# Patient Record
Sex: Male | Born: 1950
Health system: Southern US, Community
[De-identification: ages and names within clinical notes are randomized; demographics above are authoritative.]

## PROBLEM LIST (undated history)

## (undated) DIAGNOSIS — I82409 Acute embolism and thrombosis of unspecified deep veins of unspecified lower extremity: Secondary | ICD-10-CM

## (undated) DIAGNOSIS — I10 Essential (primary) hypertension: Secondary | ICD-10-CM

## (undated) DIAGNOSIS — M1990 Unspecified osteoarthritis, unspecified site: Secondary | ICD-10-CM

## (undated) DIAGNOSIS — I639 Cerebral infarction, unspecified: Secondary | ICD-10-CM

## (undated) DIAGNOSIS — E785 Hyperlipidemia, unspecified: Secondary | ICD-10-CM

## (undated) DIAGNOSIS — I739 Peripheral vascular disease, unspecified: Secondary | ICD-10-CM

## (undated) DIAGNOSIS — I6529 Occlusion and stenosis of unspecified carotid artery: Secondary | ICD-10-CM

## (undated) HISTORY — DX: Occlusion and stenosis of unspecified carotid artery: I65.29

## (undated) HISTORY — PX: HERNIA REPAIR: SHX51

## (undated) HISTORY — DX: Essential (primary) hypertension: I10

## (undated) HISTORY — DX: Cerebral infarction, unspecified: I63.9

---

## 2002-05-01 ENCOUNTER — Ambulatory Visit (HOSPITAL_COMMUNITY): Admission: RE | Admit: 2002-05-01 | Discharge: 2002-05-01 | Payer: Self-pay | Admitting: Gastroenterology

## 2002-07-10 ENCOUNTER — Ambulatory Visit (HOSPITAL_COMMUNITY): Admission: RE | Admit: 2002-07-10 | Discharge: 2002-07-10 | Payer: Self-pay | Admitting: Gastroenterology

## 2002-12-10 ENCOUNTER — Ambulatory Visit (HOSPITAL_COMMUNITY): Admission: RE | Admit: 2002-12-10 | Discharge: 2002-12-10 | Payer: Self-pay | Admitting: Family Medicine

## 2002-12-10 ENCOUNTER — Encounter: Payer: Self-pay | Admitting: Family Medicine

## 2008-07-10 ENCOUNTER — Emergency Department (HOSPITAL_COMMUNITY): Admission: EM | Admit: 2008-07-10 | Discharge: 2008-07-10 | Payer: Self-pay | Admitting: Emergency Medicine

## 2010-08-27 NOTE — Op Note (Signed)
NAME:  Ryan Franklin, Ryan Franklin                        ACCOUNT NO.:  [id number]   MEDICAL RECORD NO.:  [id number]                   PATIENT TYPE:  AMB   LOCATION:  ENDO                                 FACILITY:  [facility]   PHYSICIAN:  Bernette Redbird, M.D.                DATE OF BIRTH:  01/13/1951   DATE OF PROCEDURE:  05/01/2002  DATE OF DISCHARGE:                                 OPERATIVE REPORT   PROCEDURE:  Upper endoscopy with biopsies.   INDICATIONS:  This is a 60 year old gentleman with a several-day history of  melanic stools in association with daily use of small to moderate amounts of  Excedrin.   FINDINGS:  Tiny amount of fresh blood in the stomach with apparent localized  hemorrhagic gastritis.  Small antral ulcer.   DESCRIPTION OF PROCEDURE:  The nature, purpose, and risks of the procedure  had been discussed with the patient, who had also watched our endoscopy  movie at our office before coming over to the endoscopy unit, where I  explained the risks.  Sedation was Demerol 75 mg, Versed 7.5 mg IV, without  arrhythmias or desaturation.  The Olympus small-caliber adult video  endoscope was passed under direct vision, entering the esophagus easily.  The larynx and vocal cords looked normal.   The esophageal mucosa was normal.  There was no evidence of any reflux  esophagitis, Barrett's esophagus, varices, infection, neoplasia, or any  ring, stricture, or Mallory-Weiss tear.  No hiatal hernia was appreciated.  The stomach contained no significant residual blood or coffee-grounds  material, but there was just a little patch of fresh blood measuring about a  centimeter across, which rinsed off the posterior antral wall.  The  underlying mucosa appeared slightly edematous and/or inflamed, possibly  consistent with some localized hemorrhagic gastritis.  However, there was no  reaccumulation of blood following rinsing, so there was no evidence of  active bleeding.   Also in the  antrum of the stomach was a linear slit-like ulcer  longitudinally-oriented in the antrum, measuring about 1 x 10 mm in size.  The base appeared entirely clean without any stigma of hemorrhage.  There  was no diffuse gastritis.  No other ulcers or erosions were observed.  Retroflex viewing of the proximal stomach was unremarkable.   The pylorus, duodenal bulb, and second duodenum looked normal.  Careful  reinspection of the duodenal bulb did not disclose any evidence of ulcers.   Antral biopsies were obtained for CLOtesting prior to removal of the scope.  The patient tolerated the procedure well, and There were no intraoperative  complications.   IMPRESSION:  1. No significant active bleeding at the time of this exam.  2. Possible hemorrhagic gastritis in the antrum of the stomach as described     above.  3. Small linear ulcer in the antrum of the stomach without stigma of     hemorrhage.  PLAN:  Intensive antipeptic therapy for the next five days or so, then  standard PPI therapy.  Follow-up endoscopy in a couple of months could be  considered.                                               Bernette Redbird, M.D.    RB/MEDQ  D:  05/01/2002  T:  05/01/2002  Job:  [id number]   cc:   Holley Bouche, M.D.  [address]  [place], [place] [address]  Fax: [phone]

## 2010-08-27 NOTE — Op Note (Signed)
   NAME:  Ryan Franklin, Ryan Franklin                        ACCOUNT NO.:  [id number]   MEDICAL RECORD NO.:  [id number]                   PATIENT TYPE:  AMB   LOCATION:  ENDO                                 FACILITY:  [facility]   PHYSICIAN:  Bernette Redbird, M.D.                DATE OF BIRTH:  08-20-1950   DATE OF PROCEDURE:  07/10/2002  DATE OF DISCHARGE:                                 OPERATIVE REPORT   PROCEDURE:  Colonoscopy.   INDICATIONS:  Screening for colon cancer in a 60 year old male with no  symptoms.   FINDINGS:  Normal exam to the cecum.   DESCRIPTION OF PROCEDURE:  The nature, purpose, and risks of the procedure  had been discussed with the patient, who provided written consent.  Sedation  for this procedure and the upper endoscopy which preceded it totalled  fentanyl 100 mcg and Versed 9 mg IV without arrhythmias or desaturation.  Digital exam of the prostate was normal.  The Olympus adult video  colonoscope was readily advanced to the cecum as identified by clear  visualization of the appendiceal orifice, and pullback was then performed.  The quality of the prep was excellent with just a little bit of stool film  in the proximal colon, which was largely irrigated away, so it is felt that  all areas were adequately seen.  This was a normal examination.  No polyps,  cancer, colitis, vascular malformations, or diverticulosis were noted, and  retroflexion in the rectum as well as reinspection of the rectosigmoid was  unremarkable.  No biopsies were obtained.  The patient tolerated the  procedure well, and there were no apparent complications.   IMPRESSION:  Normal screening colonoscopy.   PLAN:  Consider repeat exam, either flexible sigmoidoscopy or full  colonoscopy, in about five years.                                               Bernette Redbird, M.D.    RB/MEDQ  D:  07/10/2002  T:  07/10/2002  Job:  [id number]   cc:   Holley Bouche, M.D.  [address]  [place], [place] [address]  Fax: [phone]

## 2010-08-27 NOTE — Consult Note (Signed)
NAME:  Ryan Franklin, Ryan Franklin                        ACCOUNT NO.:  [id number]   MEDICAL RECORD NO.:  [id number]                   PATIENT TYPE:  AMB   LOCATION:  ENDO                                 FACILITY:  [facility]   PHYSICIAN:  Bernette Redbird, M.D.                DATE OF BIRTH:  [birth year]   DATE OF CONSULTATION:  05/01/2002  DATE OF DISCHARGE:                                   CONSULTATION   REASON FOR CONSULTATION:  Dr. Holley Bouche asked [place] to see this 60-year-  old gentleman because of low-grade GI bleeding.   HISTORY:  The patient has no prior history of GI bleeding or peptic ulcer  disease.  He does take Excedrin, typically two to four tablets a day for  headaches.  With that background, he began to notice dark stools several  days ago, perhaps became a little bit lightheaded but no frank syncope or  presyncope, no real nausea or significant abdominal pain.  He presented to  his primary physician's office where melenic stool was apparently noted on  rectal exam, and his hemoglobin was 10.5.  Accordingly, GI consultation was  felt to be clinically appropriate.   ALLERGIES:  No known allergies.   OUTPATIENT MEDICATIONS:  None chronically.  He is finishing out a course of  antibiotics for sinusitis.  He does take Excedrin daily as noted.   OPERATIONS:  Remote appendectomy.   PAST MEDICAL HISTORY:  Chronic medical illnesses:  None.   HABITS:  Nonsmoker.  Occasional ethanol.   FAMILY HISTORY:  Family history negative for gallstones, ulcers, colon  cancer, liver disease.   SOCIAL HISTORY:  Single.  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   REVIEW OF SYSTEMS:  Review of systems not obtained except per HPI.   PHYSICAL EXAMINATION:  GENERAL:  The patient is a healthy-appearing  gentleman in no evident distress, appearing neither anxious nor depressed.  VITAL SIGNS:  Blood pressure 151/101, pulse 95.  SKIN:  He is anicteric.  There is some mild pallor of the skin,  nothing  impressive.  CHEST:  The chest is clear.  HEART:  The heart is normal.  ABDOMEN:  The abdomen is without mass or tenderness but is somewhat adipose.  RECTAL:  Exam was not repeated.  The patient is in no evident distress.   LABORATORY DATA:  Hemoglobin 10.5 at his primary physician's office, as  noted above.    IMPRESSION:  Probable hemorrhagic gastritis or peptic ulcer disease  secondary to aspirin exposure, without evidence of significant active  bleeding clinically at the moment.   PLAN:  Proceed to endoscopic evaluation, with further management to depend  on the findings of that test.  Bernette Redbird, M.D.    RB/MEDQ  D:  05/01/2002  T:  05/01/2002  Job:  [id number]

## 2010-08-27 NOTE — Op Note (Signed)
   NAME:  Ryan Franklin, Ryan Franklin                        ACCOUNT NO.:  [id number]   MEDICAL RECORD NO.:  [id number]                   PATIENT TYPE:  AMB   LOCATION:  ENDO                                 FACILITY:  [facility]   PHYSICIAN:  Bernette Redbird, M.D.                DATE OF BIRTH:  Aug 21, 1950   DATE OF PROCEDURE:  07/10/2002  DATE OF DISCHARGE:                                 OPERATIVE REPORT   PROCEDURE:  Upper endoscopy.   INDICATIONS FOR PROCEDURE:  Followup of gastric ulcer noted when the patient  presented with a melenic GI bleed in January of this year. CLOtest was  negative at that time.   FINDINGS:  Complete resolution of previous gastric ulcer.   DESCRIPTION OF PROCEDURE:  The nature, purpose and risk of the procedure  were familiar to the patient from prior examination who provided written  consent. Sedation for this procedure was fentanyl 100 mcg and Versed 9 mg IV  without arrhythmias or desaturation. The Olympus video endoscope was passed  under direct vision. The vocal chords were not well seen. The esophagus was  readily entered and was normal in its entirety, specifically without  evidence of reflux esophagitis, Barrett's esophagus, free reflux, varices,  infection or neoplasia. No rings, stricture or hiatal hernia was  appreciated.   The stomach contained no significant residual and had normal mucosa,  specifically with complete healing of the previous antral ulcer, with no  scarring or other abnormalities appreciated. No gastritis, erosions, ulcers,  polyps or masses were noted anywhere in the stomach and a retroflexed view  of the proximal stomach was unremarkable. The pylorus, duodenal bulb and  second duodenum also looked normal.   The scope was removed from to who tolerated the procedure well and without  any apparent complications. No biopsies were obtained.   IMPRESSION:  Normal upper endoscopy. Resolution of previous gastric ulcer.   PLAN:  No indication  for resumption of PPI therapy unless the patient needs  to go on aspirin or NSAIDs in the future for any sustained period of time.                                               Bernette Redbird, M.D.    RB/MEDQ  D:  07/10/2002  T:  07/10/2002  Job:  [id number]   cc:   Holley Bouche, M.D.  [address]  [place], [place] [address]  Fax: [phone]

## 2018-06-08 DIAGNOSIS — R030 Elevated blood-pressure reading, without diagnosis of hypertension: Secondary | ICD-10-CM | POA: Diagnosis not present

## 2018-06-08 DIAGNOSIS — I499 Cardiac arrhythmia, unspecified: Secondary | ICD-10-CM | POA: Diagnosis not present

## 2018-06-08 DIAGNOSIS — R9431 Abnormal electrocardiogram [ECG] [EKG]: Secondary | ICD-10-CM | POA: Diagnosis not present

## 2018-06-08 DIAGNOSIS — R05 Cough: Secondary | ICD-10-CM | POA: Diagnosis not present

## 2018-06-08 DIAGNOSIS — H00011 Hordeolum externum right upper eyelid: Secondary | ICD-10-CM | POA: Diagnosis not present

## 2018-06-08 DIAGNOSIS — H579 Unspecified disorder of eye and adnexa: Secondary | ICD-10-CM | POA: Diagnosis not present

## 2018-08-15 ENCOUNTER — Other Ambulatory Visit: Payer: Self-pay

## 2018-08-15 ENCOUNTER — Emergency Department (HOSPITAL_COMMUNITY)
Admission: EM | Admit: 2018-08-15 | Discharge: 2018-08-15 | Disposition: A | Discharge status: Home / Self Care | Payer: Medicare HMO | Attending: Emergency Medicine | Admitting: Emergency Medicine

## 2018-08-15 ENCOUNTER — Encounter (HOSPITAL_COMMUNITY): Payer: Self-pay | Admitting: Emergency Medicine

## 2018-08-15 ENCOUNTER — Emergency Department (HOSPITAL_COMMUNITY): Payer: Medicare HMO

## 2018-08-15 DIAGNOSIS — R519 Headache, unspecified: Secondary | ICD-10-CM

## 2018-08-15 DIAGNOSIS — R51 Headache: Secondary | ICD-10-CM | POA: Diagnosis not present

## 2018-08-15 DIAGNOSIS — R030 Elevated blood-pressure reading, without diagnosis of hypertension: Secondary | ICD-10-CM

## 2018-08-15 LAB — CBC WITH DIFFERENTIAL/PLATELET
Abs Immature Granulocytes: 0.01 10*3/uL (ref 0.00–0.07)
Basophils Absolute: 0 10*3/uL (ref 0.0–0.1)
Basophils Relative: 1 %
Eosinophils Absolute: 0.2 10*3/uL (ref 0.0–0.5)
Eosinophils Relative: 4 %
HCT: 51.6 % (ref 39.0–52.0)
Hemoglobin: 17 g/dL (ref 13.0–17.0)
Immature Granulocytes: 0 %
Lymphocytes Relative: 39 %
Lymphs Abs: 2.2 10*3/uL (ref 0.7–4.0)
MCH: 28.5 pg (ref 26.0–34.0)
MCHC: 32.9 g/dL (ref 30.0–36.0)
MCV: 86.6 fL (ref 80.0–100.0)
Monocytes Absolute: 0.8 10*3/uL (ref 0.1–1.0)
Monocytes Relative: 15 %
Neutro Abs: 2.4 10*3/uL (ref 1.7–7.7)
Neutrophils Relative %: 41 %
Platelets: 250 10*3/uL (ref 150–400)
RBC: 5.96 MIL/uL — ABNORMAL HIGH (ref 4.22–5.81)
RDW: 15.1 % (ref 11.5–15.5)
WBC: 5.6 10*3/uL (ref 4.0–10.5)
nRBC: 0 % (ref 0.0–0.2)

## 2018-08-15 LAB — BASIC METABOLIC PANEL
Anion gap: 12 (ref 5–15)
BUN: 15 mg/dL (ref 8–23)
CO2: 22 mmol/L (ref 22–32)
Calcium: 9.8 mg/dL (ref 8.9–10.3)
Chloride: 104 mmol/L (ref 98–111)
Creatinine, Ser: 0.93 mg/dL (ref 0.61–1.24)
GFR calc Af Amer: >60 mL/min (ref >60)
GFR calc non Af Amer: >60 mL/min (ref >60)
Glucose, Bld: 88 mg/dL (ref 70–99)
Potassium: 3.9 mmol/L (ref 3.5–5.1)
Sodium: 138 mmol/L (ref 135–145)

## 2018-08-15 MED ORDER — AMLODIPINE BESYLATE 5 MG PO TABS
10.0000 mg | ORAL_TABLET | Freq: Once | ORAL | Status: AC
  Administered 2018-08-15: 20:00:00 10 mg via ORAL
  Filled 2018-08-15: qty 2

## 2018-08-15 MED ORDER — METOCLOPRAMIDE HCL 5 MG/ML IJ SOLN
10.0000 mg | Freq: Once | INTRAMUSCULAR | Status: AC
  Administered 2018-08-15: 20:00:00 10 mg via INTRAVENOUS
  Filled 2018-08-15: qty 2

## 2018-08-15 MED ORDER — LABETALOL HCL 5 MG/ML IV SOLN
10.0000 mg | Freq: Once | INTRAVENOUS | Status: AC
  Administered 2018-08-15: 18:00:00 10 mg via INTRAVENOUS
  Filled 2018-08-15: qty 4

## 2018-08-15 MED ORDER — KETOROLAC TROMETHAMINE 30 MG/ML IJ SOLN
15.0000 mg | Freq: Once | INTRAMUSCULAR | Status: AC
  Administered 2018-08-15: 20:00:00 15 mg via INTRAVENOUS
  Filled 2018-08-15: qty 1

## 2018-08-15 MED ORDER — DIPHENHYDRAMINE HCL 50 MG/ML IJ SOLN
50.0000 mg | Freq: Once | INTRAMUSCULAR | Status: AC
  Administered 2018-08-15: 50 mg via INTRAVENOUS
  Filled 2018-08-15: qty 1

## 2018-08-15 MED ORDER — AMLODIPINE BESYLATE 5 MG PO TABS: 5.0000 mg | ORAL_TABLET | Freq: Every day | ORAL | 0 refills | Status: AC

## 2018-08-15 MED ORDER — METOCLOPRAMIDE HCL 5 MG/ML IJ SOLN
10.0000 mg | Freq: Once | INTRAMUSCULAR | Status: AC
  Administered 2018-08-15: 18:00:00 10 mg via INTRAVENOUS
  Filled 2018-08-15: qty 2

## 2018-08-15 NOTE — Discharge Instructions (Signed)
You were seen in the ED for elevated blood pressure readings and headache.  Lab work is normal.  Head CT is normal.  It is unclear still why your blood pressure was elevated today compared to previous several weeks.  Headache/pain may have contributed.  We will start you on a low-dose blood pressure medicine, 5 mg of amlodipine daily.  Continue checking your blood pressure daily and keep a log.  Follow-up with your primary care doctor next month for recheck of your blood pressure and possible med changes.  Return to the ED for severe, sudden headache, vision changes or loss, chest pain, shortness of breath, lower extremity swelling, difficulty breathing when laying flat, one-sided weakness or numbness, difficulty with speech gait or balance

## 2018-08-15 NOTE — ED Notes (Signed)
Patient verbalizes understanding of discharge instructions. Opportunity for questioning and answers were provided. Armband removed by staff, pt discharged from ED ambulatory.   

## 2018-08-15 NOTE — ED Provider Notes (Signed)
[facility] EMERGENCY DEPARTMENT Provider Note   CSN: [id number] Arrival date & time: 08/15/18  1610    History   Chief Complaint Chief Complaint  Patient presents with  . Hypertension  . Headache    HPI Ryan Franklin is a 69 y.o. male is here for evaluation of elevated blood pressure reading checked this morning SBP in the 160s.  For the last 2 months he has been checking his BP every morning and ranges are SBP 116-140 and DBP 82-96.  Patient has a log of his BPs for the last 2 months.  States he woke up this morning with a "mild" headache located behind bilateral eyes like a pressure.  He then checked his blood pressure and it was higher than it has been in the past.  He took Bayer aspirin without significant improvement in his headache.  Is BP 203/117 on arrival.  States several weeks ago he had some difficulty swallowing so he [facility] where they got an EKG and told him it was "abnormal" but he does not remember the details.  He denies any previous diagnoses or medications for hypertension.  He denies illicit drug use specifically cocaine.  He drinks 3 cups of coffee daily.  He denies vision changes, vomiting, chest pain, shortness of breath, orthopnea, lower extremity swelling, unilateral weakness.     HPI  History reviewed. No pertinent past medical history.  There are no active problems to display for this patient.   History reviewed. No pertinent surgical history.      Home Medications    Prior to Admission medications   Medication Sig Start Date End Date Taking? Authorizing Provider  amLODipine (NORVASC) 5 MG tablet Take 1 tablet (5 mg total) by mouth daily for 30 days. 08/15/18 09/14/18  Liberty Handy, PA-C    Family History No family history on file.  Social History Social History   Tobacco Use  . Smoking status: Never Smoker  . Smokeless tobacco: Never Used  Substance Use Topics  . Alcohol use: Never    Frequency:  Never  . Drug use: Never     Allergies   Patient has no allergy information on record.   Review of Systems Review of Systems  Neurological: Positive for headaches.  All other systems reviewed and are negative.    Physical Exam Updated Vital Signs BP (!) 175/108   Pulse 72   Temp 98.7 F (37.1 C) (Oral)   Resp 10   SpO2 96%   Physical Exam Vitals signs and nursing note reviewed.  Constitutional:      General: He is not in acute distress.    Appearance: He is well-developed.     Comments: NAD.  HENT:     Head: Normocephalic and atraumatic.     Right Ear: External ear normal.     Left Ear: External ear normal.     Nose: Nose normal.  Eyes:     General: No scleral icterus.    Conjunctiva/sclera: Conjunctivae normal.  Neck:     Musculoskeletal: Normal range of motion and neck supple.  Cardiovascular:     Rate and Rhythm: Normal rate and regular rhythm.     Heart sounds: Normal heart sounds. No murmur.     Comments: 1+ radial and DP pulses bilaterally.  No lower extremity edema.  No calf tenderness. Pulmonary:     Effort: Pulmonary effort is normal.     Breath sounds: Normal breath sounds. No wheezing.  Musculoskeletal: Normal range of motion.        General: No deformity.  Skin:    General: Skin is warm and dry.     Capillary Refill: Capillary refill takes less than 2 seconds.  Neurological:     Mental Status: He is alert and oriented to person, place, and time.     Comments: Alert and oriented to self, place, time and event.  Speech is fluent without dysarthria or dysphasia. Strength 5/5 with hand grip and ankle F/E.   Sensation to light touch intact in face, hands and feet. Normal gait No pronator drift. No leg drop. Normal finger-to-nose and feet tapping.  CN I not tested CN II grossly intact visual fields bilaterally. Unable to visualize posterior eye. CN III, IV, VI PEERL and EOMs intact bilaterally CN V light touch intact in all 3 divisions of  trigeminal nerve CN VII facial movements symmetric CN VIII not tested CN IX, X no uvula deviation, symmetric rise of soft palate  CN XI 5/5 SCM and trapezius strength bilaterally  CN XII Midline tongue protrusion, symmetric L/R movements  Psychiatric:        Behavior: Behavior normal.        Thought Content: Thought content normal.        Judgment: Judgment normal.      ED Treatments / Results  Labs (all labs ordered are listed, but only abnormal results are displayed) Labs Reviewed  CBC WITH DIFFERENTIAL/PLATELET - Abnormal; Notable for the following components:      Result Value   RBC 5.96 (*)    All other components within normal limits  BASIC METABOLIC PANEL    EKG EKG Interpretation  Date/Time:  Wednesday Aug 15 2018 17:50:07 EDT Ventricular Rate:  84 PR Interval:    QRS Duration: 94 QT Interval:  371 QTC Calculation: 439 R Axis:   -41 Text Interpretation:  Sinus rhythm Left axis deviation Anterior infarct, old Confirmed by Tilden Fossaees, Elizabeth [id number]) on 08/15/2018 7:18:34 PM   Radiology Ct Head Wo Contrast  Result Date: 08/15/2018 CLINICAL DATA:  Elevated BP, headaches EXAM: CT HEAD WITHOUT CONTRAST TECHNIQUE: Contiguous axial images were obtained from the base of the skull through the vertex without intravenous contrast. COMPARISON:  None. FINDINGS: Brain: Calcification in the right basal ganglia. Mild cerebral atrophy. No acute intracranial abnormality. Specifically, no hemorrhage, hydrocephalus, mass lesion, acute infarction, or significant intracranial injury. Vascular: No hyperdense vessel or unexpected calcification. Skull: No acute calvarial abnormality. Sinuses/Orbits: Visualized paranasal sinuses and mastoids clear. Orbital soft tissues unremarkable. Other: None IMPRESSION: No acute intracranial abnormality. Electronically Signed   By: Charlett NoseKevin  Dover M.D.   On: 08/15/2018 19:26    Procedures Procedures (including critical care time)  Medications Ordered in ED  Medications  metoCLOPramide (REGLAN) injection 10 mg (10 mg Intravenous Given 08/15/18 1822)  labetalol (NORMODYNE) injection 10 mg (10 mg Intravenous Given 08/15/18 1825)  amLODipine (NORVASC) tablet 10 mg (10 mg Oral Given 08/15/18 1939)  diphenhydrAMINE (BENADRYL) injection 50 mg (50 mg Intravenous Given 08/15/18 2021)  metoCLOPramide (REGLAN) injection 10 mg (10 mg Intravenous Given 08/15/18 2021)  ketorolac (TORADOL) 30 MG/ML injection 15 mg (15 mg Intravenous Given 08/15/18 2021)     Initial Impression / Assessment and Plan / ED Course  I have reviewed the triage vital signs and the nursing notes.  Pertinent labs & imaging results that were available during my care of the patient were reviewed by me and considered in my medical decision making (see chart for  details).  Clinical Course as of Aug 15 2122  Wed Aug 15, 2018  2000 Re-evaluated patient. No improvement in HA still behind eyes "mild" 3/10.  Will redose migraine cocktail. Some improvement in BP   [CG]    Clinical Course User Index [CG] Liberty Handy, PA-C      Hypertension with mild headache. Neuro intact. Denies sympathomimetic drug use. No previous dx of HTN.  No other clinical features to suggest end organ dysfunction such as: stroke symptoms, vision changes, CP, SOB, cough, orthopnea, LE edema, back/abdominal pain, hematuria. Given marked elevated BP and headache will obtain labs, EKG, CT head.  Treat headache. Reassess.    1847: Creatinine WNL. Labetalol and reglan ordered. Pending CT head.   2122: HA improved with migraine cocktail. BP improved.  No further indication for aggressive BP control or further emergent work up.  Will dc with f/u with low dose amlodipine, BP log and f/u with PCP for prn med adjustment. Return precautions given. Discussed with EDMD.   Final Clinical Impressions(s) / ED Diagnoses   Final diagnoses:  Elevated blood pressure reading  Headache around the eyes    ED Discharge Orders          Ordered    amLODipine (NORVASC) 5 MG tablet  Daily     08/15/18 2120           Jerrell Mylar 08/15/18 2124    Tilden Fossa, MD 08/17/18 [phone]

## 2018-08-15 NOTE — ED Triage Notes (Signed)
C/o blood pressure elevated at home today. (160/-)  Reports "slight" headache since waking up this morning.  No weakness/numbness.  No arm drift.  Not currently taking BP medications.

## 2018-08-15 NOTE — ED Notes (Signed)
ED Provider, Claudia at bedside. 

## 2018-09-14 DIAGNOSIS — E7800 Pure hypercholesterolemia, unspecified: Secondary | ICD-10-CM | POA: Diagnosis not present

## 2018-09-14 DIAGNOSIS — I10 Essential (primary) hypertension: Secondary | ICD-10-CM | POA: Diagnosis not present

## 2018-09-14 DIAGNOSIS — Z1211 Encounter for screening for malignant neoplasm of colon: Secondary | ICD-10-CM | POA: Diagnosis not present

## 2018-11-27 DIAGNOSIS — K5730 Diverticulosis of large intestine without perforation or abscess without bleeding: Secondary | ICD-10-CM | POA: Diagnosis not present

## 2018-11-27 DIAGNOSIS — Z1211 Encounter for screening for malignant neoplasm of colon: Secondary | ICD-10-CM | POA: Diagnosis not present

## 2018-12-10 DIAGNOSIS — Z1212 Encounter for screening for malignant neoplasm of rectum: Secondary | ICD-10-CM | POA: Diagnosis not present

## 2018-12-10 DIAGNOSIS — Z1211 Encounter for screening for malignant neoplasm of colon: Secondary | ICD-10-CM | POA: Diagnosis not present

## 2019-01-14 DIAGNOSIS — Z23 Encounter for immunization: Secondary | ICD-10-CM | POA: Diagnosis not present

## 2019-01-14 DIAGNOSIS — K219 Gastro-esophageal reflux disease without esophagitis: Secondary | ICD-10-CM | POA: Diagnosis not present

## 2019-01-14 DIAGNOSIS — Z0000 Encounter for general adult medical examination without abnormal findings: Secondary | ICD-10-CM | POA: Diagnosis not present

## 2019-01-14 DIAGNOSIS — I10 Essential (primary) hypertension: Secondary | ICD-10-CM | POA: Diagnosis not present

## 2019-01-14 DIAGNOSIS — Z1159 Encounter for screening for other viral diseases: Secondary | ICD-10-CM | POA: Diagnosis not present

## 2019-01-14 DIAGNOSIS — Z125 Encounter for screening for malignant neoplasm of prostate: Secondary | ICD-10-CM | POA: Diagnosis not present

## 2019-01-14 DIAGNOSIS — E7800 Pure hypercholesterolemia, unspecified: Secondary | ICD-10-CM | POA: Diagnosis not present

## 2019-04-17 DIAGNOSIS — K219 Gastro-esophageal reflux disease without esophagitis: Secondary | ICD-10-CM | POA: Diagnosis not present

## 2019-04-17 DIAGNOSIS — H6123 Impacted cerumen, bilateral: Secondary | ICD-10-CM | POA: Diagnosis not present

## 2019-04-17 DIAGNOSIS — R490 Dysphonia: Secondary | ICD-10-CM | POA: Diagnosis not present

## 2019-04-19 DIAGNOSIS — K4090 Unilateral inguinal hernia, without obstruction or gangrene, not specified as recurrent: Secondary | ICD-10-CM | POA: Diagnosis not present

## 2019-05-16 DIAGNOSIS — H524 Presbyopia: Secondary | ICD-10-CM | POA: Diagnosis not present

## 2019-05-17 DIAGNOSIS — Z01818 Encounter for other preprocedural examination: Secondary | ICD-10-CM | POA: Diagnosis not present

## 2019-05-21 DIAGNOSIS — K4090 Unilateral inguinal hernia, without obstruction or gangrene, not specified as recurrent: Secondary | ICD-10-CM | POA: Diagnosis not present

## 2019-06-04 DIAGNOSIS — R490 Dysphonia: Secondary | ICD-10-CM | POA: Diagnosis not present

## 2019-06-04 DIAGNOSIS — K219 Gastro-esophageal reflux disease without esophagitis: Secondary | ICD-10-CM | POA: Diagnosis not present

## 2019-06-13 ENCOUNTER — Ambulatory Visit: Payer: Medicare HMO | Attending: Internal Medicine

## 2019-06-13 DIAGNOSIS — Z23 Encounter for immunization: Secondary | ICD-10-CM | POA: Insufficient documentation

## 2019-06-13 NOTE — Progress Notes (Signed)
   Covid-19 Vaccination Clinic  Name:  Ryan Franklin    MRN: [id number] DOB: April 30, 1950  06/13/2019  Ryan Franklin was observed post Covid-19 immunization for 15 minutes without incident. He was provided with Vaccine Information Sheet and instruction to access the V-Safe system.   Ryan Franklin was instructed to call 911 with any severe reactions post vaccine: Marland Kitchen Difficulty breathing  . Swelling of face and throat  . A fast heartbeat  . A bad rash all over body  . Dizziness and weakness

## 2019-07-09 ENCOUNTER — Ambulatory Visit: Payer: Medicare HMO | Attending: Internal Medicine

## 2019-07-09 DIAGNOSIS — Z23 Encounter for immunization: Secondary | ICD-10-CM

## 2019-07-09 NOTE — Progress Notes (Signed)
   Covid-19 Vaccination Clinic  Name:  GRAYLAND DAISEY    MRN: [id number] DOB: 1950-08-24  07/09/2019  Mr. Pinn was observed post Covid-19 immunization for 15 minutes without incident. He was provided with Vaccine Information Sheet and instruction to access the V-Safe system.   Mr. Edgerly was instructed to call 911 with any severe reactions post vaccine: Marland Kitchen Difficulty breathing  . Swelling of face and throat  . A fast heartbeat  . A bad rash all over body  . Dizziness and weakness   Immunizations Administered    Name Date Dose VIS Date Route   Pfizer COVID-19 Vaccine 07/09/2019  3:46 PM 0.3 mL 03/22/2019 Intramuscular   Manufacturer: [employer], [employer]   Lot: [id number]   NDC: [address]-4

## 2019-10-07 DIAGNOSIS — I10 Essential (primary) hypertension: Secondary | ICD-10-CM | POA: Diagnosis not present

## 2019-10-07 DIAGNOSIS — K219 Gastro-esophageal reflux disease without esophagitis: Secondary | ICD-10-CM | POA: Diagnosis not present

## 2019-10-07 DIAGNOSIS — G4489 Other headache syndrome: Secondary | ICD-10-CM | POA: Diagnosis not present

## 2019-10-07 DIAGNOSIS — S93492A Sprain of other ligament of left ankle, initial encounter: Secondary | ICD-10-CM | POA: Diagnosis not present

## 2019-10-07 DIAGNOSIS — Z125 Encounter for screening for malignant neoplasm of prostate: Secondary | ICD-10-CM | POA: Diagnosis not present

## 2019-10-07 DIAGNOSIS — E7800 Pure hypercholesterolemia, unspecified: Secondary | ICD-10-CM | POA: Diagnosis not present

## 2019-10-18 ENCOUNTER — Other Ambulatory Visit: Payer: Self-pay | Admitting: Family Medicine

## 2019-10-18 DIAGNOSIS — G4489 Other headache syndrome: Secondary | ICD-10-CM

## 2019-10-24 ENCOUNTER — Ambulatory Visit
Admission: RE | Admit: 2019-10-24 | Discharge: 2019-10-24 | Disposition: A | Discharge status: Home / Self Care | Payer: Medicare HMO | Source: Ambulatory Visit | Attending: Family Medicine | Admitting: Family Medicine

## 2019-10-24 DIAGNOSIS — R519 Headache, unspecified: Secondary | ICD-10-CM | POA: Diagnosis not present

## 2019-10-24 DIAGNOSIS — G4489 Other headache syndrome: Secondary | ICD-10-CM

## 2019-10-29 ENCOUNTER — Encounter: Payer: Self-pay | Admitting: Neurology

## 2019-11-01 ENCOUNTER — Other Ambulatory Visit: Payer: Medicare HMO

## 2019-11-07 NOTE — Progress Notes (Signed)
NEUROLOGY CONSULTATION NOTE  Ryan Franklin MRN: [id number] DOB: Sep 26, 1950  Referring provider: [name], MD Primary care provider: [facility]  Reason for consult:  headache  HISTORY OF PRESENT ILLNESS: Ryan Franklin. Helsley is a 69 year old right-handed male with HTN and PUD with history of GI bleed who presents for headaches.  History supplemented by referring provider's note.  CT head personally reviewed.  He began having new daily near-persistent headaches several months ago.  They are usually a severe right-sided non-throbbing pain.  They sometimes wake him up in the middle of the night.  Sometimes he is bed-bound.  On rare occasions, he has noted brief flashes in his left eye.  There is no associated nausea, vomiting, photophobia, phonophobia, osmophobia, speech disturbance, dizziness, numbness or weakness.  He has some neck tightness.  He was initially taking ibuprofen and ASA daily, but later was changed to tramadol, which he rarely takes (only if needed) but does seem to dull the pain.  No specific triggers.  He does have a history of different dull frontal headaches and ocular migraines (kaleidoscopic vision, no headache).  10/07/2019 LABS:  Sed rate 13, TSH 4.40.  CBC and CMP unremarkable. 10/25/2019 CT HEAD WO:  Cortical atrophy but no acute intracranial abnormality.   Current NSAIDS:  none Current analgesics:  tramadol Current triptans:  none Current ergotamine:  none Current anti-emetic:  none Current muscle relaxants:  none Current anti-anxiolytic:  none Current sleep aide:  none Current Antihypertensive medications:  amlodipine Current Antidepressant medications:  none Current Anticonvulsant medications:  none Current anti-CGRP:  none Current Vitamins/Herbal/Supplements:  MVI, magesium 500mg , C, D Current Antihistamines/Decongestants:  none Other therapy:  none Hormone/birth control:  none  Past NSAIDS/steroid:  ASA, ibuprofen,  prednisone Past analgesics:  none Past abortive triptans:  none Past abortive ergotamine:  none Past muscle relaxants:  none Past anti-emetic:  none Past antihypertensive medications:  none Past antidepressant medications:  none Past anticonvulsant medications:  none Past anti-CGRP:  none Past vitamins/Herbal/Supplements:  none Past antihistamines/decongestants:  none Other past therapies:  none    PAST MEDICAL HISTORY: Past Medical History:  Diagnosis Date  . Hypertension     PAST SURGICAL HISTORY: History reviewed. No pertinent surgical history.  MEDICATIONS: Current Outpatient Medications on File Prior to Visit  Medication Sig Dispense Refill  . amLODipine (NORVASC) 5 MG tablet Take 1 tablet (5 mg total) by mouth daily for 30 days. 30 tablet 0   No current facility-administered medications on file prior to visit.    ALLERGIES: Not on File   FAMILY HISTORY: History reviewed. No pertinent family history.  SOCIAL HISTORY: Social History   Socioeconomic History  . Marital status: Single    Spouse name: Not on file  . Number of children: Not on file  . Years of education: Not on file  . Highest education level: Not on file  Occupational History  . Not on file  Tobacco Use  . Smoking status: Never Smoker  . Smokeless tobacco: Never Used  Substance and Sexual Activity  . Alcohol use: Never  . Drug use: Never  . Sexual activity: Not on file  Other Topics Concern  . Not on file  Social History Narrative  . Not on file   Social Determinants of Health   Financial Resource Strain:   . Difficulty of Paying Living Expenses:   Food Insecurity:   . Worried About in the Last Year:   . Ran  Out of Food in the Last Year:   Transportation Needs:   . Lack of Transportation (Medical):   Marland Kitchen Lack of Transportation (Non-Medical):   Physical Activity:   . Days of Exercise per Week:   . Minutes of Exercise per Session:   Stress:   . Feeling of  Stress :   Social Connections:   . Frequency of Communication with Friends and Family:   . Frequency of Social Gatherings with Friends and Family:   . Attends Religious Services:   . Active Member of Clubs or Organizations:   . Attends Banker Meetings:   Marland Kitchen Marital Status:   Intimate Partner Violence:   . Fear of Current or Ex-Partner:   . Emotionally Abused:   Marland Kitchen Physically Abused:   . Sexually Abused:     PHYSICAL EXAM: Blood pressure (!) 146/98, pulse 79, height 5\' 10"  (1.778 m), weight (!) 224 lb (101.6 kg), SpO2 93 %. General: No acute distress.  Patient appears well-groomed.  Head:  Normocephalic/atraumatic Eyes:  fundi examined but not visualized Neck: supple, no paraspinal tenderness, full range of motion Back: No paraspinal tenderness Heart: regular rate and rhythm Lungs: Clear to auscultation bilaterally. Vascular: No carotid bruits. Neurological Exam: Mental status: alert and oriented to person, place, and time, recent and remote memory intact, fund of knowledge intact, attention and concentration intact, speech fluent and not dysarthric, language intact. Cranial nerves: CN I: not tested CN II: pupils equal, round and reactive to light, visual fields intact CN III, IV, VI:  full range of motion, no nystagmus, no ptosis CN V: facial sensation intact CN VII: upper and lower face symmetric CN VIII: hearing intact CN IX, X: gag intact, uvula midline CN XI: sternocleidomastoid and trapezius muscles intact CN XII: tongue midline Bulk & Tone: normal, no fasciculations. Motor:  5/5 throughout  Sensation:  Pinprick and vibration sensation intact. Deep Tendon Reflexes:  2+ throughout, toes downgoing.  Finger to nose testing:  Without dysmetria.  Heel to shin:  Without dysmetria.  Gait:  Normal station and stride.  Romberg negative.  IMPRESSION: New daily persistent headache, likely migraine  PLAN: 1.  MRI and MRA of head.  I would like to rule out a  secondary intracranial etiology that may be missed on CT. 2.  Start nortriptyline 10mg  at bedtime for one week, then 20mg  at bedtime.  We can increase dose in 5 weeks if needed. 3.  I will have him try Ubrelvy 100mg  if needed. Samples provided and if effective, will send prescription.  I would rather have him avoid tramadol if taking nortriptyline and due to his age, I would prefer to avoid triptans. 4.  Limit use of pain relievers to no more than 2 days out of week to prevent risk of rebound or medication-overuse headache. 5.  Keep headache diary 6.  Follow up in 4 to 6 months.  Thank you for allowing me to take part in the care of this patient.  , DO  CC: , MD

## 2019-11-08 ENCOUNTER — Other Ambulatory Visit: Payer: Self-pay

## 2019-11-08 ENCOUNTER — Ambulatory Visit: Payer: Medicare HMO | Admitting: Neurology

## 2019-11-08 ENCOUNTER — Encounter: Payer: Self-pay | Admitting: Neurology

## 2019-11-08 VITALS — BP 146/98 | HR 79 | Ht 70.0 in | Wt 224.0 lb

## 2019-11-08 DIAGNOSIS — G4452 New daily persistent headache (NDPH): Secondary | ICD-10-CM

## 2019-11-08 MED ORDER — NORTRIPTYLINE HCL 10 MG PO CAPS: ORAL_CAPSULE | ORAL | 0 refills | Status: DC

## 2019-11-08 NOTE — Patient Instructions (Signed)
1.  Will check MRI and MRA of head 2.  Start nortriptyline 10mg  at bedtime for one week, then increase to 20mg  at bedtime.  Contact me for refill and update and we can increase dose if needed. 3.  Stop tramadol.  Instead, try Ubrelvy 100mg .  May repeat in 2 hours if needed.  If effective, will try to get it approved 4.  Keep headache diary 5.  Follow up in 6 months.

## 2019-11-15 ENCOUNTER — Other Ambulatory Visit: Payer: Self-pay

## 2019-11-15 ENCOUNTER — Telehealth: Payer: Self-pay | Admitting: Neurology

## 2019-11-15 DIAGNOSIS — G4452 New daily persistent headache (NDPH): Secondary | ICD-10-CM

## 2019-11-15 NOTE — Telephone Encounter (Signed)
Telephone call to pt, LMOVM given [facility] phone number.

## 2019-11-15 NOTE — Telephone Encounter (Signed)
Patient called in stating Dr. Everlena Cooper mentioned in his appointment about having an MRI. He was calling to see if it was scheduled. I didn't see anything in the system since his appointment on 11/08/19.

## 2019-12-09 ENCOUNTER — Other Ambulatory Visit: Payer: Self-pay | Admitting: Neurology

## 2019-12-10 ENCOUNTER — Other Ambulatory Visit: Payer: Self-pay

## 2019-12-10 ENCOUNTER — Ambulatory Visit
Admission: RE | Admit: 2019-12-10 | Discharge: 2019-12-10 | Disposition: A | Discharge status: Home / Self Care | Payer: Medicare HMO | Source: Ambulatory Visit | Attending: Neurology | Admitting: Neurology

## 2019-12-10 DIAGNOSIS — G4452 New daily persistent headache (NDPH): Secondary | ICD-10-CM

## 2019-12-10 DIAGNOSIS — R519 Headache, unspecified: Secondary | ICD-10-CM | POA: Diagnosis not present

## 2019-12-10 MED ORDER — GADOBENATE DIMEGLUMINE 529 MG/ML IV SOLN
20.0000 mL | Freq: Once | INTRAVENOUS | Status: AC | PRN
  Administered 2019-12-10: 20 mL via INTRAVENOUS

## 2019-12-11 ENCOUNTER — Telehealth: Payer: Self-pay

## 2019-12-11 ENCOUNTER — Other Ambulatory Visit: Payer: Self-pay

## 2019-12-11 DIAGNOSIS — R9089 Other abnormal findings on diagnostic imaging of central nervous system: Secondary | ICD-10-CM

## 2019-12-11 NOTE — Progress Notes (Signed)
Per DR. Jaffe: I would also like to repeat MRI of brain with and without contrast in 6 months to follow up on MRI findings. He should follow up with me afterwards.  I would like to check a vitamin B1 level.

## 2019-12-13 NOTE — Telephone Encounter (Signed)
Patient called and said, "I thought Dr. Everlena Cooper was going to increase my nortriptyline but the pharmacy doesn't have a new prescription for me. I currently take 2-10 MG capsules at bedtime."  [facility] in [place]

## 2019-12-24 ENCOUNTER — Other Ambulatory Visit (INDEPENDENT_AMBULATORY_CARE_PROVIDER_SITE_OTHER): Payer: Medicare HMO

## 2019-12-24 ENCOUNTER — Other Ambulatory Visit: Payer: Self-pay

## 2019-12-24 DIAGNOSIS — R9089 Other abnormal findings on diagnostic imaging of central nervous system: Secondary | ICD-10-CM

## 2019-12-29 LAB — VITAMIN B1: Vitamin B1 (Thiamine): 7 nmol/L — ABNORMAL LOW (ref 8–30)

## 2019-12-31 ENCOUNTER — Telehealth: Payer: Self-pay

## 2019-12-31 NOTE — Telephone Encounter (Signed)
Telephone call to pt, advised of his B1 results. And to start a OTC b1

## 2019-12-31 NOTE — Telephone Encounter (Signed)
-----   Message from Drema Dallas, DO sent at 12/30/2019 10:47 AM EDT ----- B1 level is borderline low.  Recommend starting OTC thiamine 100mg  daily.

## 2020-01-06 ENCOUNTER — Other Ambulatory Visit: Payer: Self-pay | Admitting: Neurology

## 2020-01-16 DIAGNOSIS — G4489 Other headache syndrome: Secondary | ICD-10-CM | POA: Diagnosis not present

## 2020-01-16 DIAGNOSIS — Z23 Encounter for immunization: Secondary | ICD-10-CM | POA: Diagnosis not present

## 2020-01-16 DIAGNOSIS — I10 Essential (primary) hypertension: Secondary | ICD-10-CM | POA: Diagnosis not present

## 2020-01-16 DIAGNOSIS — E7800 Pure hypercholesterolemia, unspecified: Secondary | ICD-10-CM | POA: Diagnosis not present

## 2020-01-16 DIAGNOSIS — Z0000 Encounter for general adult medical examination without abnormal findings: Secondary | ICD-10-CM | POA: Diagnosis not present

## 2020-01-25 ENCOUNTER — Ambulatory Visit: Payer: Medicare HMO | Attending: Internal Medicine

## 2020-01-25 ENCOUNTER — Ambulatory Visit: Payer: Medicare HMO

## 2020-01-25 DIAGNOSIS — Z23 Encounter for immunization: Secondary | ICD-10-CM

## 2020-01-25 NOTE — Progress Notes (Signed)
   Covid-19 Vaccination Clinic  Name:  SEQUOIA MINCEY    MRN: [id number] DOB: 1951-02-25  01/25/2020  Mr. Longshore was observed post Covid-19 immunization for 15 minutes without incident. He was provided with Vaccine Information Sheet and instruction to access the V-Safe system.   Mr. Hoar was instructed to call 911 with any severe reactions post vaccine: Marland Kitchen Difficulty breathing  . Swelling of face and throat  . A fast heartbeat  . A bad rash all over body  . Dizziness and weakness

## 2020-02-05 ENCOUNTER — Other Ambulatory Visit: Payer: Self-pay | Admitting: Neurology

## 2020-02-08 ENCOUNTER — Ambulatory Visit: Payer: Medicare HMO

## 2020-02-10 DIAGNOSIS — Z03818 Encounter for observation for suspected exposure to other biological agents ruled out: Secondary | ICD-10-CM | POA: Diagnosis not present

## 2020-02-10 DIAGNOSIS — Z20822 Contact with and (suspected) exposure to covid-19: Secondary | ICD-10-CM | POA: Diagnosis not present

## 2020-04-15 DIAGNOSIS — U071 COVID-19: Secondary | ICD-10-CM | POA: Diagnosis not present

## 2020-04-28 ENCOUNTER — Other Ambulatory Visit: Payer: Self-pay

## 2020-04-28 ENCOUNTER — Encounter (HOSPITAL_BASED_OUTPATIENT_CLINIC_OR_DEPARTMENT_OTHER): Payer: Self-pay

## 2020-04-28 ENCOUNTER — Emergency Department (HOSPITAL_BASED_OUTPATIENT_CLINIC_OR_DEPARTMENT_OTHER): Payer: Medicare HMO

## 2020-04-28 ENCOUNTER — Other Ambulatory Visit (HOSPITAL_BASED_OUTPATIENT_CLINIC_OR_DEPARTMENT_OTHER): Payer: Self-pay | Admitting: Emergency Medicine

## 2020-04-28 ENCOUNTER — Emergency Department (HOSPITAL_BASED_OUTPATIENT_CLINIC_OR_DEPARTMENT_OTHER)
Admission: EM | Admit: 2020-04-28 | Discharge: 2020-04-28 | Disposition: A | Discharge status: Home / Self Care | Payer: Medicare HMO | Attending: Emergency Medicine | Admitting: Emergency Medicine

## 2020-04-28 DIAGNOSIS — I82432 Acute embolism and thrombosis of left popliteal vein: Secondary | ICD-10-CM | POA: Diagnosis not present

## 2020-04-28 DIAGNOSIS — Z79899 Other long term (current) drug therapy: Secondary | ICD-10-CM | POA: Diagnosis not present

## 2020-04-28 DIAGNOSIS — M79662 Pain in left lower leg: Secondary | ICD-10-CM | POA: Diagnosis present

## 2020-04-28 DIAGNOSIS — I82442 Acute embolism and thrombosis of left tibial vein: Secondary | ICD-10-CM | POA: Diagnosis not present

## 2020-04-28 DIAGNOSIS — I82412 Acute embolism and thrombosis of left femoral vein: Secondary | ICD-10-CM | POA: Diagnosis not present

## 2020-04-28 DIAGNOSIS — I82890 Acute embolism and thrombosis of other specified veins: Secondary | ICD-10-CM | POA: Diagnosis not present

## 2020-04-28 DIAGNOSIS — I10 Essential (primary) hypertension: Secondary | ICD-10-CM | POA: Diagnosis not present

## 2020-04-28 DIAGNOSIS — Z7901 Long term (current) use of anticoagulants: Secondary | ICD-10-CM | POA: Insufficient documentation

## 2020-04-28 LAB — COMPREHENSIVE METABOLIC PANEL
ALT: 23 U/L (ref 0–44)
AST: 47 U/L — ABNORMAL HIGH (ref 15–41)
Albumin: 3.8 g/dL (ref 3.5–5.0)
Alkaline Phosphatase: 75 U/L (ref 38–126)
Anion gap: 11 (ref 5–15)
BUN: 16 mg/dL (ref 8–23)
CO2: 26 mmol/L (ref 22–32)
Calcium: 9.1 mg/dL (ref 8.9–10.3)
Chloride: 101 mmol/L (ref 98–111)
Creatinine, Ser: 1.06 mg/dL (ref 0.61–1.24)
GFR, Estimated: >60 mL/min (ref >60)
Glucose, Bld: 85 mg/dL (ref 70–99)
Potassium: 3.8 mmol/L (ref 3.5–5.1)
Sodium: 138 mmol/L (ref 135–145)
Total Bilirubin: 0.5 mg/dL (ref 0.3–1.2)
Total Protein: 7.4 g/dL (ref 6.5–8.1)

## 2020-04-28 LAB — CBC WITH DIFFERENTIAL/PLATELET
Abs Immature Granulocytes: 0.03 10*3/uL (ref 0.00–0.07)
Basophils Absolute: 0 10*3/uL (ref 0.0–0.1)
Basophils Relative: 0 %
Eosinophils Absolute: 0.2 10*3/uL (ref 0.0–0.5)
Eosinophils Relative: 3 %
HCT: 50.4 % (ref 39.0–52.0)
Hemoglobin: 16.4 g/dL (ref 13.0–17.0)
Immature Granulocytes: 0 %
Lymphocytes Relative: 23 %
Lymphs Abs: 1.8 10*3/uL (ref 0.7–4.0)
MCH: 28.7 pg (ref 26.0–34.0)
MCHC: 32.5 g/dL (ref 30.0–36.0)
MCV: 88.3 fL (ref 80.0–100.0)
Monocytes Absolute: 0.8 10*3/uL (ref 0.1–1.0)
Monocytes Relative: 10 %
Neutro Abs: 5.2 10*3/uL (ref 1.7–7.7)
Neutrophils Relative %: 64 %
Platelets: 302 10*3/uL (ref 150–400)
RBC: 5.71 MIL/uL (ref 4.22–5.81)
RDW: 14.6 % (ref 11.5–15.5)
WBC: 8 10*3/uL (ref 4.0–10.5)
nRBC: 0 % (ref 0.0–0.2)

## 2020-04-28 MED ORDER — APIXABAN (ELIQUIS) VTE STARTER PACK (10MG AND 5MG): ORAL_TABLET | ORAL | 0 refills | Status: DC

## 2020-04-28 MED ORDER — APIXABAN 2.5 MG PO TABS
10.0000 mg | ORAL_TABLET | Freq: Two times a day (BID) | ORAL | Status: DC
  Administered 2020-04-28: 10 mg via ORAL
  Filled 2020-04-28: qty 4

## 2020-04-28 MED ORDER — APIXABAN 2.5 MG PO TABS: 10.0000 mg | ORAL_TABLET | Freq: Two times a day (BID) | ORAL | Status: DC

## 2020-04-28 MED FILL — ELIQUIS STARTER PACK 5 MG T: 5 | 30 days supply | Qty: 74 | Fill #0

## 2020-04-28 NOTE — ED Notes (Signed)
ED Provider at bedside. 

## 2020-04-28 NOTE — ED Provider Notes (Signed)
[facility] EMERGENCY DEPARTMENT Provider Note   CSN: [id number] Arrival date & time: 04/28/20  1237     History No chief complaint on file.   Ryan Franklin is a 70 y.o. male.  HPI Patient is 70 year old male presenting today with left calf pain for approximately 5 days seems a migrated up into his thigh over the past 2 days.  He states his symptoms been constant achy worse with movement with no significant improving factors.  He states he has no significant past medical history apart from hypertension which for which he takes blood pressure medicine.  He does take aspirin daily.  This is for prevention he states he has no stents and no history of strokes.  No other significant symptoms.  Denies any chest pain or shortness of breath.  No recent long travel apart from the end of December he states that he had a 4-hour flight to [place].  He did have COVID at the beginning of this month.  No recent surgeries, hospitalization, hemoptysis, estrogen containing OCP, cancer history.  No history of PE or VTE.   Past Medical History:  Diagnosis Date  . Hypertension     There are no problems to display for this patient.   Past Surgical History:  Procedure Laterality Date  . HERNIA REPAIR         History reviewed. No pertinent family history.  Social History   Tobacco Use  . Smoking status: Never Smoker  . Smokeless tobacco: Never Used  Substance Use Topics  . Alcohol use: Never  . Drug use: Never    Home Medications Prior to Admission medications   Medication Sig Start Date End Date Taking? Authorizing Provider  amLODipine (NORVASC) 5 MG tablet Take 1 tablet (5 mg total) by mouth daily for 30 days. Patient taking differently: Take 2.5 mg by mouth daily.  08/15/18 11/08/19  Liberty Handy, PA-C  APIXABAN Everlene Balls) VTE STARTER PACK (10MG  AND 5MG ) Take as directed on package: start with two-5mg  tablets twice daily for 7 days. On day 8, switch to one-5mg  tablet  twice daily. 04/28/20   , PA  nortriptyline (PAMELOR) 10 MG capsule TAKE 2 CAPSULES BY MOUTH AT BEDTIME 02/06/20   Gailen Shelter, DO    Allergies    Patient has no allergy information on record.  Review of Systems   Review of Systems  Constitutional: Negative for chills and fever.  HENT: Negative for congestion.   Respiratory: Negative for shortness of breath.   Cardiovascular: Negative for chest pain.  Gastrointestinal: Negative for abdominal pain.  Musculoskeletal: Negative for neck pain.       Left leg swelling/pain    Physical Exam Updated Vital Signs BP (!) 129/34 (BP Location: Left Arm)   Pulse 91   Temp 97.7 F (36.5 C) (Oral)   Resp 19   Ht 5\' 10"  (1.778 m)   Wt 93 kg   SpO2 100%   BMI 29.41 kg/m   Physical Exam Vitals and nursing note reviewed.  Constitutional:      General: He is not in acute distress.    Appearance: Normal appearance. He is not ill-appearing.  HENT:     Head: Normocephalic and atraumatic.  Eyes:     General: No scleral icterus.       Right eye: No discharge.        Left eye: No discharge.     Conjunctiva/sclera: Conjunctivae normal.  Pulmonary:     Effort: Pulmonary  effort is normal.     Breath sounds: No stridor.  Musculoskeletal:     Comments: Left calf and left thigh very mildly swollen no significant tenderness to palpation.  Superficial varicosities present in the left anterior aspect of heel  Bilateral DP/PT pulses 3+ No lower extremity edema.  Lower extremity strength 5/5 bilateral knee flexion extension ankle flexion-extension ambulatory with no gait abnormality.  Skin:    General: Skin is warm and dry.  Neurological:     Mental Status: He is alert and oriented to person, place, and time. Mental status is at baseline.  Psychiatric:        Mood and Affect: Mood normal.        Behavior: Behavior normal.     ED Results / Procedures / Treatments   Labs (all labs ordered are listed, but only abnormal  results are displayed) Labs Reviewed  COMPREHENSIVE METABOLIC PANEL - Abnormal; Notable for the following components:      Result Value   AST 47 (*)    All other components within normal limits  CBC WITH DIFFERENTIAL/PLATELET    EKG None  Radiology US Venous Img Lower Unilateral Left  Result Date: 04/28/2020 CLINICAL DATA:  Recent COVID infection left pain for 2 days. EXAM: Left LOWER EXTREMITY VENOUS DOPPLER ULTRASOUND TECHNIQUE: Gray-scale sonography with graded compression, as well as color Doppler and duplex ultrasound were performed to evaluate the lower extremity deep venous systems from the level of the common femoral vein and including the common femoral, femoral, profunda femoral, popliteal and calf veins including the posterior tibial, peroneal and gastrocnemius veins when visible. The superficial great saphenous vein was also interrogated. Spectral Doppler was utilized to evaluate flow at rest and with distal augmentation maneuvers in the common femoral, femoral and popliteal veins. COMPARISON:  None. FINDINGS: Contralateral Common Femoral Vein: Respiratory phasicity is normal and symmetric with the symptomatic side. No evidence of thrombus. Normal compressibility. Common Femoral Vein: No evidence of thrombus. Normal compressibility, respiratory phasicity and response to augmentation. Saphenofemoral Junction: No evidence of thrombus. Normal compressibility and flow on color Doppler imaging. Profunda Femoral Vein: No evidence of thrombus. Normal compressibility and flow on color Doppler imaging. Femoral Vein: Occlusive thrombus. Popliteal Vein: Occlusive thrombus. Calf Veins: Occlusive thrombus noted in the posterior tibial vein, peroneal vein and gastrocnemius vein. Superficial Great Saphenous Vein: No evidence of thrombus. Normal compressibility. Venous Reflux:  None. Other Findings:  None. IMPRESSION: Occlusive deep venous thrombosis noted in the femoral vein, popliteal vein and calf  veins. Electronically Signed   By: Rudie Meyer M.D.   On: 04/28/2020 13:47    Procedures Procedures (including critical care time)  Medications Ordered in ED Medications  apixaban (ELIQUIS) tablet 10 mg (has no administration in time range)    ED Course  I have reviewed the triage vital signs and the nursing notes.  Pertinent labs & imaging results that were available during my care of the patient were reviewed by me and considered in my medical decision making (see chart for details).    MDM Rules/Calculators/A&P                           Patient is 70 year old male with left DVT. Vital signs are unremarkable. Physical exam noted below  I discussed this case with my attending physician who cosigned this note including patient's presenting symptoms, physical exam, and planned diagnostics and interventions. Attending physician stated agreement with plan or made changes to  plan which were implemented.   Clinical pharmacologist reviewed medication with patient prior to discharge.  He does have occlusive DVT of the left popliteal calf and femoral vein.  Physical exam he does have some mild swelling of the left calf and thigh with no edema good sensation good pulses good movement and strength.  His symptoms seem relatively minor he has no evidence of phlegmasia cerulea dolens.  He was given return precautions for this as well as chest pain shortness of breath precautions  Discharged with close follow-up with PCP and foot strict return precautions.  I also provided him with vascular specialist follow-up.  Final Clinical Impression(s) / ED Diagnoses Final diagnoses:  Acute deep vein thrombosis (DVT) of femoral vein of left lower extremity (HCC)  Acute deep vein thrombosis (DVT) of popliteal vein of left lower extremity (HCC)    Rx / DC Orders ED Discharge Orders         Ordered    APIXABAN (ELIQUIS) VTE STARTER PACK (10MG  AND 5MG )  Status:  Discontinued        04/28/20 1613     APIXABAN (ELIQUIS) VTE STARTER PACK (10MG  AND 5MG )        04/28/20 1613           04/30/20, PA 04/28/20 1623    , MD 04/30/20 1859

## 2020-04-28 NOTE — Progress Notes (Signed)
ANTICOAGULATION NOTE   Patient Measurements: Height: 5\' 10"  (177.8 cm) Weight: 93 kg (205 lb) IBW/kg (Calculated) : 73  Vital Signs: Temp: 97.7 F (36.5 C) (01/18 1254) Temp Source: Oral (01/18 1254) BP: 129/34 (01/18 1604) Pulse Rate: 91 (01/18 1604)  Labs: Recent Labs    04/28/20 1455  HGB 16.4  HCT 50.4  PLT 302  CREATININE 1.06    Estimated Creatinine Clearance: 77.5 mL/min (by C-G formula based on SCr of 1.06 mg/dL).  Medical History: Past Medical History:  Diagnosis Date  . Hypertension    Assessment: 20 yom with acute DVT started on apixaban. Pt has good renal function and CBC is WNL. He is not on anticoagulation PTA.    Plan:  Apixaban as prescribed by PA-C  Provided education and discount coupon to the patient All questions were answered and patient expressed understanding  Abie Cheek, 81 04/28/2020,4:28 PM

## 2020-04-28 NOTE — ED Triage Notes (Signed)
Pt reports having Left upper thigh pain started Sunday. Pt called pcp Monday and pcp wasn't in the office but other provider wanted him to come in and r/o blood clot. Pt wasn't able to get out of his driveway yesterday and came today. Pt reports having Left calf pain a week ago.

## 2020-04-28 NOTE — Discharge Instructions (Addendum)
You have a DVT of the left leg --please follow-up with your primary care doctor.  I have also given you the information for a vascular surgeon he may follow-up with.  Please take medication as prescribed.  One of our clinical pharmacist will discuss with you some details about this medication.  You may always return to the ER for any new or concerning symptoms such as those that we discussed.  Information on my medicine - ELIQUIS (apixaban)  This medication education was reviewed with me or my healthcare representative as part of my discharge preparation.    Why was Eliquis prescribed for you? Eliquis was prescribed to treat blood clots that may have been found in the veins of your legs (deep vein thrombosis) or in your lungs (pulmonary embolism) and to reduce the risk of them occurring again.  What do You need to know about Eliquis ? The starting dose is 10 mg (two 5 mg tablets) taken TWICE daily for the FIRST SEVEN (7) DAYS, then  the dose is reduced to ONE 5 mg tablet taken TWICE daily.  Eliquis may be taken with or without food.   Try to take the dose about the same time in the morning and in the evening. If you have difficulty swallowing the tablet whole please discuss with your pharmacist how to take the medication safely.  Take Eliquis exactly as prescribed and DO NOT stop taking Eliquis without talking to the doctor who prescribed the medication.  Stopping may increase your risk of developing a new blood clot.  Refill your prescription before you run out.  After discharge, you should have regular check-up appointments with your healthcare provider that is prescribing your Eliquis.    What do you do if you miss a dose? If a dose of ELIQUIS is not taken at the scheduled time, take it as soon as possible on the same day and twice-daily administration should be resumed. The dose should not be doubled to make up for a missed dose.  Important Safety Information A possible side  effect of Eliquis is bleeding. You should call your healthcare provider right away if you experience any of the following: ? Bleeding from an injury or your nose that does not stop. ? Unusual colored urine (red or dark brown) or unusual colored stools (red or black). ? Unusual bruising for unknown reasons. ? A serious fall or if you hit your head (even if there is no bleeding).  Some medicines may interact with Eliquis and might increase your risk of bleeding or clotting while on Eliquis. To help avoid this, consult your healthcare provider or pharmacist prior to using any new prescription or non-prescription medications, including herbals, vitamins, non-steroidal anti-inflammatory drugs (NSAIDs) and supplements.  This website has more information on Eliquis (apixaban): http://www.eliquis.com/eliquis/home

## 2020-05-08 ENCOUNTER — Ambulatory Visit
Admission: RE | Admit: 2020-05-08 | Discharge: 2020-05-08 | Disposition: A | Discharge status: Home / Self Care | Payer: Medicare HMO | Source: Ambulatory Visit | Attending: Neurology | Admitting: Neurology

## 2020-05-08 DIAGNOSIS — J320 Chronic maxillary sinusitis: Secondary | ICD-10-CM | POA: Diagnosis not present

## 2020-05-08 DIAGNOSIS — J0100 Acute maxillary sinusitis, unspecified: Secondary | ICD-10-CM | POA: Diagnosis not present

## 2020-05-08 DIAGNOSIS — I82432 Acute embolism and thrombosis of left popliteal vein: Secondary | ICD-10-CM | POA: Diagnosis not present

## 2020-05-08 DIAGNOSIS — G441 Vascular headache, not elsewhere classified: Secondary | ICD-10-CM | POA: Diagnosis not present

## 2020-05-08 DIAGNOSIS — I10 Essential (primary) hypertension: Secondary | ICD-10-CM | POA: Diagnosis not present

## 2020-05-08 DIAGNOSIS — I6381 Other cerebral infarction due to occlusion or stenosis of small artery: Secondary | ICD-10-CM | POA: Diagnosis not present

## 2020-05-08 DIAGNOSIS — G319 Degenerative disease of nervous system, unspecified: Secondary | ICD-10-CM | POA: Diagnosis not present

## 2020-05-08 DIAGNOSIS — R9089 Other abnormal findings on diagnostic imaging of central nervous system: Secondary | ICD-10-CM

## 2020-05-08 MED ORDER — GADOBENATE DIMEGLUMINE 529 MG/ML IV SOLN
20.0000 mL | Freq: Once | INTRAVENOUS | Status: AC | PRN
  Administered 2020-05-08: 20 mL via INTRAVENOUS

## 2020-06-08 NOTE — Progress Notes (Signed)
NEUROLOGY FOLLOW UP OFFICE NOTE  QUINTAVIUS NIEBUHR [id number]  Assessment/Plan:   1.  Headache, nonspecific, non-intractable 2.  Thiamine deficiency  1.  Continue nortriptyline 20mg  at bedtime.  We can increase to 50mg  at bedtime if needed 2.  Limit use of pain relievers to no more than 2 days out of week to prevent risk of rebound or medication-overuse headache. 3.  Check B1 level.  If normal, will have him discontinue supplement. 4.  Keep headache diary 5.  Follow up in 6 months.  Subjective:  Amir Fick. Kosmicki is a 70 year old right-handed male with HTN and PUD with history of GI bleed who follows up for headaches.  MRIs of brain and MRA of head personally reviewed.  UPDATE: MRI of brain with and without contrast on 12/10/2019 showed abnormal signal within the dorsal midbrain and pons and cerebellum, suspicious for toxic/metabolic disorders such as Wernicke encephalopathy.  MRA of head was normal.  Denied heavy alcohol use.  Vitamin B1 level was 7 and he was advised to start thiamine 100mg  daily.  Follow up MRI of brain with and without contrast on 05/08/2020 showed no evidence of signal abnormality suggesting that the previous findings were likely artifactual.   Started on nortriptyline for headache. Headaches improved. Mild headaches occur 3-4 times a week, lasting within an hour with treatment.  Severe headaches occurred 5 times since last visit.   Current NSAIDS:  none Current analgesics:  Tylenol (takes 3-4 times a week) Current triptans:  none Current ergotamine:  none Current anti-emetic:  none Current muscle relaxants:  none Current anti-anxiolytic:  none Current sleep aide:  none Current Antihypertensive medications:  amlodipine Current Antidepressant medications:  nortriptyline 20mg  Current Anticonvulsant medications:  none Current anti-CGRP:  Ubrelvy 100mg  Current Vitamins/Herbal/Supplements:  MVI, magesium 500mg , C, D Current Antihistamines/Decongestants:   none Other therapy:  none Hormone/birth control:  none  Currently on Eliquis for DVT.  HISTORY: He began having new daily near-persistent headaches in early 2021.  They are usually a severe right-sided non-throbbing pain.  They sometimes wake him up in the middle of the night.  Sometimes he is bed-bound.  On rare occasions, he has noted brief flashes in his left eye.  There is no associated nausea, vomiting, photophobia, phonophobia, osmophobia, speech disturbance, dizziness, numbness or weakness.  He has some neck tightness.  He was initially taking ibuprofen and ASA daily, but later was changed to tramadol, which he rarely takes (only if needed) but does seem to dull the pain.  No specific triggers.  He does have a history of different dull frontal headaches and ocular migraines (kaleidoscopic vision, no headache).  10/07/2019 LABS:  Sed rate 13 10/25/2019 CT HEAD WO:  Cortical atrophy but no acute intracranial abnormality.   Past NSAIDS/steroid:  ASA, ibuprofen, prednisone (can't take nsaids now due to Eliquis) Past analgesics:  tramadol Past abortive triptans:  none Past abortive ergotamine:  none Past muscle relaxants:  none Past anti-emetic:  none Past antihypertensive medications:  none Past antidepressant medications:  none Past anticonvulsant medications:  none Past anti-CGRP:  Ubrelvy (ineffective) Past vitamins/Herbal/Supplements:  none Past antihistamines/decongestants:  none Other past therapies:  none  PAST MEDICAL HISTORY: Past Medical History:  Diagnosis Date  . Hypertension     MEDICATIONS: Current Outpatient Medications on File Prior to Visit  Medication Sig Dispense Refill  . amLODipine (NORVASC) 5 MG tablet Take 1 tablet (5 mg total) by mouth daily for 30 days. (Patient taking differently: Take 2.5 mg  by mouth daily. ) 30 tablet 0  . APIXABAN (ELIQUIS) VTE STARTER PACK (10MG  AND 5MG ) Take as directed on package: start with two-5mg  tablets twice daily for  7 days. On day 8, switch to one-5mg  tablet twice daily. 1 each 0  . nortriptyline (PAMELOR) 10 MG capsule TAKE 2 CAPSULES BY MOUTH AT BEDTIME 60 capsule 5   No current facility-administered medications on file prior to visit.    ALLERGIES: Not on File  FAMILY HISTORY: No family history on file.   Objective:  Blood pressure (!) 162/93, pulse 75, resp. rate 18, height 5\' 10"  (1.778 m), weight 215 lb (97.5 kg), SpO2 98 %. General: No acute distress.  Patient appears well-groomed.       , DO  CC: , MD

## 2020-06-09 ENCOUNTER — Ambulatory Visit: Payer: Medicare HMO | Admitting: Neurology

## 2020-06-09 ENCOUNTER — Other Ambulatory Visit (INDEPENDENT_AMBULATORY_CARE_PROVIDER_SITE_OTHER): Payer: Medicare HMO

## 2020-06-09 ENCOUNTER — Other Ambulatory Visit: Payer: Self-pay

## 2020-06-09 ENCOUNTER — Encounter: Payer: Self-pay | Admitting: Neurology

## 2020-06-09 VITALS — BP 162/93 | HR 75 | Resp 18 | Ht 70.0 in | Wt 215.0 lb

## 2020-06-09 DIAGNOSIS — R519 Headache, unspecified: Secondary | ICD-10-CM

## 2020-06-09 DIAGNOSIS — E519 Thiamine deficiency, unspecified: Secondary | ICD-10-CM | POA: Diagnosis not present

## 2020-06-09 NOTE — Patient Instructions (Signed)
1.  Continue nortriptyline 20mg  at bedtime 2.  Limit use of pain relievers to no more than 2 days out of week to prevent risk of rebound or medication-overuse headache. 3.  Will check a repeat thiamine (B1) level 4.  Follow up 6 months.

## 2020-06-11 DIAGNOSIS — H52222 Regular astigmatism, left eye: Secondary | ICD-10-CM | POA: Diagnosis not present

## 2020-06-13 LAB — VITAMIN B1: Vitamin B1 (Thiamine): 21 nmol/L (ref 8–30)

## 2020-07-16 ENCOUNTER — Other Ambulatory Visit: Payer: Self-pay | Admitting: Family Medicine

## 2020-07-16 ENCOUNTER — Other Ambulatory Visit: Payer: Self-pay

## 2020-07-16 ENCOUNTER — Ambulatory Visit
Admission: RE | Admit: 2020-07-16 | Discharge: 2020-07-16 | Disposition: A | Discharge status: Home / Self Care | Payer: Medicare HMO | Source: Ambulatory Visit | Attending: Family Medicine | Admitting: Family Medicine

## 2020-07-16 DIAGNOSIS — I82412 Acute embolism and thrombosis of left femoral vein: Secondary | ICD-10-CM | POA: Diagnosis not present

## 2020-07-16 DIAGNOSIS — Z125 Encounter for screening for malignant neoplasm of prostate: Secondary | ICD-10-CM | POA: Diagnosis not present

## 2020-07-16 DIAGNOSIS — G4489 Other headache syndrome: Secondary | ICD-10-CM | POA: Diagnosis not present

## 2020-07-16 DIAGNOSIS — I82402 Acute embolism and thrombosis of unspecified deep veins of left lower extremity: Secondary | ICD-10-CM | POA: Diagnosis not present

## 2020-07-16 DIAGNOSIS — I82890 Acute embolism and thrombosis of other specified veins: Secondary | ICD-10-CM | POA: Diagnosis not present

## 2020-07-16 DIAGNOSIS — I82432 Acute embolism and thrombosis of left popliteal vein: Secondary | ICD-10-CM | POA: Diagnosis not present

## 2020-07-16 DIAGNOSIS — I10 Essential (primary) hypertension: Secondary | ICD-10-CM | POA: Diagnosis not present

## 2020-07-16 DIAGNOSIS — E7800 Pure hypercholesterolemia, unspecified: Secondary | ICD-10-CM | POA: Diagnosis not present

## 2020-07-16 DIAGNOSIS — I824Y9 Acute embolism and thrombosis of unspecified deep veins of unspecified proximal lower extremity: Secondary | ICD-10-CM

## 2020-08-07 ENCOUNTER — Other Ambulatory Visit: Payer: Self-pay | Admitting: Neurology

## 2020-08-10 ENCOUNTER — Other Ambulatory Visit: Payer: Self-pay

## 2020-08-10 MED ORDER — NORTRIPTYLINE HCL 10 MG PO CAPS: 20.0000 mg | ORAL_CAPSULE | Freq: Every day | ORAL | 5 refills | Status: DC

## 2020-09-29 DIAGNOSIS — Z0100 Encounter for examination of eyes and vision without abnormal findings: Secondary | ICD-10-CM | POA: Diagnosis not present

## 2020-09-29 DIAGNOSIS — H34211 Partial retinal artery occlusion, right eye: Secondary | ICD-10-CM | POA: Diagnosis not present

## 2020-10-01 ENCOUNTER — Other Ambulatory Visit: Payer: Self-pay | Admitting: Family Medicine

## 2020-10-01 DIAGNOSIS — H349 Unspecified retinal vascular occlusion: Secondary | ICD-10-CM

## 2020-10-07 DIAGNOSIS — R002 Palpitations: Secondary | ICD-10-CM | POA: Diagnosis not present

## 2020-10-07 DIAGNOSIS — H34211 Partial retinal artery occlusion, right eye: Secondary | ICD-10-CM | POA: Diagnosis not present

## 2020-10-09 ENCOUNTER — Ambulatory Visit
Admission: RE | Admit: 2020-10-09 | Discharge: 2020-10-09 | Disposition: A | Discharge status: Home / Self Care | Payer: Medicare HMO | Source: Ambulatory Visit | Attending: Family Medicine | Admitting: Family Medicine

## 2020-10-09 ENCOUNTER — Other Ambulatory Visit: Payer: Self-pay

## 2020-10-09 DIAGNOSIS — H349 Unspecified retinal vascular occlusion: Secondary | ICD-10-CM

## 2020-10-09 DIAGNOSIS — I6523 Occlusion and stenosis of bilateral carotid arteries: Secondary | ICD-10-CM | POA: Diagnosis not present

## 2020-10-30 ENCOUNTER — Other Ambulatory Visit: Payer: Self-pay

## 2020-10-30 DIAGNOSIS — I6529 Occlusion and stenosis of unspecified carotid artery: Secondary | ICD-10-CM

## 2020-11-05 ENCOUNTER — Encounter: Payer: Self-pay | Admitting: Vascular Surgery

## 2020-11-05 ENCOUNTER — Other Ambulatory Visit: Payer: Self-pay

## 2020-11-05 ENCOUNTER — Ambulatory Visit (HOSPITAL_COMMUNITY): Admission: RE | Admit: 2020-11-05 | Payer: Medicare HMO | Source: Ambulatory Visit

## 2020-11-05 ENCOUNTER — Ambulatory Visit: Payer: Medicare HMO | Admitting: Vascular Surgery

## 2020-11-05 VITALS — BP 116/82 | HR 65 | Temp 98.2°F | Resp 20 | Ht 70.0 in | Wt 201.0 lb

## 2020-11-05 DIAGNOSIS — I6521 Occlusion and stenosis of right carotid artery: Secondary | ICD-10-CM | POA: Diagnosis not present

## 2020-11-05 NOTE — Progress Notes (Signed)
Referring Physician: Dr. [name]  Patient name: Ryan Franklin MRN: [id number] DOB: 1950-12-09 Sex: male  REASON FOR CONSULT: Right retinal embolus  HPI: Ryan Franklin is a 70 y.o. male, who on routine screening eye exam was noted to have embolic particle in the right retinal artery.  Carotid duplex subsequently showed 50 to 70% right internal carotid artery stenosis less than 50% left internal carotid artery stenosis.  Patient is a former smoker and quit many years ago.  He does have a history of hypertension.  He does not have a history of elevated cholesterol.  He has no family history of early strokes.  He has no symptoms of TIA amaurosis or stroke and this was purely an incidental finding.  He is on Eliquis for a recent DVT after having COVID in January 2022.  States that the current plan by his primary care physician is to continue this for a total of 6 months.  He still has trace swelling in the left leg.  He has no symptoms of shortness of breath.  Additionally complains of a reddish type rash that appeared on both lower extremities recently and resolved over a few days.  He had been standing on his feet all day when the rash appeared.  It currently has resolved.  There was no itching associated with it.  He showed me images of this on his phone.  Past Medical History:  Diagnosis Date   Hypertension    Past Surgical History:  Procedure Laterality Date   HERNIA REPAIR      History reviewed. No pertinent family history.  SOCIAL HISTORY: Social History   Socioeconomic History   Marital status: Single    Spouse name: Not on file   Number of children: Not on file   Years of education: Not on file   Highest education level: Not on file  Occupational History   Occupation: works for himself  Tobacco Use   Smoking status: Never   Smokeless tobacco: Never  Vaping Use   Vaping Use: Never used  Substance and Sexual Activity   Alcohol use: Yes   Drug use: Never    Sexual activity: Not on file  Other Topics Concern   Not on file  Social History Narrative   Right handed   Lives alone two story home   Drinks caffeine   Social Determinants of Health   Financial Resource Strain: Not on file  Food Insecurity: Not on file  Transportation Needs: Not on file  Physical Activity: Not on file  Stress: Not on file  Social Connections: Not on file  Intimate Partner Violence: Not on file    No Known Allergies  Current Outpatient Medications  Medication Sig Dispense Refill   APIXABAN (ELIQUIS) VTE STARTER PACK (10MG  AND 5MG ) TAKE TWO 5MG  TABLETS BY MOUTH 2 TIMES DAILY FOR 7 DAYS. ON DAY 8 SWITCH TO ONE 5MG  TABLET 2 TIMES DAILY 74 tablet 0   cholecalciferol (VITAMIN D3) 25 MCG (1000 UNIT) tablet 1 tablet     famotidine-calcium carbonate-magnesium hydroxide (PEPCID COMPLETE) 10-800-165 MG chewable tablet 1 tablet as needed     Magnesium 500 MG TABS 1 tablet with a meal     Multiple Vitamin (MULTI VITAMIN) TABS 1 tablet     nortriptyline (PAMELOR) 10 MG capsule Take 2 capsules (20 mg total) by mouth at bedtime. 60 capsule 5   omeprazole (PRILOSEC) 20 MG capsule Take by mouth.     thiamine (VITAMIN B-1) 100 MG  tablet 1 tablet     traMADol (ULTRAM) 50 MG tablet      amLODipine (NORVASC) 5 MG tablet Take 1 tablet (5 mg total) by mouth daily for 30 days. (Patient taking differently: Take 2.5 mg by mouth daily.) 30 tablet 0   No current facility-administered medications for this visit.    ROS:   General:  No weight loss, Fever, chills  HEENT: No recent headaches, no nasal bleeding, no visual changes, no sore throat  Neurologic: No dizziness, blackouts, seizures. No recent symptoms of stroke or mini- stroke. No recent episodes of slurred speech, or temporary blindness.  Cardiac: No recent episodes of chest pain/pressure, no shortness of breath at rest.  No shortness of breath with exertion.  Denies history of atrial fibrillation or irregular  heartbeat  Vascular: No history of rest pain in feet.  No history of claudication.  No history of non-healing ulcer, No history of DVT   Pulmonary: No home oxygen, no productive cough, no hemoptysis,  No asthma or wheezing  Musculoskeletal:  [ ]  Arthritis, [ ]  Low back pain,  [ ]  Joint pain  Hematologic:No history of hypercoagulable state.  No history of easy bleeding.  No history of anemia  Gastrointestinal: No hematochezia or melena,  No gastroesophageal reflux, no trouble swallowing  Urinary: [ ]  chronic Kidney disease, [ ]  on HD - [ ]  MWF or [ ]  TTHS, [ ]  Burning with urination, [ ]  Frequent urination, [ ]  Difficulty urinating;   Skin: No rashes  Psychological: No history of anxiety,  No history of depression   Physical Examination  Vitals:   11/05/20 1005 11/05/20 1007  BP: 134/86 116/82  Pulse: 65   Resp: 20   Temp: 98.2 F (36.8 C)   SpO2: 97%   Weight: 201 lb (91.2 kg)   Height: 5\' 10"  (1.778 m)     Body mass index is 28.84 kg/m.  General:  Alert and oriented, no acute distress HEENT: Normal, no visual field cut Neck: No JVD Cardiac: Regular Rate and Rhythm  Skin: No rash Extremity Pulses:  2+ radial, brachial, femoral, absent dorsalis pedis, plus posterior tibial pulses bilaterally Musculoskeletal: No deformity trace left leg edema  Neurologic: Upper and lower extremity motor 5/5 and symmetric  DATA:  I reviewed the patient's recent carotid duplex exam from [facility] imaging dated October 09, 2020.  This shows 50 to 70% right internal carotid artery stenosis less than 50% left internal carotid artery stenosis.  ASSESSMENT: 1.  Right carotid stenosis possibly atheroembolic event currently asymptomatic incidental finding  2.  Most likely hemosiderin staining rash both legs associated with an element of venous insufficiency   PLAN: 1.  We will obtain CT angio of the head and neck to further define the stenosis on the right side.  If this is definitely more than  50% and has a jagged appearance he may benefit from carotid endarterectomy or carotid stenting for stroke prophylaxis.  I will see him back in follow-up after his CT scan.  I added aspirin 81 mg once daily to his medication regimen today.  We did discuss slightly increased risk of bleeding combined with Eliquis.  2.  Patient was given a prescription today for lower extremity compression stockings 20 to 30 mmHg to decrease leg symptoms and told that if his symptoms continue to persist or worsen over time we could consider a venous reflux exam at some point in the future after his DVT has resolved.   , MD  [facility] [place] Office: [phone]

## 2020-11-06 ENCOUNTER — Other Ambulatory Visit: Payer: Self-pay

## 2020-11-06 DIAGNOSIS — I6521 Occlusion and stenosis of right carotid artery: Secondary | ICD-10-CM

## 2020-11-13 ENCOUNTER — Ambulatory Visit (HOSPITAL_COMMUNITY)
Admission: RE | Admit: 2020-11-13 | Discharge: 2020-11-13 | Disposition: A | Discharge status: Home / Self Care | Payer: Medicare HMO | Source: Ambulatory Visit | Attending: Vascular Surgery | Admitting: Vascular Surgery

## 2020-11-13 ENCOUNTER — Other Ambulatory Visit: Payer: Self-pay

## 2020-11-13 DIAGNOSIS — G319 Degenerative disease of nervous system, unspecified: Secondary | ICD-10-CM | POA: Diagnosis not present

## 2020-11-13 DIAGNOSIS — I6381 Other cerebral infarction due to occlusion or stenosis of small artery: Secondary | ICD-10-CM | POA: Diagnosis not present

## 2020-11-13 DIAGNOSIS — I6521 Occlusion and stenosis of right carotid artery: Secondary | ICD-10-CM | POA: Diagnosis not present

## 2020-11-13 DIAGNOSIS — I6523 Occlusion and stenosis of bilateral carotid arteries: Secondary | ICD-10-CM | POA: Diagnosis not present

## 2020-11-13 DIAGNOSIS — M5030 Other cervical disc degeneration, unspecified cervical region: Secondary | ICD-10-CM | POA: Diagnosis not present

## 2020-11-13 LAB — POCT I-STAT CREATININE: Creatinine, Ser: 1 mg/dL (ref 0.61–1.24)

## 2020-11-13 MED ORDER — IOHEXOL 350 MG/ML SOLN
75.0000 mL | Freq: Once | INTRAVENOUS | Status: AC | PRN
  Administered 2020-11-13: 75 mL via INTRAVENOUS

## 2020-11-19 ENCOUNTER — Ambulatory Visit (INDEPENDENT_AMBULATORY_CARE_PROVIDER_SITE_OTHER): Payer: Medicare HMO | Admitting: Vascular Surgery

## 2020-11-19 ENCOUNTER — Other Ambulatory Visit: Payer: Self-pay

## 2020-11-19 ENCOUNTER — Encounter: Payer: Self-pay | Admitting: Vascular Surgery

## 2020-11-19 VITALS — BP 120/81 | HR 67 | Temp 98.4°F | Resp 20 | Ht 70.0 in | Wt 195.0 lb

## 2020-11-19 DIAGNOSIS — I6521 Occlusion and stenosis of right carotid artery: Secondary | ICD-10-CM | POA: Diagnosis not present

## 2020-11-19 MED ORDER — ROSUVASTATIN CALCIUM 10 MG PO TABS: 10.0000 mg | ORAL_TABLET | Freq: Every day | ORAL | 12 refills | Status: DC

## 2020-11-19 NOTE — Progress Notes (Signed)
Patient is a 70 year old male who returns for follow-up today.  He was recently noted to have an area of embolic material on retina exam.  He did not have any symptoms from this.  He continues to have no symptoms of TIA amaurosis or stroke.  He currently is on Eliquis for a left leg DVT.  He is also on aspirin.  He has been debating whether or not to start a statin.  Past Medical History:  Diagnosis Date   Hypertension     Past Surgical History:  Procedure Laterality Date   HERNIA REPAIR       Current Outpatient Medications on File Prior to Visit  Medication Sig Dispense Refill   APIXABAN (ELIQUIS) VTE STARTER PACK (10MG  AND 5MG ) TAKE TWO 5MG  TABLETS BY MOUTH 2 TIMES DAILY FOR 7 DAYS. ON DAY 8 SWITCH TO ONE 5MG  TABLET 2 TIMES DAILY 74 tablet 0   cholecalciferol (VITAMIN D3) 25 MCG (1000 UNIT) tablet 1 tablet     famotidine-calcium carbonate-magnesium hydroxide (PEPCID COMPLETE) 10-800-165 MG chewable tablet 1 tablet as needed     Magnesium 500 MG TABS 1 tablet with a meal     Multiple Vitamin (MULTI VITAMIN) TABS 1 tablet     nortriptyline (PAMELOR) 10 MG capsule Take 2 capsules (20 mg total) by mouth at bedtime. 60 capsule 5   thiamine (VITAMIN B-1) 100 MG tablet 1 tablet     traMADol (ULTRAM) 50 MG tablet      amLODipine (NORVASC) 5 MG tablet Take 1 tablet (5 mg total) by mouth daily for 30 days. (Patient taking differently: Take 2.5 mg by mouth daily.) 30 tablet 0   No current facility-administered medications on file prior to visit.   Physical exam:  Vitals:   11/19/20 1116 11/19/20 1118  BP: 131/83 120/81  Pulse: 67   Resp: 20   Temp: 98.4 F (36.9 C)   SpO2: 97%   Weight: 195 lb (88.5 kg)   Height: 5\' 10"  (1.778 m)     Neuro: No facial asymmetry, upper extremity lower extremity 5/5 motor no gait abnormality  Data: I reviewed the patient's CT angiogram of the head and neck.  This shows an old lacunar infarct.  There is about a 50% stenosis of the proximal right  internal carotid artery with calcific plaque.  No significant vertebral or carotid stenosis otherwise.  I reviewed and interpreted these images.  I also discussed all these images and reviewed these with the patient.  Assessment: 50% right internal carotid artery stenosis.  Clinically silent embolic event to retina.  Old basal ganglia stroke most likely secondary to hypertension.  Plan: Would not consider carotid intervention currently as level of stenosis is right at 50% near the carotid bulb.  Would consider intervention if he develops recurrent symptoms or progression of disease over time.  Patient is amenable to starting a statin at this point and he was started on Crestor 10 mg once a day today.  He was given 30 tablets and 12 refills.  He will continue his aspirin.  He will continue full treatment for his DVT and Eliquis.  He will follow-up in our APP clinic in 3 months time for repeat carotid duplex scan.  If he is having continued progression of stenosis would consider carotid intervention or if he develops any new symptoms.  , MD [facility] [place] Office: [phone]

## 2020-11-20 ENCOUNTER — Other Ambulatory Visit: Payer: Self-pay

## 2020-11-20 DIAGNOSIS — I6521 Occlusion and stenosis of right carotid artery: Secondary | ICD-10-CM

## 2021-01-10 DIAGNOSIS — J029 Acute pharyngitis, unspecified: Secondary | ICD-10-CM | POA: Diagnosis not present

## 2021-01-10 DIAGNOSIS — Z20828 Contact with and (suspected) exposure to other viral communicable diseases: Secondary | ICD-10-CM | POA: Diagnosis not present

## 2021-01-28 DIAGNOSIS — G4489 Other headache syndrome: Secondary | ICD-10-CM | POA: Diagnosis not present

## 2021-01-28 DIAGNOSIS — H349 Unspecified retinal vascular occlusion: Secondary | ICD-10-CM | POA: Diagnosis not present

## 2021-01-28 DIAGNOSIS — Z23 Encounter for immunization: Secondary | ICD-10-CM | POA: Diagnosis not present

## 2021-01-28 DIAGNOSIS — I10 Essential (primary) hypertension: Secondary | ICD-10-CM | POA: Diagnosis not present

## 2021-01-28 DIAGNOSIS — Z86718 Personal history of other venous thrombosis and embolism: Secondary | ICD-10-CM | POA: Diagnosis not present

## 2021-01-28 DIAGNOSIS — K219 Gastro-esophageal reflux disease without esophagitis: Secondary | ICD-10-CM | POA: Diagnosis not present

## 2021-01-28 DIAGNOSIS — E7800 Pure hypercholesterolemia, unspecified: Secondary | ICD-10-CM | POA: Diagnosis not present

## 2021-01-28 DIAGNOSIS — I6521 Occlusion and stenosis of right carotid artery: Secondary | ICD-10-CM | POA: Diagnosis not present

## 2021-01-28 DIAGNOSIS — Z0000 Encounter for general adult medical examination without abnormal findings: Secondary | ICD-10-CM | POA: Diagnosis not present

## 2021-02-03 ENCOUNTER — Other Ambulatory Visit: Payer: Self-pay | Admitting: Neurology

## 2021-02-05 ENCOUNTER — Telehealth: Payer: Self-pay | Admitting: Neurology

## 2021-02-05 MED ORDER — NORTRIPTYLINE HCL 10 MG PO CAPS: 20.0000 mg | ORAL_CAPSULE | Freq: Every day | ORAL | 5 refills | Status: DC

## 2021-02-05 NOTE — Telephone Encounter (Signed)
Pt scheduled follow up. Needs refill of Nortriptyline to [facility].  Only has 2 left.

## 2021-02-05 NOTE — Telephone Encounter (Signed)
Refill sent in for pt. 

## 2021-02-08 ENCOUNTER — Ambulatory Visit: Payer: Medicare HMO

## 2021-02-08 DIAGNOSIS — H3581 Retinal edema: Secondary | ICD-10-CM | POA: Diagnosis not present

## 2021-02-15 ENCOUNTER — Ambulatory Visit (HOSPITAL_COMMUNITY)
Admission: RE | Admit: 2021-02-15 | Discharge: 2021-02-15 | Disposition: A | Discharge status: Home / Self Care | Payer: Medicare HMO | Source: Ambulatory Visit | Attending: Surgery | Admitting: Surgery

## 2021-02-15 ENCOUNTER — Ambulatory Visit: Payer: Medicare HMO | Admitting: Physician Assistant

## 2021-02-15 ENCOUNTER — Encounter: Payer: Self-pay | Admitting: Physician Assistant

## 2021-02-15 ENCOUNTER — Other Ambulatory Visit: Payer: Self-pay

## 2021-02-15 VITALS — BP 120/80 | HR 73 | Temp 98.5°F | Resp 20 | Ht 70.0 in | Wt 201.9 lb

## 2021-02-15 DIAGNOSIS — Z8249 Family history of ischemic heart disease and other diseases of the circulatory system: Secondary | ICD-10-CM | POA: Diagnosis not present

## 2021-02-15 DIAGNOSIS — I6521 Occlusion and stenosis of right carotid artery: Secondary | ICD-10-CM

## 2021-02-15 NOTE — Progress Notes (Signed)
HISTORY AND PHYSICAL     CC:  follow up. Requesting Provider:  [name], MD  HPI: This is a 70 y.o. male here for follow up for carotid artery stenosis.   Pt was last seen 11/19/2020 by Dr. Darrick Penna. He had been noted to have an area of embolic material on his retina exam and was asymptomatic from this.  He did not have any TIA or stroke sx.  He had been placed on Eliquis for DVT after having covid in January 2022.  He was started on a statin at that visit.   Pt returns today for follow up.    Pt denies any amaurosis fugax, speech difficulties, weakness, numbness, paralysis or clumsiness or facial droop.  He states that his recent eye exam revealed that the embolic material around the retina is now gone.  He states that his PCP had asked him to ask Dr. Darrick Penna about having to remain on Eliquis for the DVT.   The pt is on a statin for cholesterol management.  The pt is on a daily aspirin.   Other AC:  none The pt is on CCB for hypertension.   The pt is not diabetic.   Tobacco hx:  many years ago  Pt does  have family hx of AAA with a first cousin (his father's brother's son) died from AAA.  Past Medical History:  Diagnosis Date   Hypertension     Past Surgical History:  Procedure Laterality Date   HERNIA REPAIR      No Known Allergies  Current Outpatient Medications  Medication Sig Dispense Refill   APIXABAN (ELIQUIS) VTE STARTER PACK (10MG  AND 5MG ) TAKE TWO 5MG  TABLETS BY MOUTH 2 TIMES DAILY FOR 7 DAYS. ON DAY 8 SWITCH TO ONE 5MG  TABLET 2 TIMES DAILY 74 tablet 0   aspirin EC 81 MG tablet Take 81 mg by mouth daily. Swallow whole.     cholecalciferol (VITAMIN D3) 25 MCG (1000 UNIT) tablet 1 tablet     famotidine-calcium carbonate-magnesium hydroxide (PEPCID COMPLETE) 10-800-165 MG chewable tablet 1 tablet as needed     Magnesium 500 MG TABS 1 tablet with a meal     Multiple Vitamin (MULTI VITAMIN) TABS 1 tablet     nortriptyline (PAMELOR) 10 MG capsule Take 2 capsules (20  mg total) by mouth at bedtime. 60 capsule 5   rosuvastatin (CRESTOR) 10 MG tablet Take 1 tablet (10 mg total) by mouth daily. 30 tablet 12   thiamine (VITAMIN B-1) 100 MG tablet 1 tablet     traMADol (ULTRAM) 50 MG tablet      amLODipine (NORVASC) 5 MG tablet Take 1 tablet (5 mg total) by mouth daily for 30 days. (Patient taking differently: Take 2.5 mg by mouth daily.) 30 tablet 0   No current facility-administered medications for this visit.    No family history on file.  Social History   Socioeconomic History   Marital status: Single    Spouse name: Not on file   Number of children: Not on file   Years of education: Not on file   Highest education level: Not on file  Occupational History   Occupation: works for himself  Tobacco Use   Smoking status: Never   Smokeless tobacco: Never  Vaping Use   Vaping Use: Never used  Substance and Sexual Activity   Alcohol use: Yes   Drug use: Never   Sexual activity: Not on file  Other Topics Concern   Not on file  Social  History Narrative   Right handed   Lives alone two story home   Drinks caffeine   Social Determinants of Health   Financial Resource Strain: Not on file  Food Insecurity: Not on file  Transportation Needs: Not on file  Physical Activity: Not on file  Stress: Not on file  Social Connections: Not on file  Intimate Partner Violence: Not on file     REVIEW OF SYSTEMS:   [X]  denotes positive finding, [ ]  denotes negative finding Cardiac  Comments:  Chest pain or chest pressure:    Shortness of breath upon exertion:    Short of breath when lying flat:    Irregular heart rhythm:        Vascular    Pain in calf, thigh, or hip brought on by ambulation:    Pain in feet at night that wakes you up from your sleep:     Blood clot in your veins:    Leg swelling:         Pulmonary    Oxygen at home:    Productive cough:     Wheezing:         Neurologic    Sudden weakness in arms or legs:     Sudden  numbness in arms or legs:     Sudden onset of difficulty speaking or slurred speech:    Temporary loss of vision in one eye:     Problems with dizziness:         Gastrointestinal    Blood in stool:     Vomited blood:         Genitourinary    Burning when urinating:     Blood in urine:        Psychiatric    Major depression:         Hematologic    Bleeding problems:    Problems with blood clotting too easily:        Skin    Rashes or ulcers:        Constitutional    Fever or chills:      PHYSICAL EXAMINATION:  Today's Vitals   02/15/21 1027 02/15/21 1029  BP: 123/84 120/80  Pulse: 73   Resp: 20   Temp: 98.5 F (36.9 C)   TempSrc: Temporal   SpO2: 98%   Weight: 201 lb 14.4 oz (91.6 kg)   Height: 5\' 10"  (1.778 m)    Body mass index is 28.97 kg/m.   General:  WDWN in NAD; vital signs documented above Gait: Not observed HENT: WNL, normocephalic Pulmonary: normal non-labored breathing Cardiac: regular HR, without carotid bruits Abdomen: soft, NT; aortic pulse is  palpable Skin: without rashes Vascular Exam/Pulses:  Right Left  Radial 2+ (normal) 2+ (normal)  Popliteal 2+ (normal) Unable to palpate  PT 2+ (normal) 2+ (normal)   Extremities: without ischemic changes, without Gangrene , without cellulitis; without open wounds Musculoskeletal: no muscle wasting or atrophy  Neurologic: A&O X 3; moving all extremities equally; speech is fluent/normal Psychiatric:  The pt has Normal affect.   Non-Invasive Vascular Imaging:   Carotid Duplex on 02/15/2021: Right:  40-59% ICA stenosis Left:  1-39% ICA stenosis Vertebrals:  Bilateral vertebral arteries demonstrate antegrade flow.  Subclavians: Normal flow hemodynamics were seen in bilateral subclavian arteries.    ASSESSMENT/PLAN:: 70 y.o. male here for follow up carotid artery stenosis    Carotid stenosis -duplex today reveals his duplex is essentially unchanged and remains in the 40-59% category for right  ICA stenosis.  He will f/u for this in one year.  Discussed with him that if this progresses to greater than 80%, then we would discuss intervention.  -discussed s/s of stroke with pt and he understands should he develop any of these sx, he will go to the nearest ER or call 911.  Family history of AAA -pt does have family hx of AAA.  His aortic pulse is palpable as well as his right popliteal pulse.  He will return in the next 2-4 weeks with AAA medicare screen.  Discussed that if he develops sudden onset of sharp severe abdominal or back pain, to call 911. -if his AAA duplex comes back with his aorta aneurysmal, he will need a popliteal duplex to check for popliteal aneurysms.   DVT -discussed with Dr. Karin Lieu and given the pt had a provoked DVT from Covid in January 2022, he would only need [facility] for 3- 6 months.  Discussed with pt that if he developed new DVT, he would need AC for life time.    -pt will call sooner should they have any issues. -continue statin/asa   Doreatha Massed, [facility] [phone]  Clinic MD:  Karin Lieu

## 2021-02-16 ENCOUNTER — Other Ambulatory Visit: Payer: Self-pay

## 2021-02-16 DIAGNOSIS — Z8249 Family history of ischemic heart disease and other diseases of the circulatory system: Secondary | ICD-10-CM

## 2021-03-01 ENCOUNTER — Encounter: Payer: Self-pay | Admitting: Physician Assistant

## 2021-03-01 ENCOUNTER — Ambulatory Visit: Payer: Medicare HMO | Admitting: Physician Assistant

## 2021-03-01 ENCOUNTER — Other Ambulatory Visit: Payer: Self-pay

## 2021-03-01 ENCOUNTER — Ambulatory Visit (HOSPITAL_COMMUNITY)
Admission: RE | Admit: 2021-03-01 | Discharge: 2021-03-01 | Disposition: A | Discharge status: Home / Self Care | Payer: Medicare HMO | Source: Ambulatory Visit | Attending: Vascular Surgery | Admitting: Vascular Surgery

## 2021-03-01 VITALS — BP 132/88 | HR 71 | Temp 98.4°F | Resp 20 | Ht 70.0 in | Wt 201.2 lb

## 2021-03-01 DIAGNOSIS — R519 Headache, unspecified: Secondary | ICD-10-CM | POA: Insufficient documentation

## 2021-03-01 DIAGNOSIS — H34211 Partial retinal artery occlusion, right eye: Secondary | ICD-10-CM | POA: Insufficient documentation

## 2021-03-01 DIAGNOSIS — K219 Gastro-esophageal reflux disease without esophagitis: Secondary | ICD-10-CM | POA: Insufficient documentation

## 2021-03-01 DIAGNOSIS — E7800 Pure hypercholesterolemia, unspecified: Secondary | ICD-10-CM | POA: Insufficient documentation

## 2021-03-01 DIAGNOSIS — I6529 Occlusion and stenosis of unspecified carotid artery: Secondary | ICD-10-CM | POA: Insufficient documentation

## 2021-03-01 DIAGNOSIS — I10 Essential (primary) hypertension: Secondary | ICD-10-CM | POA: Insufficient documentation

## 2021-03-01 DIAGNOSIS — Z8249 Family history of ischemic heart disease and other diseases of the circulatory system: Secondary | ICD-10-CM

## 2021-03-01 DIAGNOSIS — Z8489 Family history of other specified conditions: Secondary | ICD-10-CM | POA: Diagnosis not present

## 2021-03-01 DIAGNOSIS — H349 Unspecified retinal vascular occlusion: Secondary | ICD-10-CM | POA: Insufficient documentation

## 2021-03-01 DIAGNOSIS — M545 Low back pain, unspecified: Secondary | ICD-10-CM | POA: Insufficient documentation

## 2021-03-01 DIAGNOSIS — Z136 Encounter for screening for cardiovascular disorders: Secondary | ICD-10-CM | POA: Insufficient documentation

## 2021-03-01 DIAGNOSIS — I6521 Occlusion and stenosis of right carotid artery: Secondary | ICD-10-CM | POA: Insufficient documentation

## 2021-03-01 NOTE — Progress Notes (Signed)
Office Note     CC:  follow up Requesting Provider:  [name], MD  HPI: Ryan Franklin is a 70 y.o. (04/16/1950) male who presents for screening for abdominal aortic aneurysm with positive family history.  He is followed for carotid artery stenosis and is following annually.  He denies any new or changing abdominal or back pain.  He also denies any tissue changes of bilateral lower extremities.  He is on aspirin daily.  He is also on Eliquis due to DVT related to COVID infection earlier this year.  He denies tobacco use.   Past Medical History:  Diagnosis Date   Hypertension     Past Surgical History:  Procedure Laterality Date   HERNIA REPAIR      Social History   Socioeconomic History   Marital status: Single    Spouse name: Not on file   Number of children: Not on file   Years of education: Not on file   Highest education level: Not on file  Occupational History   Occupation: works for himself  Tobacco Use   Smoking status: Never   Smokeless tobacco: Never  Vaping Use   Vaping Use: Never used  Substance and Sexual Activity   Alcohol use: Yes   Drug use: Never   Sexual activity: Not on file  Other Topics Concern   Not on file  Social History Narrative   Right handed   Lives alone two story home   Drinks caffeine   Social Determinants of Health   Financial Resource Strain: Not on file  Food Insecurity: Not on file  Transportation Needs: Not on file  Physical Activity: Not on file  Stress: Not on file  Social Connections: Not on file  Intimate Partner Violence: Not on file   History reviewed. No pertinent family history.  Current Outpatient Medications  Medication Sig Dispense Refill   amLODipine (NORVASC) 5 MG tablet Take 1 tablet (5 mg total) by mouth daily for 30 days. (Patient taking differently: Take 2.5 mg by mouth daily.) 30 tablet 0   APIXABAN (ELIQUIS) VTE STARTER PACK (10MG  AND 5MG ) TAKE TWO 5MG  TABLETS BY MOUTH 2 TIMES DAILY FOR 7  DAYS. ON DAY 8 SWITCH TO ONE 5MG  TABLET 2 TIMES DAILY 74 tablet 0   aspirin EC 81 MG tablet Take 81 mg by mouth daily. Swallow whole.     cholecalciferol (VITAMIN D3) 25 MCG (1000 UNIT) tablet 1 tablet     famotidine-calcium carbonate-magnesium hydroxide (PEPCID COMPLETE) 10-800-165 MG chewable tablet 1 tablet as needed     Magnesium 500 MG TABS 1 tablet with a meal     Multiple Vitamin (MULTI VITAMIN) TABS 1 tablet     nortriptyline (PAMELOR) 10 MG capsule Take 2 capsules (20 mg total) by mouth at bedtime. 60 capsule 5   rosuvastatin (CRESTOR) 10 MG tablet Take 1 tablet (10 mg total) by mouth daily. 30 tablet 12   thiamine (VITAMIN B-1) 100 MG tablet 1 tablet     No current facility-administered medications for this visit.    No Known Allergies   REVIEW OF SYSTEMS:   [X]  denotes positive finding, [ ]  denotes negative finding Cardiac  Comments:  Chest pain or chest pressure:    Shortness of breath upon exertion:    Short of breath when lying flat:    Irregular heart rhythm:        Vascular    Pain in calf, thigh, or hip brought on by ambulation:  Pain in feet at night that wakes you up from your sleep:     Blood clot in your veins:    Leg swelling:         Pulmonary    Oxygen at home:    Productive cough:     Wheezing:         Neurologic    Sudden weakness in arms or legs:     Sudden numbness in arms or legs:     Sudden onset of difficulty speaking or slurred speech:    Temporary loss of vision in one eye:     Problems with dizziness:         Gastrointestinal    Blood in stool:     Vomited blood:         Genitourinary    Burning when urinating:     Blood in urine:        Psychiatric    Major depression:         Hematologic    Bleeding problems:    Problems with blood clotting too easily:        Skin    Rashes or ulcers:        Constitutional    Fever or chills:      PHYSICAL EXAMINATION:  Vitals:   03/01/21 0851  BP: 132/88  Pulse: 71  Resp: 20   Temp: 98.4 F (36.9 C)  TempSrc: Temporal  SpO2: 99%  Weight: 201 lb 3.2 oz (91.3 kg)  Height: 5\' 10"  (1.778 m)    General:  WDWN in NAD; vital signs documented above Gait: Not observed HENT: WNL, normocephalic Pulmonary: normal non-labored breathing  Cardiac: regular HR Skin: without rashes Extremities: without ischemic changes, without Gangrene , without cellulitis; without open wounds;  Musculoskeletal: no muscle wasting or atrophy  Neurologic: A&O X 3;  No focal weakness or paresthesias are detected Psychiatric:  The pt has Normal affect.   Non-Invasive Vascular Imaging:   2.7cm proximal abd aorta    ASSESSMENT/PLAN:: 70 y.o. male here for screening for AAA  -AAA duplex negative; we will not repeat this study -Follow-up as scheduled in 1 year for carotid artery stenosis -PCP managing history of DVT currently on Eliquis   81, PA-C [facility] [phone]  Clinic MD:   [phone]

## 2021-03-08 ENCOUNTER — Ambulatory Visit: Payer: Medicare HMO | Attending: Internal Medicine

## 2021-03-08 DIAGNOSIS — Z23 Encounter for immunization: Secondary | ICD-10-CM

## 2021-03-08 NOTE — Progress Notes (Signed)
   Covid-19 Vaccination Clinic  Name:  Ryan Franklin    MRN: [id number] DOB: January 19, 1951  03/08/2021  Mr. Ryan Franklin was observed post Covid-19 immunization for 15 minutes without incident. He was provided with Vaccine Information Sheet and instruction to access the V-Safe system.   Mr. Ryan Franklin was instructed to call 911 with any severe reactions post vaccine: Difficulty breathing  Swelling of face and throat  A fast heartbeat  A bad rash all over body  Dizziness and weakness   Immunizations Administered     Name Date Dose VIS Date Route   Pfizer Covid-19 Vaccine Bivalent Booster 03/08/2021  9:24 AM 0.3 mL 12/09/2020 Intramuscular   Manufacturer: [employer], [employer]   Lot: [id number]   NDC: [phone]

## 2021-03-13 ENCOUNTER — Other Ambulatory Visit (HOSPITAL_BASED_OUTPATIENT_CLINIC_OR_DEPARTMENT_OTHER): Payer: Self-pay

## 2021-03-13 MED ORDER — PFIZER COVID-19 VAC BIVALENT 30 MCG/0.3ML IM SUSP
INTRAMUSCULAR | 0 refills | Status: DC
  Filled 2021-03-13: qty 0.3, 1d supply, fill #0

## 2021-03-15 ENCOUNTER — Other Ambulatory Visit (HOSPITAL_BASED_OUTPATIENT_CLINIC_OR_DEPARTMENT_OTHER): Payer: Self-pay

## 2021-05-13 DIAGNOSIS — H35371 Puckering of macula, right eye: Secondary | ICD-10-CM | POA: Diagnosis not present

## 2021-05-13 DIAGNOSIS — H3581 Retinal edema: Secondary | ICD-10-CM | POA: Diagnosis not present

## 2021-05-28 ENCOUNTER — Other Ambulatory Visit: Payer: Self-pay | Admitting: Family Medicine

## 2021-05-28 DIAGNOSIS — I82502 Chronic embolism and thrombosis of unspecified deep veins of left lower extremity: Secondary | ICD-10-CM

## 2021-05-31 ENCOUNTER — Other Ambulatory Visit: Payer: Medicare HMO

## 2021-06-04 ENCOUNTER — Ambulatory Visit
Admission: RE | Admit: 2021-06-04 | Discharge: 2021-06-04 | Disposition: A | Discharge status: Home / Self Care | Payer: Medicare HMO | Source: Ambulatory Visit | Attending: Family Medicine | Admitting: Family Medicine

## 2021-06-04 DIAGNOSIS — I82502 Chronic embolism and thrombosis of unspecified deep veins of left lower extremity: Secondary | ICD-10-CM

## 2021-06-04 DIAGNOSIS — I82432 Acute embolism and thrombosis of left popliteal vein: Secondary | ICD-10-CM | POA: Diagnosis not present

## 2021-06-04 DIAGNOSIS — I82402 Acute embolism and thrombosis of unspecified deep veins of left lower extremity: Secondary | ICD-10-CM | POA: Diagnosis not present

## 2021-06-15 DIAGNOSIS — H524 Presbyopia: Secondary | ICD-10-CM | POA: Diagnosis not present

## 2021-08-03 DIAGNOSIS — Z86718 Personal history of other venous thrombosis and embolism: Secondary | ICD-10-CM | POA: Diagnosis not present

## 2021-08-03 DIAGNOSIS — E7800 Pure hypercholesterolemia, unspecified: Secondary | ICD-10-CM | POA: Diagnosis not present

## 2021-08-03 DIAGNOSIS — K219 Gastro-esophageal reflux disease without esophagitis: Secondary | ICD-10-CM | POA: Diagnosis not present

## 2021-08-03 DIAGNOSIS — I6521 Occlusion and stenosis of right carotid artery: Secondary | ICD-10-CM | POA: Diagnosis not present

## 2021-08-03 DIAGNOSIS — I10 Essential (primary) hypertension: Secondary | ICD-10-CM | POA: Diagnosis not present

## 2021-08-03 DIAGNOSIS — N492 Inflammatory disorders of scrotum: Secondary | ICD-10-CM | POA: Diagnosis not present

## 2021-08-03 DIAGNOSIS — G4489 Other headache syndrome: Secondary | ICD-10-CM | POA: Diagnosis not present

## 2021-08-03 DIAGNOSIS — L309 Dermatitis, unspecified: Secondary | ICD-10-CM | POA: Diagnosis not present

## 2021-08-25 NOTE — Progress Notes (Signed)
NEUROLOGY FOLLOW UP OFFICE NOTE  Ryan Franklin [id number]  Assessment/Plan:   1.  Headache, nonspecific, non-intractable 2.  Thiamine deficiency resolved   1.  Will decrease nortriptyline to 10mg  at bedtime.  If okay in 4-6 weeks, will discontinue.  If headaches increase, will restart and he will contact me for follow up appointment.   Subjective:  Ryan Franklin is a 71 year old right-handed male with HTN and PUD with history of GI bleed and DVT who follows up for headaches.     UPDATE: Last seen on 06/09/2020.  At that time, he was doing well.  Repeat B1 was 21 and he discontinued supplement.    Mild headaches occur 3-4 times a week, lasting within an hour with treatment. 2-3 severe headaches since last visit.  Sometimes has a dull headache first thing in the morning.  No neck pain.  Tries to get 7-8 hours of sleep but may get up in night to go to bathroom.  Feels a little tired during day.  Occurs a couple times a week.    Current NSAIDS:  none Current analgesics:  Tylenol (takes 3-4 times a week) Current triptans:  none Current ergotamine:  none Current anti-emetic:  none Current muscle relaxants:  none Current anti-anxiolytic:  none Current sleep aide:  none Current Antihypertensive medications:  amlodipine Current Antidepressant medications:  nortriptyline 20mg  Current Anticonvulsant medications:  none Current anti-CGRP:  Ubrelvy 100mg  Current Vitamins/Herbal/Supplements:  MVI, magesium 500mg , C, D Current Antihistamines/Decongestants:  none Other therapy:  none Hormone/birth control:  none     HISTORY:  He began having new daily near-persistent headaches in early 2021.  They are usually a severe right-sided non-throbbing pain.  They sometimes wake him up in the middle of the night.  Sometimes he is bed-bound.  On rare occasions, he has noted brief flashes in his left eye.  There is no associated nausea, vomiting, photophobia, phonophobia, osmophobia, speech  disturbance, dizziness, numbness or weakness.  He has some neck tightness.  He was initially taking ibuprofen and ASA daily, but later was changed to tramadol, which he rarely takes (only if needed) but does seem to dull the pain.  No specific triggers.  MRI of brain with and without contrast on 12/10/2019 showed abnormal signal within the dorsal midbrain and pons and cerebellum, suspicious for toxic/metabolic disorders such as Wernicke encephalopathy.  MRA of head was normal.  Denied heavy alcohol use.  Vitamin B1 level was 7 and he was advised to start thiamine 100mg  daily.  Follow up MRI of brain with and without contrast on 05/08/2020 showed no evidence of signal abnormality suggesting that the previous findings were likely artifactual.    He does have a history of different dull frontal headaches and ocular migraines (kaleidoscopic vision, no headache).   10/07/2019 LABS:  Sed rate 13 10/25/2019 CT HEAD WO:  Cortical atrophy but no acute intracranial abnormality.     Past NSAIDS/steroid:  ASA, ibuprofen, prednisone (can't take nsaids now due to Eliquis) Past analgesics:  tramadol Past abortive triptans:  none Past abortive ergotamine:  none Past muscle relaxants:  none Past anti-emetic:  none Past antihypertensive medications:  none Past antidepressant medications:  none Past anticonvulsant medications:  none Past anti-CGRP:  Ubrelvy (ineffective) Past vitamins/Herbal/Supplements:  none Past antihistamines/decongestants:  none Other past therapies:  none  PAST MEDICAL HISTORY: Past Medical History:  Diagnosis Date   Hypertension     MEDICATIONS: Current Outpatient Medications on File Prior to Visit  Medication  Sig Dispense Refill   amLODipine (NORVASC) 5 MG tablet Take 1 tablet (5 mg total) by mouth daily for 30 days. (Patient taking differently: Take 2.5 mg by mouth daily.) 30 tablet 0   APIXABAN (ELIQUIS) VTE STARTER PACK (10MG  AND 5MG ) TAKE TWO 5MG  TABLETS BY MOUTH 2 TIMES  DAILY FOR 7 DAYS. ON DAY 8 SWITCH TO ONE 5MG  TABLET 2 TIMES DAILY 74 tablet 0   aspirin EC 81 MG tablet Take 81 mg by mouth daily. Swallow whole.     cholecalciferol (VITAMIN D3) 25 MCG (1000 UNIT) tablet 1 tablet     COVID-19 mRNA bivalent vaccine, Pfizer, (PFIZER COVID-19 VAC BIVALENT) injection Inject into the muscle. 0.3 mL 0   famotidine-calcium carbonate-magnesium hydroxide (PEPCID COMPLETE) 10-800-165 MG chewable tablet 1 tablet as needed     Magnesium 500 MG TABS 1 tablet with a meal     Multiple Vitamin (MULTI VITAMIN) TABS 1 tablet     nortriptyline (PAMELOR) 10 MG capsule Take 2 capsules (20 mg total) by mouth at bedtime. 60 capsule 5   rosuvastatin (CRESTOR) 10 MG tablet Take 1 tablet (10 mg total) by mouth daily. 30 tablet 12   No current facility-administered medications on file prior to visit.   ALLERGIES: No Known Allergies  FAMILY HISTORY: No family history on file.    Objective:  Blood pressure (!) 149/82, pulse 62, height 5\' 10"  (1.778 m), weight 202 lb (91.6 kg), SpO2 95 %. General: No acute distress.  Patient appears well-groomed.   Head:  Normocephalic/atraumatic Eyes:  Fundi examined but not visualized Neck: supple, no paraspinal tenderness, full range of motion Heart:  Regular rate and rhythm Neurological Exam: alert and oriented to person, place, and time.  Speech fluent and not dysarthric, language intact.  CN II-XII intact. Bulk and tone normal, muscle strength 5/5 throughout.  Sensation to light touch intact.  Deep tendon reflexes 2+ throughout, toes downgoing.  Finger to nose testing intact.  Gait normal, Romberg negative.   , DO  CC: , MD

## 2021-08-26 ENCOUNTER — Ambulatory Visit: Payer: Medicare HMO | Admitting: Neurology

## 2021-08-26 VITALS — BP 149/82 | HR 62 | Ht 70.0 in | Wt 202.0 lb

## 2021-08-26 DIAGNOSIS — R519 Headache, unspecified: Secondary | ICD-10-CM | POA: Diagnosis not present

## 2021-08-26 DIAGNOSIS — E519 Thiamine deficiency, unspecified: Secondary | ICD-10-CM

## 2021-08-27 ENCOUNTER — Encounter: Payer: Self-pay | Admitting: Neurology

## 2021-08-27 NOTE — Patient Instructions (Signed)
Decrease nortriptyline to 10mg  at bedtime.  If okay in 4-6 weeks, discontinue. If headaches return, restart and contact me.

## 2021-09-26 ENCOUNTER — Encounter (HOSPITAL_BASED_OUTPATIENT_CLINIC_OR_DEPARTMENT_OTHER): Payer: Self-pay | Admitting: Emergency Medicine

## 2021-09-26 ENCOUNTER — Other Ambulatory Visit: Payer: Self-pay

## 2021-09-26 ENCOUNTER — Inpatient Hospital Stay (HOSPITAL_BASED_OUTPATIENT_CLINIC_OR_DEPARTMENT_OTHER)
Admission: EM | Admit: 2021-09-26 | Discharge: 2021-09-28 | DRG: 066 | Disposition: A | Discharge status: Home / Self Care | Payer: Medicare HMO | Attending: Internal Medicine | Admitting: Internal Medicine

## 2021-09-26 ENCOUNTER — Emergency Department (HOSPITAL_BASED_OUTPATIENT_CLINIC_OR_DEPARTMENT_OTHER): Payer: Medicare HMO

## 2021-09-26 DIAGNOSIS — R200 Anesthesia of skin: Secondary | ICD-10-CM | POA: Diagnosis not present

## 2021-09-26 DIAGNOSIS — Z006 Encounter for examination for normal comparison and control in clinical research program: Secondary | ICD-10-CM | POA: Diagnosis not present

## 2021-09-26 DIAGNOSIS — M47812 Spondylosis without myelopathy or radiculopathy, cervical region: Secondary | ICD-10-CM | POA: Diagnosis not present

## 2021-09-26 DIAGNOSIS — Z7982 Long term (current) use of aspirin: Secondary | ICD-10-CM

## 2021-09-26 DIAGNOSIS — Z1152 Encounter for screening for COVID-19: Secondary | ICD-10-CM | POA: Diagnosis not present

## 2021-09-26 DIAGNOSIS — I6529 Occlusion and stenosis of unspecified carotid artery: Secondary | ICD-10-CM

## 2021-09-26 DIAGNOSIS — Z86718 Personal history of other venous thrombosis and embolism: Secondary | ICD-10-CM | POA: Diagnosis not present

## 2021-09-26 DIAGNOSIS — R55 Syncope and collapse: Secondary | ICD-10-CM | POA: Diagnosis present

## 2021-09-26 DIAGNOSIS — I6521 Occlusion and stenosis of right carotid artery: Secondary | ICD-10-CM | POA: Diagnosis not present

## 2021-09-26 DIAGNOSIS — G8929 Other chronic pain: Secondary | ICD-10-CM | POA: Diagnosis present

## 2021-09-26 DIAGNOSIS — I10 Essential (primary) hypertension: Secondary | ICD-10-CM | POA: Diagnosis present

## 2021-09-26 DIAGNOSIS — Z79899 Other long term (current) drug therapy: Secondary | ICD-10-CM | POA: Diagnosis not present

## 2021-09-26 DIAGNOSIS — I63511 Cerebral infarction due to unspecified occlusion or stenosis of right middle cerebral artery: Secondary | ICD-10-CM | POA: Diagnosis not present

## 2021-09-26 DIAGNOSIS — G43109 Migraine with aura, not intractable, without status migrainosus: Secondary | ICD-10-CM | POA: Diagnosis present

## 2021-09-26 DIAGNOSIS — I82532 Chronic embolism and thrombosis of left popliteal vein: Secondary | ICD-10-CM | POA: Diagnosis not present

## 2021-09-26 DIAGNOSIS — R519 Headache, unspecified: Secondary | ICD-10-CM | POA: Diagnosis not present

## 2021-09-26 DIAGNOSIS — G459 Transient cerebral ischemic attack, unspecified: Secondary | ICD-10-CM | POA: Diagnosis present

## 2021-09-26 DIAGNOSIS — I639 Cerebral infarction, unspecified: Secondary | ICD-10-CM | POA: Diagnosis not present

## 2021-09-26 DIAGNOSIS — R42 Dizziness and giddiness: Secondary | ICD-10-CM | POA: Diagnosis not present

## 2021-09-26 DIAGNOSIS — Z8616 Personal history of COVID-19: Secondary | ICD-10-CM

## 2021-09-26 DIAGNOSIS — E785 Hyperlipidemia, unspecified: Secondary | ICD-10-CM | POA: Diagnosis not present

## 2021-09-26 DIAGNOSIS — Z823 Family history of stroke: Secondary | ICD-10-CM | POA: Diagnosis not present

## 2021-09-26 DIAGNOSIS — I63411 Cerebral infarction due to embolism of right middle cerebral artery: Secondary | ICD-10-CM | POA: Diagnosis not present

## 2021-09-26 DIAGNOSIS — R29700 NIHSS score 0: Secondary | ICD-10-CM | POA: Diagnosis present

## 2021-09-26 DIAGNOSIS — I6523 Occlusion and stenosis of bilateral carotid arteries: Secondary | ICD-10-CM | POA: Diagnosis not present

## 2021-09-26 HISTORY — DX: Hyperlipidemia, unspecified: E78.5

## 2021-09-26 LAB — DIFFERENTIAL
Abs Immature Granulocytes: 0.01 10*3/uL (ref 0.00–0.07)
Basophils Absolute: 0 10*3/uL (ref 0.0–0.1)
Basophils Relative: 1 %
Eosinophils Absolute: 0.2 10*3/uL (ref 0.0–0.5)
Eosinophils Relative: 4 %
Immature Granulocytes: 0 %
Lymphocytes Relative: 34 %
Lymphs Abs: 1.9 10*3/uL (ref 0.7–4.0)
Monocytes Absolute: 0.7 10*3/uL (ref 0.1–1.0)
Monocytes Relative: 12 %
Neutro Abs: 2.7 10*3/uL (ref 1.7–7.7)
Neutrophils Relative %: 49 %

## 2021-09-26 LAB — URINALYSIS, MICROSCOPIC (REFLEX)
Squamous Epithelial / HPF: NONE SEEN (ref 0–5)
WBC, UA: NONE SEEN WBC/hpf (ref 0–5)

## 2021-09-26 LAB — COMPREHENSIVE METABOLIC PANEL
ALT: 20 U/L (ref 0–44)
AST: 23 U/L (ref 15–41)
Albumin: 3.9 g/dL (ref 3.5–5.0)
Alkaline Phosphatase: 60 U/L (ref 38–126)
Anion gap: 7 (ref 5–15)
BUN: 24 mg/dL — ABNORMAL HIGH (ref 8–23)
CO2: 26 mmol/L (ref 22–32)
Calcium: 9 mg/dL (ref 8.9–10.3)
Chloride: 106 mmol/L (ref 98–111)
Creatinine, Ser: 1.09 mg/dL (ref 0.61–1.24)
GFR, Estimated: >60 mL/min (ref >60)
Glucose, Bld: 94 mg/dL (ref 70–99)
Potassium: 3.8 mmol/L (ref 3.5–5.1)
Sodium: 139 mmol/L (ref 135–145)
Total Bilirubin: 0.7 mg/dL (ref 0.3–1.2)
Total Protein: 7.2 g/dL (ref 6.5–8.1)

## 2021-09-26 LAB — CBC
HCT: 51.5 % (ref 39.0–52.0)
Hemoglobin: 16.9 g/dL (ref 13.0–17.0)
MCH: 27.8 pg (ref 26.0–34.0)
MCHC: 32.8 g/dL (ref 30.0–36.0)
MCV: 84.8 fL (ref 80.0–100.0)
Platelets: 249 10*3/uL (ref 150–400)
RBC: 6.07 MIL/uL — ABNORMAL HIGH (ref 4.22–5.81)
RDW: 15.7 % — ABNORMAL HIGH (ref 11.5–15.5)
WBC: 5.5 10*3/uL (ref 4.0–10.5)
nRBC: 0 % (ref 0.0–0.2)

## 2021-09-26 LAB — RAPID URINE DRUG SCREEN, HOSP PERFORMED
Amphetamines: NOT DETECTED
Barbiturates: NOT DETECTED
Benzodiazepines: NOT DETECTED
Cocaine: NOT DETECTED
Opiates: NOT DETECTED
Tetrahydrocannabinol: NOT DETECTED

## 2021-09-26 LAB — URINALYSIS, ROUTINE W REFLEX MICROSCOPIC
Bilirubin Urine: NEGATIVE
Glucose, UA: NEGATIVE mg/dL
Ketones, ur: NEGATIVE mg/dL
Leukocytes,Ua: NEGATIVE
Nitrite: NEGATIVE
Protein, ur: NEGATIVE mg/dL
Specific Gravity, Urine: 1.015 (ref 1.005–1.030)
pH: 7 (ref 5.0–8.0)

## 2021-09-26 LAB — ETHANOL: Alcohol, Ethyl (B): <10 mg/dL (ref <10)

## 2021-09-26 LAB — RESP PANEL BY RT-PCR (FLU A&B, COVID) ARPGX2
Influenza A by PCR: NEGATIVE
Influenza B by PCR: NEGATIVE
SARS Coronavirus 2 by RT PCR: NEGATIVE

## 2021-09-26 LAB — PROTIME-INR
INR: 1 (ref 0.8–1.2)
Prothrombin Time: 13 seconds (ref 11.4–15.2)

## 2021-09-26 LAB — APTT: aPTT: 30 seconds (ref 24–36)

## 2021-09-26 MED ORDER — AMLODIPINE BESYLATE 5 MG PO TABS
5.0000 mg | ORAL_TABLET | Freq: Every day | ORAL | Status: DC
  Administered 2021-09-27 – 2021-09-28 (×2): 5 mg via ORAL
  Filled 2021-09-26 (×2): qty 1

## 2021-09-26 MED ORDER — NORTRIPTYLINE HCL 10 MG PO CAPS
20.0000 mg | ORAL_CAPSULE | Freq: Every day | ORAL | Status: DC
  Administered 2021-09-26 – 2021-09-27 (×2): 20 mg via ORAL
  Filled 2021-09-26 (×3): qty 2

## 2021-09-26 MED ORDER — SODIUM CHLORIDE 0.9 % IV SOLN: INTRAVENOUS | Status: DC

## 2021-09-26 MED ORDER — ACETAMINOPHEN 160 MG/5ML PO SOLN: 650.0000 mg | ORAL | Status: DC | PRN

## 2021-09-26 MED ORDER — ACETAMINOPHEN 325 MG PO TABS: 650.0000 mg | ORAL_TABLET | ORAL | Status: DC | PRN

## 2021-09-26 MED ORDER — STROKE: EARLY STAGES OF RECOVERY BOOK: Freq: Once | Status: AC

## 2021-09-26 MED ORDER — ASPIRIN 81 MG PO TBEC
81.0000 mg | DELAYED_RELEASE_TABLET | Freq: Every day | ORAL | Status: DC
  Administered 2021-09-27 – 2021-09-28 (×2): 81 mg via ORAL
  Filled 2021-09-26 (×2): qty 1

## 2021-09-26 MED ORDER — ROSUVASTATIN CALCIUM 5 MG PO TABS
10.0000 mg | ORAL_TABLET | Freq: Every day | ORAL | Status: DC
  Administered 2021-09-27 – 2021-09-28 (×2): 10 mg via ORAL
  Filled 2021-09-26 (×2): qty 2

## 2021-09-26 MED ORDER — IOHEXOL 350 MG/ML SOLN
100.0000 mL | Freq: Once | INTRAVENOUS | Status: AC | PRN
  Administered 2021-09-26: 100 mL via INTRAVENOUS

## 2021-09-26 MED ORDER — ACETAMINOPHEN 650 MG RE SUPP: 650.0000 mg | RECTAL | Status: DC | PRN

## 2021-09-26 MED ORDER — SENNOSIDES-DOCUSATE SODIUM 8.6-50 MG PO TABS
1.0000 | ORAL_TABLET | Freq: Every evening | ORAL | Status: DC | PRN
Start: 2021-09-26 — End: 2021-09-28
  Filled 2021-09-26: qty 1

## 2021-09-26 MED ORDER — ENOXAPARIN SODIUM 40 MG/0.4ML IJ SOSY
40.0000 mg | PREFILLED_SYRINGE | Freq: Every day | INTRAMUSCULAR | Status: DC
  Administered 2021-09-26 – 2021-09-27 (×2): 40 mg via SUBCUTANEOUS
  Filled 2021-09-26 (×2): qty 0.4

## 2021-09-26 NOTE — Progress Notes (Signed)
Arrived via [facility] to 3w11. Oriented patient to room and unit routine.  Telemetry applied and patient assisted with menu to get dinner. Patient is alert and oriented X4; no neuro deficits presently; report given at bedside with night RN.

## 2021-09-26 NOTE — Plan of Care (Signed)
Discussed with Dr. Dalene Seltzer, patient who presented with dizziness and a 10-minute episode of left leg numbness, with symptoms essentially resolved other than some possibly mild residual dizziness some.  He has a history of being on Eliquis for DVT, unclear length of anticoagulation and whether he is still on it at this time.  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 Dalene Seltzer was seen in November 22).  At minimum he will need MRI brain.   If there is a stroke he will need a full neurology consultation.   If there is not a stroke he will need TIA work-up admission unless he is already indicated for long-term anticoagulation.   If he is already indicated for long-term anticoagulation, he may follow-up outpatient if MRI brain is negative and his symptoms have resolved  Brooke Dare MD-PhD [facility] [phone]  This is only a brief curbside recommendation

## 2021-09-26 NOTE — ED Notes (Signed)
ED Provider at bedside. 

## 2021-09-26 NOTE — ED Triage Notes (Signed)
Pt arrives pov, steady gait to triage, endorses acute onset dizziness 30 min pta, and left leg numbness for ~ 10 mins that has resolved. Speech clear, reports dizziness decreased but still "lightheaded, and slight HA., bilaterally equal

## 2021-09-26 NOTE — H&P (Signed)
History and Physical    Ryan Franklin VHQ:[id number] DOB: Mar 23, 1951 DOA: 09/26/2021  PCP: [name], MD   Patient coming from: home   Chief Complaint: Lightheaded, left leg numbness   HPI: Ryan Franklin is a pleasant 71 y.o. male with medical history significant for hypertension, hyperlipidemia, chronic headache, carotid artery stenosis, and history of DVT in January 2022 in the setting of COVID infection, now presenting to the emergency department after an episode of lightheadedness and left leg numbness.  The patient reports that he was in his usual state of health and seated at church when the congregation was asked to stand.  Upon standing, he experienced acute lightheadedness, feeling as though he might lose consciousness, and leading him to sit back down.  Lightheadedness began to improve after sitting, but he noticed that his entire left leg was numb.  Numbness lasted approximately 10 minutes and the lightheadedness continued to improve and has eventually resolved.  He denies any palpitations or focal weakness.  He did have an episode several days ago that he describes as momentary loss of coordination when reaching for a glass of water with his left hand.  His right ICA stenosis is followed by vascular surgery and has been stable in the 40 to 59% range.  [facility] ED Course: Upon arrival to the ED, patient is found to be afebrile and saturating well on room air with normal heart rate and stable blood pressure.  EKG features sinus rhythm.  Head CT negative for acute intracranial abnormality and CT of the head and neck is negative for emergent large vessel occlusion or hemodynamically significant stenosis.  BUN is slightly elevated on his basic blood work which is otherwise unremarkable.    ED physician discussed case with neurology who recommended transfer to [facility] for MRI brain, full neurology consultation if positive, and TIA work-up if negative, and less the patient already has  an indication for long-term anticoagulation, in which case he could follow-up as an outpatient if MRI is negative for acute stroke.  Review of Systems:  All other systems reviewed and apart from HPI, are negative.  Past Medical History:  Diagnosis Date   Hyperlipidemia    Hypertension     Past Surgical History:  Procedure Laterality Date   HERNIA REPAIR      Social History:   reports that he has never smoked. He has never used smokeless tobacco. He reports current alcohol use. He reports that he does not use drugs.  No Known Allergies  History reviewed. No pertinent family history.   Prior to Admission medications   Medication Sig Start Date End Date Taking? Authorizing Provider  amLODipine (NORVASC) 5 MG tablet Take 1 tablet (5 mg total) by mouth daily for 30 days. Patient taking differently: Take 2.5 mg by mouth daily. 08/15/18 03/01/21  Liberty Handy, PA-C  APIXABAN Everlene Balls) VTE STARTER PACK (10MG  AND 5MG ) TAKE TWO 5MG  TABLETS BY MOUTH 2 TIMES DAILY FOR 7 DAYS. ON DAY 8 SWITCH TO ONE 5MG  TABLET 2 TIMES DAILY 04/28/20 04/28/21  , PA  aspirin EC 81 MG tablet Take 81 mg by mouth daily. Swallow whole.    [provider]  cholecalciferol (VITAMIN D3) 25 MCG (1000 UNIT) tablet 1 tablet    [provider]  COVID-19 mRNA bivalent vaccine, Pfizer, (PFIZER COVID-19 VAC BIVALENT) injection Inject into the muscle. 03/08/21   04/30/20, MD  famotidine-calcium carbonate-magnesium hydroxide (PEPCID COMPLETE) [phone] MG chewable tablet 1 tablet as needed  [provider]  Magnesium 500 MG TABS 1 tablet with a meal    [provider]  Multiple Vitamin (MULTI VITAMIN) TABS 1 tablet    [provider]  nortriptyline (PAMELOR) 10 MG capsule Take 2 capsules (20 mg total) by mouth at bedtime. 02/05/21   Drema Dallas, DO  rosuvastatin (CRESTOR) 10 MG tablet Take 1 tablet (10 mg total) by mouth daily. 11/19/20   Sherren Kerns, MD    Physical Exam: Vitals:   09/26/21 1700 09/26/21 1757 09/26/21 1903 09/26/21 2000  BP: (!) 146/97 (!) 145/88 (!) 153/101 128/90  Pulse: 67 65  78  Resp: 13 17  20   Temp:  98.2 F (36.8 C)  97.7 F (36.5 C)  TempSrc:  Oral  Oral  SpO2: 95% 99%  97%  Weight:      Height:        Constitutional: NAD, calm  Eyes: PERTLA, lids and conjunctivae normal ENMT: Mucous membranes are moist. Posterior pharynx clear of any exudate or lesions.   Neck: supple, no masses  Respiratory: no wheezing, no crackles. No accessory muscle use.  Cardiovascular: S1 & S2 heard, regular rate and rhythm. No extremity edema.  Abdomen: No distension, no tenderness, soft. Bowel sounds active.  Musculoskeletal: no clubbing / cyanosis. No joint deformity upper and lower extremities.   Skin: no significant rashes, lesions, ulcers. Warm, dry, well-perfused. Neurologic: CN 2-12 grossly intact. Sensation intact. Strength 5/5 in all 4 limbs. Alert and oriented.  Psychiatric: Pleasant. Cooperative.    Labs and Imaging on Admission: I have personally reviewed following labs and imaging studies  CBC: Recent Labs  Lab 09/26/21 1208  WBC 5.5  NEUTROABS 2.7  HGB 16.9  HCT 51.5  MCV 84.8  PLT 249   Basic Metabolic Panel: Recent Labs  Lab 09/26/21 1208  NA 139  K 3.8  CL 106  CO2 26  GLUCOSE 94  BUN 24*  CREATININE 1.09  CALCIUM 9.0   GFR: Estimated Creatinine Clearance: 67 mL/min (by C-G formula based on SCr of 1.09 mg/dL). Liver Function Tests: Recent Labs  Lab 09/26/21 1208  AST 23  ALT 20  ALKPHOS 60  BILITOT 0.7  PROT 7.2  ALBUMIN 3.9   No results for input(s): "LIPASE", "AMYLASE" in the last 168 hours. No results for input(s): "AMMONIA" in the last 168 hours. Coagulation Profile: Recent Labs  Lab 09/26/21 1208  INR 1.0   Cardiac Enzymes: No results for input(s): "CKTOTAL", "CKMB", "CKMBINDEX", "TROPONINI" in the last 168 hours. BNP (last 3 results) No results for  input(s): "PROBNP" in the last 8760 hours. HbA1C: No results for input(s): "HGBA1C" in the last 72 hours. CBG: No results for input(s): "GLUCAP" in the last 168 hours. Lipid Profile: No results for input(s): "CHOL", "HDL", "LDLCALC", "TRIG", "CHOLHDL", "LDLDIRECT" in the last 72 hours. Thyroid Function Tests: No results for input(s): "TSH", "T4TOTAL", "FREET4", "T3FREE", "THYROIDAB" in the last 72 hours. Anemia Panel: No results for input(s): "VITAMINB12", "FOLATE", "FERRITIN", "TIBC", "IRON", "RETICCTPCT" in the last 72 hours. Urine analysis:    Component Value Date/Time   COLORURINE YELLOW 09/26/2021 1336   APPEARANCEUR CLEAR 09/26/2021 1336   LABSPEC 1.015 09/26/2021 1336   PHURINE 7.0 09/26/2021 1336   GLUCOSEU NEGATIVE 09/26/2021 1336   HGBUR TRACE (A) 09/26/2021 1336   BILIRUBINUR NEGATIVE 09/26/2021 1336   KETONESUR NEGATIVE 09/26/2021 1336   PROTEINUR NEGATIVE 09/26/2021 1336   NITRITE NEGATIVE 09/26/2021 1336   LEUKOCYTESUR NEGATIVE 09/26/2021 1336   Sepsis  Labs: @LABRCNTIP (procalcitonin:4,lacticidven:4) ) Recent Results (from the past 240 hour(s))  Resp Panel by RT-PCR (Flu A&B, Covid) Anterior Nasal Swab     Status: None   Collection Time: 09/26/21 12:08 PM   Specimen: Anterior Nasal Swab  Result Value Ref Range Status   SARS Coronavirus 2 by RT PCR NEGATIVE NEGATIVE Final    Comment: (NOTE) SARS-CoV-2 target nucleic acids are NOT DETECTED.  The SARS-CoV-2 RNA is generally detectable in upper respiratory specimens during the acute phase of infection. The lowest concentration of SARS-CoV-2 viral copies this assay can detect is 138 copies/mL. A negative result does not preclude SARS-Cov-2 infection and should not be used as the sole basis for treatment or other patient management decisions. A negative result may occur with  improper specimen collection/handling, submission of specimen other than nasopharyngeal swab, presence of viral mutation(s) within the areas  targeted by this assay, and inadequate number of viral copies(<138 copies/mL). A negative result must be combined with clinical observations, patient history, and epidemiological information. The expected result is Negative.  Fact Sheet for Patients:  09/28/21  Fact Sheet for Healthcare Providers:  BloggerCourse.com  This test is no t yet approved or cleared by the [employer] FDA and  has been authorized for detection and/or diagnosis of SARS-CoV-2 by FDA under an Emergency Use Authorization (EUA). This EUA will remain  in effect (meaning this test can be used) for the duration of the COVID-19 declaration under Section 564(b)(1) of the Act, 21 U.S.C.section 360bbb-3(b)(1), unless the authorization is terminated  or revoked sooner.       Influenza A by PCR NEGATIVE NEGATIVE Final   Influenza B by PCR NEGATIVE NEGATIVE Final    Comment: (NOTE) The Xpert Xpress SARS-CoV-2/FLU/RSV plus assay is intended as an aid in the diagnosis of influenza from Nasopharyngeal swab specimens and should not be used as a sole basis for treatment. Nasal washings and aspirates are unacceptable for Xpert Xpress SARS-CoV-2/FLU/RSV testing.  Fact Sheet for Patients: [place]  Fact Sheet for Healthcare Providers: BloggerCourse.com  This test is not yet approved or cleared by the [employer] FDA and has been authorized for detection and/or diagnosis of SARS-CoV-2 by FDA under an Emergency Use Authorization (EUA). This EUA will remain in effect (meaning this test can be used) for the duration of the COVID-19 declaration under Section 564(b)(1) of the Act, 21 U.S.C. section 360bbb-3(b)(1), unless the authorization is terminated or revoked.  Performed at [facility], [address] [place], [place]      Radiological Exams on Admission: CT ANGIO HEAD NECK W  WO CM  Result Date: 09/26/2021 CLINICAL DATA:  Acute onset of dizziness and left leg numbness lasting 30 minutes. Persistent lightheadedness and headaches. EXAM: CT ANGIOGRAPHY HEAD AND NECK TECHNIQUE: Multidetector CT imaging of the head and neck was performed using the standard protocol during bolus administration of intravenous contrast. Multiplanar CT image reconstructions and MIPs were obtained to evaluate the vascular anatomy. Carotid stenosis measurements (when applicable) are obtained utilizing NASCET criteria, using the distal internal carotid diameter as the denominator. RADIATION DOSE REDUCTION: This exam was performed according to the departmental dose-optimization program which includes automated exposure control, adjustment of the mA and/or kV according to patient size and/or use of iterative reconstruction technique. CONTRAST:  09/28/2021 OMNIPAQUE IOHEXOL 350 MG/ML SOLN COMPARISON:  CTA head and neck 11/13/2020. FINDINGS: CT HEAD FINDINGS Brain: No acute infarct, hemorrhage, or mass lesion is present. No significant white matter lesions are present. Basal  ganglia are within normal limits. Insular ribbon is normal bilaterally. No focal cortical abnormalities are present. The brainstem and cerebellum are within normal limits. No significant extraaxial fluid collection is present. The ventricles are of normal size. Vascular: No hyperdense vessel or unexpected calcification. Skull: Calvarium is intact. No focal lytic or blastic lesions are present. No significant extracranial soft tissue lesion is present. Sinuses: The paranasal sinuses and mastoid air cells are clear. Orbits: The globes and orbits are within normal limits. Review of the MIP images confirms the above findings CTA NECK FINDINGS Aortic arch: 3 vessel arch configuration is present. No significant atherosclerotic changes are present. No stenosis or aneurysm. Right carotid system: The right common carotid artery is within normal limits.  Atherosclerotic changes are present with proximal vessel irregularity but no significant stenosis relative to the more distal vessel. Maximal narrowing is 50%. No significant interval change. Left carotid system: The left common carotid artery is within normal limits. Mild atherosclerotic changes are present in the proximal left ICA without a significant stenosis relative to the more distal vessel. The distal left ICA is within normal limits. Vertebral arteries: The vertebral arteries are codominant. Both vertebral arteries originate from the subclavian arteries without significant stenosis. Skeleton: Degenerative changes of the cervical spine are similar the prior study, most prominent at C3-4, C5-6 and C6-7. No focal osseous lesions are present. Other neck: Soft tissues the neck are otherwise unremarkable. Salivary glands are within normal limits. Thyroid is normal. No significant adenopathy is present. No focal mucosal or submucosal lesions are present. Upper chest: Lung apices are clear. Thoracic inlet is within normal limits. Review of the MIP images confirms the above findings CTA HEAD FINDINGS Anterior circulation: The internal carotid arteries are within normal limits from the skull base through the ICA termini bilaterally. The A1 and M1 segments are normal. The anterior communicating artery is patent. MCA bifurcations are within normal limits bilaterally. ACA and MCA branch vessels are within normal limits. Posterior circulation: The vertebral arteries are codominant. PICA origins are visualized and normal. The vertebrobasilar junction and basilar artery are normal. Both posterior cerebral arteries originate from the basilar tip. The PCA branch vessels within normal limits bilaterally. Venous sinuses: The dural sinuses are patent. The straight sinus deep cerebral veins are intact. Cortical veins are within normal limits. No significant vascular malformation is evident. The right transverse sinus is  dominant. Anatomic variants: None Review of the MIP images confirms the above findings IMPRESSION: 1. No emergent large vessel occlusion. 2. Stable atherosclerotic changes at the carotid bifurcations bilaterally without significant stenosis relative to the more distal vessels. 3. Normal variant CTA Circle of Willis without significant proximal stenosis, aneurysm, or branch vessel occlusion. 4. Stable multilevel degenerative changes of the cervical spine. Electronically Signed   By: Marin Roberts M.D.   On: 09/26/2021 13:41    EKG: Independently reviewed. Sinus rhythm, LAFB.   Assessment/Plan   1. Transient left leg numbness  - Presents after becoming presyncopal on standing and then developing LLE numbness that resolved after ~10 minutes  - No acute findings on head CT or CTA head & neck at [facility]  - Neurology reviewed the case with ED physician and recommended MRI brain, full neuro consult if positive, and TIA workup if negative unless he already has an indication for long-term anticoagulation (does not appear that he does)  - Check MRI brain, continue cardiac monitoring, check echocardiogram, A1c, and lipids, continue ASA and statin    2. Carotid artery stenosis  -  Followed by vascular surgery for right ICA stenosis, 40-59% and stable on CTA in ED today - Continue ASA and statin, vascular follow-up as planned    3. Chronic headache  - Continue nortriptyline  4. Hypertension  - Continue Norvasc   5. Hx of DVT  - Hx of DVT in January 2022 in setting of COVID infection - Completed 6 mos Eliquis, no recurrence    DVT prophylaxis: Lovenox  Code Status: Full  Level of Care: Level of care: Telemetry Medical Family Communication: None present  Disposition Plan:  Patient is from: home  Anticipated d/c is to: home Anticipated d/c date is: Possibly as early 09/27/21  Patient currently: Pending MRI brain, echo, cardiac monitoring, A1c, lipids, neuro checks, therapy assessments  Consults  called: ED MD discussed with neurology  Admission status: Observation     Briscoe Deutscherimothy S Daryl Quiros, MD [facility]  09/26/2021, 9:38 PM

## 2021-09-26 NOTE — ED Provider Notes (Signed)
[facility] EMERGENCY DEPARTMENT Provider Note   CSN: [id number] Arrival date & time: 09/26/21  1156     History  Chief Complaint  Patient presents with   Dizziness    Ryan Franklin is a 71 y.o. male.  HPI     71yo male with history of hypertension, hyperlipidemia, per vascular notes-embolic material on retina, DVT no longer on eliquis, right internal carotid artery stenosis, who presents with sudden onset lightheadedness and left leg numbness.  Was sitting at church when suddenly, 30 minutes PTA developed lightheadedness and left leg numbness. Stood up an dfelt the dizziness then sat down and felt left leg and found it to be numb.  The numbness of his leg lasted about 10 minutes. Reports having brief ocular migraine on the way here with clear kaleidoscope visiual changes for a few minutes.  Reports about last week he also had an episode of brief left arm heaviness.  No visual changes now, no difficulty speaking, no weakness today, no continued numbness. Does have some lightheadedness continuing. Denies vertigo symptoms.     Past Medical History:  Diagnosis Date   Hyperlipidemia    Hypertension     Past Surgical History:  Procedure Laterality Date   HERNIA REPAIR      Home Medications Prior to Admission medications   Medication Sig Start Date End Date Taking? Authorizing Provider  amLODipine (NORVASC) 5 MG tablet Take 1 tablet (5 mg total) by mouth daily for 30 days. Patient taking differently: Take 2.5 mg by mouth daily. 08/15/18 03/01/21  Liberty Handy, PA-C  APIXABAN Everlene Balls) VTE STARTER PACK (10MG  AND 5MG ) TAKE TWO 5MG  TABLETS BY MOUTH 2 TIMES DAILY FOR 7 DAYS. ON DAY 8 SWITCH TO ONE 5MG  TABLET 2 TIMES DAILY 04/28/20 04/28/21  , PA  aspirin EC 81 MG tablet Take 81 mg by mouth daily. Swallow whole.    [provider]  cholecalciferol (VITAMIN D3) 25 MCG (1000 UNIT) tablet 1 tablet    [provider]  COVID-19 mRNA bivalent  vaccine, Pfizer, (PFIZER COVID-19 VAC BIVALENT) injection Inject into the muscle. 03/08/21   04/30/20, MD  famotidine-calcium carbonate-magnesium hydroxide (PEPCID COMPLETE) [id number] MG chewable tablet 1 tablet as needed    [provider]  Magnesium 500 MG TABS 1 tablet with a meal    [provider]  Multiple Vitamin (MULTI VITAMIN) TABS 1 tablet    [provider]  nortriptyline (PAMELOR) 10 MG capsule Take 2 capsules (20 mg total) by mouth at bedtime. 02/05/21   Gailen Shelter, DO  rosuvastatin (CRESTOR) 10 MG tablet Take 1 tablet (10 mg total) by mouth daily. 11/19/20   Judyann Munson, MD      Allergies    Patient has no known allergies.    Review of Systems   Review of Systems  Physical Exam Updated Vital Signs BP (!) 155/102   Pulse 71   Temp 98.1 F (36.7 C) (Oral)   Resp 15   Ht 5\' 10"  (1.778 m)   Wt 86.2 kg   SpO2 97%   BMI 27.26 kg/m  Physical Exam Vitals and nursing note reviewed.  Constitutional:      General: He is not in acute distress.    Appearance: Normal appearance. He is well-developed. He is not ill-appearing or diaphoretic.  HENT:     Head: Normocephalic and atraumatic.  Eyes:     General: No visual field deficit.    Extraocular Movements: Extraocular movements  intact.     Conjunctiva/sclera: Conjunctivae normal.     Pupils: Pupils are equal, round, and reactive to light.  Cardiovascular:     Rate and Rhythm: Normal rate and regular rhythm.     Pulses: Normal pulses.  Pulmonary:     Effort: Pulmonary effort is normal. No respiratory distress.  Abdominal:     General: There is no distension.     Palpations: Abdomen is soft.     Tenderness: There is no abdominal tenderness. There is no guarding.  Musculoskeletal:        General: No swelling or tenderness.     Cervical back: Normal range of motion.  Skin:    General: Skin is warm and dry.     Findings: No erythema or rash.  Neurological:     General: No  focal deficit present.     Mental Status: He is alert and oriented to person, place, and time.     GCS: GCS eye subscore is 4. GCS verbal subscore is 5. GCS motor subscore is 6.     Cranial Nerves: No cranial nerve deficit, dysarthria or facial asymmetry.     Sensory: No sensory deficit.     Motor: No weakness or tremor.     Coordination: Coordination normal. Finger-Nose-Finger Test normal.     Gait: Gait normal.     ED Results / Procedures / Treatments   Labs (all labs ordered are listed, but only abnormal results are displayed) Labs Reviewed  CBC - Abnormal; Notable for the following components:      Result Value   RBC 6.07 (*)    RDW 15.7 (*)    All other components within normal limits  COMPREHENSIVE METABOLIC PANEL - Abnormal; Notable for the following components:   BUN 24 (*)    All other components within normal limits  URINALYSIS, ROUTINE W REFLEX MICROSCOPIC - Abnormal; Notable for the following components:   Hgb urine dipstick TRACE (*)    All other components within normal limits  URINALYSIS, MICROSCOPIC (REFLEX) - Abnormal; Notable for the following components:   Bacteria, UA RARE (*)    All other components within normal limits  RESP PANEL BY RT-PCR (FLU A&B, COVID) ARPGX2  PROTIME-INR  APTT  DIFFERENTIAL  RAPID URINE DRUG SCREEN, HOSP PERFORMED  ETHANOL  CBG MONITORING, ED    EKG None  Radiology CT ANGIO HEAD NECK W WO CM  Result Date: 09/26/2021 CLINICAL DATA:  Acute onset of dizziness and left leg numbness lasting 30 minutes. Persistent lightheadedness and headaches. EXAM: CT ANGIOGRAPHY HEAD AND NECK TECHNIQUE: Multidetector CT imaging of the head and neck was performed using the standard protocol during bolus administration of intravenous contrast. Multiplanar CT image reconstructions and MIPs were obtained to evaluate the vascular anatomy. Carotid stenosis measurements (when applicable) are obtained utilizing NASCET criteria, using the distal internal  carotid diameter as the denominator. RADIATION DOSE REDUCTION: This exam was performed according to the departmental dose-optimization program which includes automated exposure control, adjustment of the mA and/or kV according to patient size and/or use of iterative reconstruction technique. CONTRAST:  OMNIPAQUE IOHEXOL 350 MG/ML SOLN COMPARISON:  CTA head and neck 11/13/2020. FINDINGS: CT HEAD FINDINGS Brain: No acute infarct, hemorrhage, or mass lesion is present. No significant white matter lesions are present. Basal ganglia are within normal limits. Insular ribbon is normal bilaterally. No focal cortical abnormalities are present. The brainstem and cerebellum are within normal limits. No significant extraaxial fluid collection is present. The ventricles are  of normal size. Vascular: No hyperdense vessel or unexpected calcification. Skull: Calvarium is intact. No focal lytic or blastic lesions are present. No significant extracranial soft tissue lesion is present. Sinuses: The paranasal sinuses and mastoid air cells are clear. Orbits: The globes and orbits are within normal limits. Review of the MIP images confirms the above findings CTA NECK FINDINGS Aortic arch: 3 vessel arch configuration is present. No significant atherosclerotic changes are present. No stenosis or aneurysm. Right carotid system: The right common carotid artery is within normal limits. Atherosclerotic changes are present with proximal vessel irregularity but no significant stenosis relative to the more distal vessel. Maximal narrowing is 50%. No significant interval change. Left carotid system: The left common carotid artery is within normal limits. Mild atherosclerotic changes are present in the proximal left ICA without a significant stenosis relative to the more distal vessel. The distal left ICA is within normal limits. Vertebral arteries: The vertebral arteries are codominant. Both vertebral arteries originate from the subclavian  arteries without significant stenosis. Skeleton: Degenerative changes of the cervical spine are similar the prior study, most prominent at C3-4, C5-6 and C6-7. No focal osseous lesions are present. Other neck: Soft tissues the neck are otherwise unremarkable. Salivary glands are within normal limits. Thyroid is normal. No significant adenopathy is present. No focal mucosal or submucosal lesions are present. Upper chest: Lung apices are clear. Thoracic inlet is within normal limits. Review of the MIP images confirms the above findings CTA HEAD FINDINGS Anterior circulation: The internal carotid arteries are within normal limits from the skull base through the ICA termini bilaterally. The A1 and M1 segments are normal. The anterior communicating artery is patent. MCA bifurcations are within normal limits bilaterally. ACA and MCA branch vessels are within normal limits. Posterior circulation: The vertebral arteries are codominant. PICA origins are visualized and normal. The vertebrobasilar junction and basilar artery are normal. Both posterior cerebral arteries originate from the basilar tip. The PCA branch vessels within normal limits bilaterally. Venous sinuses: The dural sinuses are patent. The straight sinus deep cerebral veins are intact. Cortical veins are within normal limits. No significant vascular malformation is evident. The right transverse sinus is dominant. Anatomic variants: None Review of the MIP images confirms the above findings IMPRESSION: 1. No emergent large vessel occlusion. 2. Stable atherosclerotic changes at the carotid bifurcations bilaterally without significant stenosis relative to the more distal vessels. 3. Normal variant CTA Circle of Willis without significant proximal stenosis, aneurysm, or branch vessel occlusion. 4. Stable multilevel degenerative changes of the cervical spine. Electronically Signed   By: Marin Roberts M.D.   On: 09/26/2021 13:41    Procedures Procedures     Medications Ordered in ED Medications  iohexol (OMNIPAQUE) 350 MG/ML injection 100 mL (100 mLs Intravenous Contrast Given 09/26/21 1258)    ED Course/ Medical Decision Making/ A&P                           Medical Decision Making Amount and/or Complexity of Data Reviewed Labs: ordered. Radiology: ordered.  Risk Prescription drug management. Decision regarding hospitalization.   71yo male with history of hypertension, hyperlipidemia, per vascular notes-embolic material on retina, DVT no longer on eliquis, right internal carotid artery stenosis, who presents with sudden onset lightheadedness and left leg numbness.  Leg numbness resolved prior to arrival.  Has some residual lightheadedness but no focal findings on exam, do not feel Code Stroke/tpa appropriate. Ordered CTA given known hx  of stenosis and stroke symptoms and to evaluate for signs of new occlusive disease. CTA shows no evidence of bleed or occlusion.    No significant electrolyte abnormalities.  EKG with sinus rhythm.  Discussed with Dr. Iver Nestle.  Given risk factors, TIA vs CVA symptoms, will admit for continued TIA/stroke work up .           Final Clinical Impression(s) / ED Diagnoses Final diagnoses:  Dizziness  Numbness  TIA (transient ischemic attack)    Rx / DC Orders ED Discharge Orders     None         Alvira Monday, MD 09/26/21 1655

## 2021-09-27 ENCOUNTER — Observation Stay (HOSPITAL_BASED_OUTPATIENT_CLINIC_OR_DEPARTMENT_OTHER): Payer: Medicare HMO

## 2021-09-27 ENCOUNTER — Observation Stay (HOSPITAL_COMMUNITY): Payer: Medicare HMO

## 2021-09-27 ENCOUNTER — Other Ambulatory Visit (HOSPITAL_COMMUNITY): Payer: Medicare HMO

## 2021-09-27 DIAGNOSIS — I63411 Cerebral infarction due to embolism of right middle cerebral artery: Secondary | ICD-10-CM

## 2021-09-27 DIAGNOSIS — I6529 Occlusion and stenosis of unspecified carotid artery: Secondary | ICD-10-CM

## 2021-09-27 DIAGNOSIS — Z86718 Personal history of other venous thrombosis and embolism: Secondary | ICD-10-CM

## 2021-09-27 DIAGNOSIS — I639 Cerebral infarction, unspecified: Secondary | ICD-10-CM | POA: Diagnosis not present

## 2021-09-27 DIAGNOSIS — E785 Hyperlipidemia, unspecified: Secondary | ICD-10-CM | POA: Diagnosis not present

## 2021-09-27 DIAGNOSIS — I82532 Chronic embolism and thrombosis of left popliteal vein: Secondary | ICD-10-CM

## 2021-09-27 DIAGNOSIS — I63511 Cerebral infarction due to unspecified occlusion or stenosis of right middle cerebral artery: Secondary | ICD-10-CM

## 2021-09-27 DIAGNOSIS — R42 Dizziness and giddiness: Secondary | ICD-10-CM | POA: Diagnosis not present

## 2021-09-27 DIAGNOSIS — R519 Headache, unspecified: Secondary | ICD-10-CM | POA: Diagnosis not present

## 2021-09-27 DIAGNOSIS — I10 Essential (primary) hypertension: Secondary | ICD-10-CM | POA: Diagnosis not present

## 2021-09-27 DIAGNOSIS — R200 Anesthesia of skin: Secondary | ICD-10-CM | POA: Diagnosis not present

## 2021-09-27 LAB — LIPID PANEL
Cholesterol: 184 mg/dL (ref 0–200)
HDL: 49 mg/dL (ref >40)
LDL Cholesterol: 107 mg/dL — ABNORMAL HIGH (ref 0–99)
Total CHOL/HDL Ratio: 3.8 RATIO
Triglycerides: 138 mg/dL (ref <150)
VLDL: 28 mg/dL (ref 0–40)

## 2021-09-27 LAB — CBC
HCT: 50.4 % (ref 39.0–52.0)
Hemoglobin: 16.3 g/dL (ref 13.0–17.0)
MCH: 27.4 pg (ref 26.0–34.0)
MCHC: 32.3 g/dL (ref 30.0–36.0)
MCV: 84.8 fL (ref 80.0–100.0)
Platelets: 230 10*3/uL (ref 150–400)
RBC: 5.94 MIL/uL — ABNORMAL HIGH (ref 4.22–5.81)
RDW: 15.4 % (ref 11.5–15.5)
WBC: 4.7 10*3/uL (ref 4.0–10.5)
nRBC: 0 % (ref 0.0–0.2)

## 2021-09-27 LAB — BASIC METABOLIC PANEL
Anion gap: 7 (ref 5–15)
BUN: 19 mg/dL (ref 8–23)
CO2: 24 mmol/L (ref 22–32)
Calcium: 8.5 mg/dL — ABNORMAL LOW (ref 8.9–10.3)
Chloride: 106 mmol/L (ref 98–111)
Creatinine, Ser: 1 mg/dL (ref 0.61–1.24)
GFR, Estimated: >60 mL/min (ref >60)
Glucose, Bld: 89 mg/dL (ref 70–99)
Potassium: 3.7 mmol/L (ref 3.5–5.1)
Sodium: 137 mmol/L (ref 135–145)

## 2021-09-27 LAB — ECHOCARDIOGRAM COMPLETE BUBBLE STUDY
AR max vel: 2.96 cm2
AV Peak grad: 11.4 mmHg
Ao pk vel: 1.69 m/s
Area-P 1/2: 3.19 cm2
Calc EF: 58.6 %
S' Lateral: 2.9 cm
Single Plane A2C EF: 63.1 %
Single Plane A4C EF: 57.7 %

## 2021-09-27 LAB — HEMOGLOBIN A1C
Hgb A1c MFr Bld: 5.4 % (ref 4.8–5.6)
Mean Plasma Glucose: 108.28 mg/dL

## 2021-09-27 LAB — HIV ANTIBODY (ROUTINE TESTING W REFLEX): HIV Screen 4th Generation wRfx: NONREACTIVE

## 2021-09-27 MED ORDER — CLOPIDOGREL BISULFATE 75 MG PO TABS
75.0000 mg | ORAL_TABLET | Freq: Every day | ORAL | Status: DC
  Administered 2021-09-27 – 2021-09-28 (×2): 75 mg via ORAL
  Filled 2021-09-27 (×2): qty 1

## 2021-09-27 MED ORDER — STROKE: EARLY STAGES OF RECOVERY BOOK
Status: AC
  Filled 2021-09-27: qty 1

## 2021-09-27 NOTE — TOC Initial Note (Signed)
Transition of Care [facility]) - Initial/Assessment Note    Patient Details  Name: Ryan Franklin MRN: [id number] Date of Birth: 03/07/1951  [facility]) CM/SW Contact:    Kermit Balo, RN Phone Number: 09/27/2021, 3:30 PM  Clinical Narrative:                 Pt admitted with a stroke. He is from home alone. Pt states he has a good group of friends that will check on him.  Pt drives self. He manages his own medications and denies any issues.  Pt has DME in the home but doesn't currently use any.  No f/u per PT/OT.  Pt states a friend will provide transport home once discharged.   Expected Discharge Plan: Home/Self Care Barriers to Discharge: Continued Medical Work up   Patient Goals and CMS Choice        Expected Discharge Plan and Services Expected Discharge Plan: Home/Self Care       Living arrangements for the past 2 months: Single Family Home                                      Prior Living Arrangements/Services Living arrangements for the past 2 months: Single Family Home Lives with:: Self Patient language and need for interpreter reviewed:: Yes Do you feel safe going back to the place where you live?: Yes          Current home services: DME (Has: walker/ 3 in 1/ shower seat/ wheelchair) Criminal Activity/Legal Involvement Pertinent to Current Situation/Hospitalization: No - Comment as needed  Activities of Daily Living      Permission Sought/Granted                  Emotional Assessment Appearance:: Appears stated age Attitude/Demeanor/Rapport: Engaged Affect (typically observed): Accepting Orientation: : Oriented to Self, Oriented to Place, Oriented to  Time, Oriented to Situation   Psych Involvement: No (comment)  Admission diagnosis:  TIA (transient ischemic attack) [G45.9] Numbness [R20.0] Dizziness [R42] Patient Active Problem List   Diagnosis Date Noted   CVA (cerebral vascular accident) (HCC) 09/27/2021    Hyperlipidemia    Essential hypertension 03/01/2021   Gastroesophageal reflux disease without esophagitis 03/01/2021   Chronic headache 03/01/2021   Low back pain 03/01/2021   Partial retinal artery occlusion, right eye 03/01/2021   Pure hypercholesterolemia 03/01/2021   Retinal artery occlusion 03/01/2021   Carotid stenosis 03/01/2021   PCP:  [name], MD Pharmacy:   [facility] [phone] [address], [place] - [address] AT [facility] [address] [place] [address] Phone: [phone] Fax: [phone]  [facility] [address], [address] [place] [address] Phone: [phone] Fax: [phone]     Social Determinants of Health (SDOH) Interventions    Readmission Risk Interventions     No data to display

## 2021-09-27 NOTE — Care Management Obs Status (Signed)
MEDICARE OBSERVATION STATUS NOTIFICATION   Patient Details  Name: Ryan Franklin MRN: [id number] Date of Birth: 1950-12-14   Medicare Observation Status Notification Given:  Yes    Kermit Balo, RN 09/27/2021, 3:23 PM

## 2021-09-27 NOTE — Progress Notes (Signed)
Lower extremity venous bilateral study completed.   Please see CV Proc for preliminary results.   Thoams Siefert, RDMS, RVT  

## 2021-09-27 NOTE — Plan of Care (Signed)
  Problem: Education: Goal: Knowledge of General Education information will improve Description: Including pain rating scale, medication(s)/side effects and non-pharmacologic comfort measures Outcome: Progressing   Problem: Health Behavior/Discharge Planning: Goal: Ability to manage health-related needs will improve Outcome: Progressing   Problem: Clinical Measurements: Goal: Ability to maintain clinical measurements within normal limits will improve Outcome: Progressing Goal: Will remain free from infection Outcome: Progressing Goal: Diagnostic test results will improve Outcome: Progressing Goal: Respiratory complications will improve Outcome: Progressing Goal: Cardiovascular complication will be avoided Outcome: Progressing   Problem: Activity: Goal: Risk for activity intolerance will decrease Outcome: Progressing   Problem: Nutrition: Goal: Adequate nutrition will be maintained Outcome: Progressing   Problem: Coping: Goal: Level of anxiety will decrease Outcome: Progressing   Problem: Elimination: Goal: Will not experience complications related to bowel motility Outcome: Progressing Goal: Will not experience complications related to urinary retention Outcome: Progressing   Problem: Pain Managment: Goal: General experience of comfort will improve Outcome: Progressing   Problem: Safety: Goal: Ability to remain free from injury will improve Outcome: Progressing   Problem: Skin Integrity: Goal: Risk for impaired skin integrity will decrease Outcome: Progressing   Problem: Education: Goal: Knowledge of disease or condition will improve Outcome: Progressing Goal: Knowledge of secondary prevention will improve (SELECT ALL) Outcome: Progressing Goal: Knowledge of patient specific risk factors will improve (INDIVIDUALIZE FOR PATIENT) Outcome: Progressing   Problem: Coping: Goal: Will verbalize positive feelings about self Outcome: Progressing Goal: Will  identify appropriate support needs Outcome: Progressing   Problem: Health Behavior/Discharge Planning: Goal: Ability to manage health-related needs will improve Outcome: Progressing   Problem: Self-Care: Goal: Ability to communicate needs accurately will improve Outcome: Progressing

## 2021-09-27 NOTE — Consult Note (Signed)
NEUROLOGY CONSULTATION NOTE   Date of service: September 27, 2021 Patient Name: Ryan Franklin MRN:  [id number] DOB:  [birth year] Reason for consult: "lightheadedness and left leg numbness" Requesting physician: Dr. Alvino Chapel _ _ _   _ __   _ __ _ _  __ __   _ __   __ _  History of Present Illness   Ryan Franklin is a 71 y.o. male with PMH significant for  has a past medical history of Hyperlipidemia and Hypertension. who presents with  an episode of lightheadedness and left leg numbness lasting about 30 minutes.  Patient states that several days ago, he experienced a very transient episode of left arm discoordination which resolved spontaneously.  Yesterday, while he was in church, he felt lightheaded when standing and had  to sit down.  He then felt acute onset numbness of the left leg which lasted for about 30 minutes.  Patient states that he is normally active and healthy and works out three times weekly.  He was taking Eliquis at one point due to a DVT in his leg but stopped this in January.  He endorses a history of palpitations but has never been diagnosed with atrial fibrillation.  Stroke workup this admission:  CTH: No acute infarct, hemorrhage or mass lesion  MRI brain: Numerous small acute infarcts in right hemisphere, mild chronic white matter ischemia  CTA: No emergent LVO, stable atherosclerotic changes at carotid bifurcations without significant stenosis  Lipid Panel:  Lab Results  Component Value Date   LDLCALC 107 (H) 09/27/2021   HgbA1c:  Lab Results  Component Value Date   HGBA1C 5.4 09/27/2021    TTE: pending   ROS   Per HPI; all other systems reviewed and are negative  Past History   Past Medical History:  Diagnosis Date   Hyperlipidemia    Hypertension    Past Surgical History:  Procedure Laterality Date   HERNIA REPAIR     History reviewed. No pertinent family history. Social History   Socioeconomic History   Marital status: Single    Spouse  name: Not on file   Number of children: Not on file   Years of education: Not on file   Highest education level: Not on file  Occupational History   Occupation: works for himself  Tobacco Use   Smoking status: Never   Smokeless tobacco: Never  Vaping Use   Vaping Use: Never used  Substance and Sexual Activity   Alcohol use: Yes    Comment: occ   Drug use: Never   Sexual activity: Not on file  Other Topics Concern   Not on file  Social History Narrative   Right handed   Lives alone two story home   Drinks caffeine   Social Determinants of Health   Financial Resource Strain: Not on file  Food Insecurity: Not on file  Transportation Needs: Not on file  Physical Activity: Not on file  Stress: Not on file  Social Connections: Not on file   No Known Allergies  Medications   Medications Prior to Admission  Medication Sig Dispense Refill Last Dose   amLODipine (NORVASC) 5 MG tablet Take 1 tablet (5 mg total) by mouth daily for 30 days. (Patient taking differently: Take 5 mg by mouth every evening.) 30 tablet 0 09/25/2021   aspirin EC 81 MG tablet Take 81 mg by mouth daily. Swallow whole.   09/26/2021   cholecalciferol (VITAMIN D3) 25 MCG (1000 UNIT) tablet Take 1,000  Units by mouth daily.   09/25/2021   famotidine-calcium carbonate-magnesium hydroxide (PEPCID COMPLETE) 10-800-165 MG chewable tablet Chew 1 tablet by mouth every evening.   09/25/2021   Magnesium 500 MG TABS Take 1 tablet by mouth daily.   09/25/2021   Multiple Vitamin (MULTI VITAMIN) TABS Take 1 tablet by mouth daily.   09/25/2021   nortriptyline (PAMELOR) 10 MG capsule Take 2 capsules (20 mg total) by mouth at bedtime. (Patient taking differently: Take 10 mg by mouth at bedtime.) 60 capsule 5 09/25/2021   rosuvastatin (CRESTOR) 10 MG tablet Take 1 tablet (10 mg total) by mouth daily. 30 tablet 12 09/26/2021   triamcinolone cream (KENALOG) 0.1 % Apply 1 Application topically daily as needed (For dermatitis).       APIXABAN (ELIQUIS) VTE STARTER PACK (10MG  AND 5MG ) TAKE TWO 5MG  TABLETS BY MOUTH 2 TIMES DAILY FOR 7 DAYS. ON DAY 8 SWITCH TO ONE 5MG  TABLET 2 TIMES DAILY (Patient not taking: Reported on 09/26/2021) 74 tablet 0 Not Taking   COVID-19 mRNA bivalent vaccine, Pfizer, (PFIZER COVID-19 VAC BIVALENT) injection Inject into the muscle. (Patient not taking: Reported on 09/26/2021) 0.3 mL 0 Completed Course     Vitals   Vitals:   09/26/21 1903 09/26/21 2000 09/26/21 2318 09/27/21 0411  BP: (!) 153/101 128/90 (!) 133/91 114/85  Pulse:  78 64 69  Resp:  20  18  Temp:  97.7 F (36.5 C) (!) 97.4 F (36.3 C) 97.8 F (36.6 C)  TempSrc:  Oral Oral Oral  SpO2:  97% 96% 97%  Weight:      Height:         Body mass index is 27.26 kg/m.  Physical Exam   Physical Exam Gen: A&O x4, NAD Extrem: Nml bulk; no cyanosis, clubbing, or edema.  Neuro: MS: A&O x4. Follows multi-step commands.  Speech: fluid, nondysarthric CN:    I: Deferred   II,III: PERRLA   III,IV,VI: EOMI w/o nystagmus, no ptosis   V: Sensation intact from V1 to V3 to LT   VII: Eyelid closure was full.  Smile symmetric.   VIII: Hearing intact to voice   IX,X: Voice normally   XII: Tongue protrudes midline, no atrophy or fasciculations   Motor:   Normal bulk.  No tremor, rigidity or bradykinesia. No pronator drift.    Strength: Dlt Bic Tri WrE WrF FgS Gr HF KnF KnE PlF DoF    Left 5 5 5 5 5 5 5 5 5 5 5 5     Right 5 5 5 5 5 5 5 5 5 5 5 5     Sensory: Intact to light touch throughout. Symmetric. Propioception intact bilat.    *Coordination:  Finger-to-nose, rapid alternating motions were intact. *Reflexes:  2+ and symmetric throughout without clonus; toes down-going bilat *Gait: deferred  NIHSS  1a Level of Conscious.: 0 1b LOC Questions: 0 1c LOC Commands: 0 2 Best Gaze: 0 3 Visual: 0 4 Facial Palsy: 0 5a Motor Arm - left: 0 5b Motor Arm - Right: 0 6a Motor Leg - Left: 0 6b Motor Leg - Right: 0 7 Limb Ataxia: 0 8  Sensory: 0 9 Best Language: 0 10 Dysarthria: 0 11 Extinct. and Inatten.: 0  TOTAL: 0   Premorbid mRS = 0   Labs    CBC:  Recent Labs  Lab 09/26/21 1208 09/27/21 0355  WBC 5.5 4.7  NEUTROABS 2.7  --   HGB 16.9 16.3  HCT 51.5 50.4  MCV 84.8 84.8  PLT 249 230    Basic Metabolic Panel:  Lab Results  Component Value Date   NA 137 09/27/2021   K 3.7 09/27/2021   CO2 24 09/27/2021   GLUCOSE 89 09/27/2021   BUN 19 09/27/2021   CREATININE 1.00 09/27/2021   CALCIUM 8.5 (L) 09/27/2021   GFRNONAA >60 09/27/2021   GFRAA >60 08/15/2018    Urine Drug Screen:     Component Value Date/Time   LABOPIA NONE DETECTED 09/26/2021 1336   COCAINSCRNUR NONE DETECTED 09/26/2021 1336   LABBENZ NONE DETECTED 09/26/2021 1336   AMPHETMU NONE DETECTED 09/26/2021 1336   THCU NONE DETECTED 09/26/2021 1336   LABBARB NONE DETECTED 09/26/2021 1336    Alcohol Level     Component Value Date/Time   ETH <10 09/26/2021 1916    CT Head without contrast: No acute infarct, hemorrhage or mass  CT angio Head and Neck with contrast: No LVO or hemodynamically significant stenosis  MRI Brain multiple small acute infarcts in right hemisphere  Impression   71 year old patient with transient episode of left hand discoordination followed by transient lightheadedness and left leg numbness.  Currently back to baseline with normal neuro exam.  MRI reveals multiple acute embolic appearing infarcts in right hemisphere.  Patient has history of hypertension and hyperlipidemia and family history of TIA (mother).  He does endorse palpitations but has never been diagnosed with atrial fibrillation.  He has a history of a lower extremity DVT for which he was taking Eliquis until January.  Patient will need full stroke workup as well as antiplatelet therapy and possible 30 day cardiac monitor or loop recorder.  Recommendations   - Permissive HTN x48 hrs from sx onset goal BP <220/110. PRN labetalol or  hydralazine if BP above these parameters. Avoid oral antihypertensives. - TTE w/ bubble - consider TEE to rule out PFO or cardiac thrombus - lower extremity ultrasound - Check A1c and LDL + add statin per guidelines - aspirin and clopidogrel antiplatelet agents - q4 hr neuro checks - STAT head CT for any change in neuro exam - Tele - PT/OT/SLP - Stroke education - Amb referral to neurology upon discharge   - Consider 30 day cardiac monitor or loop recorder ______________________________________________________________________   Thank you for the opportunity to take part in the care of this patient. If you have any further questions, please contact the neurology consultation attending.  Note to be updated by MD  Signed,  Cortney E Ernestina Columbia , MSN, AGACNP-BC [facility] See Amion for schedule and pager information 09/27/2021 9:24 AM   If 7pm- 7am, please page neurology on call as listed in AMION.   Attending Neurohospitalist Addendum Patient seen and examined with APP/Resident. Agree with the history and physical as documented above. Agree with the plan as documented, which I helped formulate. I have edited the note above to reflect my full findings and recommendations. I have independently reviewed the chart, obtained history, review of systems and examined the patient.I have personally reviewed pertinent head/neck/spine imaging (CT/MRI). Please feel free to call with any questions.  Personal review of MRI brain shows multifocal ischemic infarcts in R MCA distribution. CTA H&N shows no hemodynamically significant stenoses. TTE showed grade 1 diastolic dysfunction, no intracardiac clot or other sig abnl. BLE US shows age indeterminate DVT L popliteal vein. He will likely need to be anticoagulated again for DVT but if primary team is amenable would consider waiting until stroke team provides further guidance tomorrow on timing  in the setting of acute ischemic stroke.  Given the size of his strokes, waiting at least 3 days before anticoagulation might provide reasonable balance between benefit of anticoagulation and risk of hemorrhagic conversion of acute ischemic infarcts. Given that his most recent BLE [place] in Feb showed incomplete resolution of L popliteal DVT he does not need to be anticoagulated urgently in my opinion. Stroke team to assume care tmrw.  -- Bing Neighbors, MD [facility] [phone]  If 7pm- 7am, please page neurology on call as listed in AMION.

## 2021-09-27 NOTE — Evaluation (Signed)
Occupational Therapy Evaluation Patient Details Name: Ryan Franklin MRN: [id number] DOB: 04-Jun-1950 Today's Date: 09/27/2021   History of Present Illness Pt is a 71 y/o M preseneting with dizziness and LLE numbness; head CT/CTA negative, MRI revealing numerous acute infarcts scattered in R MCA territory. PMH includes HLD and HTN.   Clinical Impression   Pt independent with ADLs/IADLs and mobility, lives alone and has family/friends available PRN to assist if needed. Pt currently at baseline, independent with ADLs, bed mobility, and hallway distance ambulation. Pt reports all symptoms have resolved since admission. Pt educated on [facility]FA[facility], verbalized understanding. Pt presenting with impairments listed below, will follow acutely.     Recommendations for follow up therapy are one component of a multi-disciplinary discharge planning process, led by the attending physician.  Recommendations may be updated based on patient status, additional functional criteria and insurance authorization.   Follow Up Recommendations  No OT follow up    Assistance Recommended at Discharge PRN  Patient can return home with the following Assistance with cooking/housework    Functional Status Assessment  Patient has had a recent decline in their functional status and demonstrates the ability to make significant improvements in function in a reasonable and predictable amount of time.  Equipment Recommendations  None recommended by OT    Recommendations for Other Services PT consult     Precautions / Restrictions Precautions Precautions: Fall Restrictions Weight Bearing Restrictions: No      Mobility Bed Mobility Overal bed mobility: Independent                  Transfers Overall transfer level: Independent                        Balance Overall balance assessment: No apparent balance deficits (not formally assessed)                                          ADL either performed or assessed with clinical judgement   ADL Overall ADL's : At baseline                                       General ADL Comments: ind     Vision Baseline Vision/History: 1 Wears glasses Ability to See in Adequate Light: 0 Adequate Patient Visual Report: No change from baseline Vision Assessment?: No apparent visual deficits Additional Comments: pt reporting blurry vision accompanying dizziness/numbness during 30 min episode prior to hospital admission, has since resolved     Perception     Praxis      Pertinent Vitals/Pain Pain Assessment Pain Assessment: No/denies pain     Hand Dominance Right   Extremity/Trunk Assessment Upper Extremity Assessment Upper Extremity Assessment: Overall WFL for tasks assessed   Lower Extremity Assessment Lower Extremity Assessment: Defer to PT evaluation   Cervical / Trunk Assessment Cervical / Trunk Assessment: Normal   Communication Communication Communication: No difficulties   Cognition Arousal/Alertness: Awake/alert Behavior During Therapy: WFL for tasks assessed/performed Overall Cognitive Status: Within Functional Limits for tasks assessed                                 General Comments: no difficulties answering PLOF questions, scoring 0 on  SBT indicative of normal cognition     General Comments  VSS on RA    Exercises     Shoulder Instructions      Home Living Family/patient expects to be discharged to:: Private residence Living Arrangements: Alone Available Help at Discharge: Family;Available PRN/intermittently Type of Home: House Home Access: Stairs to enter [employer] of Steps: 7 Entrance Stairs-Rails: Right Home Layout: Two level;Bed/bath upstairs Alternate Level Stairs-Number of Steps: 15   Bathroom Shower/Tub: Tub/shower unit;Curtain         Home Equipment: Wheelchair - Forensic psychologist (2 wheels);BSC/3in1          Prior  Functioning/Environment Prior Level of Function : Independent/Modified Independent;Driving;Working/employed             Mobility Comments: no AD use 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 List:        OT Treatment/Interventions: Environmental manager;Therapeutic exercise    OT Goals(Current goals can be found in the care plan section) Acute Rehab OT Goals Patient Stated Goal: to get better OT Goal Formulation: With patient Time For Goal Achievement: 10/11/21 Potential to Achieve Goals: Good ADL Goals Additional ADL Goal #1: Pt will perform 4 step trailmaking task in prep for ADLs/IADLs  OT Frequency: Min 2X/week    Co-evaluation              AM-PAC OT "6 Clicks" Daily Activity     Outcome Measure Help from another person eating meals?: None Help from another person taking care of personal grooming?: None Help from another person toileting, which includes using toliet, bedpan, or urinal?: None Help from another person bathing (including washing, rinsing, drying)?: None Help from another person to put on and taking off regular upper body clothing?: None Help from another person to put on and taking off regular lower body clothing?: None 6 Click Score: 24   End of Session Nurse Communication: Mobility status  Activity Tolerance: Patient tolerated treatment well Patient left: in bed;with call bell/phone within reach  OT Visit Diagnosis: Other symptoms and signs involving the nervous system (P37.902)                Time: 4097-3532 OT Time Calculation (min): 15 min Charges:  OT General Charges $OT Visit: 1 Visit OT Evaluation $OT Eval Low Complexity: 1 Low  Alfonzo Beers, OTD, OTR/L Acute Rehab [phone]  Rolm Gala Tachina Spoonemore 09/27/2021, 8:14 AM

## 2021-09-27 NOTE — Evaluation (Signed)
Physical Therapy Evaluation and DISCHARGE Patient Details Name: Ryan Franklin MRN: [id number] DOB: 07-08-1950 Today's Date: 09/27/2021  History of Present Illness  Pt is a 71 y/o M preseneting with dizziness and LLE numbness; head CT/CTA negative, MRI revealing numerous acute infarcts scattered in R MCA territory. PMH includes HLD and HTN.   Clinical Impression  Pt admitted with eval. Pt reports all symptoms have resolved. Pt appears to be back to baseline. Pt independent with all mobility including negotiating 15 stairs as he would up to his bedroom. Pt with good multistep command follow, good processing and sequencing, and demo's minimal falls risk by 23/24 on DGI. Pt with no further acute PT needs at this time. PT SIGNING OFF. Please re-consult if needed in future.       Recommendations for follow up therapy are one component of a multi-disciplinary discharge planning process, led by the attending physician.  Recommendations may be updated based on patient status, additional functional criteria and insurance authorization.  Follow Up Recommendations No PT follow up    Assistance Recommended at Discharge None  Patient can return home with the following       Equipment Recommendations None recommended by PT  Recommendations for Other Services       Functional Status Assessment Patient has had a recent decline in their functional status and demonstrates the ability to make significant improvements in function in a reasonable and predictable amount of time.     Precautions / Restrictions Precautions Precautions: Fall Restrictions Weight Bearing Restrictions: No      Mobility  Bed Mobility Overal bed mobility: Independent             General bed mobility comments: no difficulties    Transfers Overall transfer level: Independent                 General transfer comment: HOB flat    Ambulation/Gait Ambulation/Gait assistance: Independent Gait Distance  (Feet): 300 Feet Assistive device: None Gait Pattern/deviations: WFL(Within Functional Limits) Gait velocity: wfl Gait velocity interpretation: >4.37 ft/sec, indicative of normal walking speed   General Gait Details: no episodes of LOB  Stairs Stairs: Yes Stairs assistance: Modified independent (Device/Increase time) Stair Management: One rail Right, Alternating pattern, Forwards Number of Stairs: 15 General stair comments: no difficulties  Wheelchair Mobility    Modified Rankin (Stroke Patients Only) Modified Rankin (Stroke Patients Only) Pre-Morbid Rankin Score: No symptoms Modified Rankin: No symptoms     Balance Overall balance assessment: Needs assistance             Standing balance comment: pt stood at sink to wash hands and brush teet without difficulty or LOB               High Level Balance Comments: tandem walking x 10 feet without LOB or UE support or assist Standardized Balance Assessment Standardized Balance Assessment : Dynamic Gait Index   Dynamic Gait Index Level Surface: Normal Change in Gait Speed: Normal Gait with Horizontal Head Turns: Normal Gait with Vertical Head Turns: Normal Gait and Pivot Turn: Normal Step Over Obstacle: Normal Step Around Obstacles: Normal Steps: Mild Impairment Total Score: 23       Pertinent Vitals/Pain Pain Assessment Pain Assessment: No/denies pain    Home Living Family/patient expects to be discharged to:: Private residence Living Arrangements: Alone Available Help at Discharge: Family;Available PRN/intermittently Type of Home: House Home Access: Stairs to enter Entrance Stairs-Rails: Right Entrance Stairs-Number of Steps: 7 Alternate Level Stairs-Number of Steps: 15  Home Layout: Two level;Bed/bath upstairs Home Equipment: Wheelchair - Forensic psychologist (2 wheels);BSC/3in1      Prior Function Prior Level of Function : Independent/Modified Independent;Driving;Working/employed              Mobility Comments: no AD use ADLs Comments: works as a Nurse, mental health: Right    Extremity/Trunk Assessment   Upper Extremity Assessment Upper Extremity Assessment: Overall WFL for tasks assessed    Lower Extremity Assessment Lower Extremity Assessment: Overall WFL for tasks assessed    Cervical / Trunk Assessment Cervical / Trunk Assessment: Normal  Communication   Communication: No difficulties  Cognition Arousal/Alertness: Awake/alert Behavior During Therapy: WFL for tasks assessed/performed Overall Cognitive Status: Within Functional Limits for tasks assessed                                 General Comments: able to follow multistep commands, able to talk, answer questions and follow directions for balance assessment        General Comments General comments (skin integrity, edema, etc.): VSS    Exercises     Assessment/Plan    PT Assessment Patient does not need any further PT services  PT Problem List         PT Treatment Interventions      PT Goals (Current goals can be found in the Care Plan section)  Acute Rehab PT Goals Patient Stated Goal: home PT Goal Formulation: All assessment and education complete, DC therapy    Frequency       Co-evaluation               AM-PAC PT "6 Clicks" Mobility  Outcome Measure Help needed turning from your back to your side while in a flat bed without using bedrails?: None Help needed moving from lying on your back to sitting on the side of a flat bed without using bedrails?: None Help needed moving to and from a bed to a chair (including a wheelchair)?: None Help needed standing up from a chair using your arms (e.g., wheelchair or bedside chair)?: None Help needed to walk in hospital room?: None Help needed climbing 3-5 steps with a railing? : A Little 6 Click Score: 23    End of Session   Activity Tolerance: Patient tolerated treatment  well Patient left: in bed;with call bell/phone within reach Nurse Communication: Mobility status (pt cleared to amb on own in room and hallways) PT Visit Diagnosis: Unsteadiness on feet (R26.81);Muscle weakness (generalized) (M62.81);Difficulty in walking, not elsewhere classified (R26.2)    Time: 1093-2355 PT Time Calculation (min) (ACUTE ONLY): 21 min   Charges:   PT Evaluation $PT Eval Low Complexity: 1 Low          Lewis Shock, PT, DPT Acute Rehabilitation Services Secure chat preferred Office #: [phone]   Iona Hansen 09/27/2021, 11:21 AM

## 2021-09-27 NOTE — Progress Notes (Signed)
PROGRESS NOTE    Ryan Franklin  XIP:[id number] DOB: 1950/06/17 DOA: 09/26/2021 PCP: [name], MD     Brief Narrative:  Ryan Franklin is a pleasant 71 y.o. male with medical history significant for hypertension, hyperlipidemia, chronic headache, carotid artery stenosis, and history of DVT in January 2022 in the setting of COVID infection (completed eliquis x 6 months in setting of provoked, first time VTE), now presenting to the emergency department after an episode of lightheadedness and left leg numbness.  The patient reports that he was in his usual state of health and seated at church when the congregation was asked to stand.  Upon standing, he experienced acute lightheadedness, feeling as though he might lose consciousness, and leading him to sit back down.  Lightheadedness began to improve after sitting, but he noticed that his entire left leg was numb.  Numbness lasted approximately 10 minutes and the lightheadedness continued to improve and has eventually resolved.  He denies any palpitations or focal weakness.  He did have an episode several days ago that he describes as momentary loss of coordination when reaching for a glass of water with his left hand.  His right ICA stenosis is followed by vascular surgery and has been stable in the 40 to 59% range.  Head CT negative for acute intracranial abnormality and CT of the head and neck is negative for emergent large vessel occlusion or hemodynamically significant stenosis.  MRI brain found numerous small acute infarcts widely scattered throughout the Right MCA territory  New events last 24 hours / Subjective: Patient was seen after working with PT and OT.  He was ambulating around the hallway without any deficits.  Patient's symptoms have all resolved and now he is feeling back to his baseline.  He denies any history of A-fib, but does feel heart palpitations intermittently/infrequently.  Assessment & Plan:    Principal  Problem:   CVA (cerebral vascular accident) [facility]) Active Problems:   Essential hypertension   Chronic headache   Carotid stenosis   Hyperlipidemia   Right MCA stroke -Appreciate neurology/stroke team -PT OT completed evaluation, no follow-up needed -Echocardiogram pending -DVT ultrasound pending -LDL 107 -Hemoglobin A1c 5.4 -Aspirin, Plavix, Crestor  Carotid artery stenosis -Followed by vascular surgery as outpatient  Chronic headache -Nortriptyline  Hypertension -Norvasc  History of DVT -In January 2022 in setting of COVID infection.  Completed 6 months of Eliquis for first-time provoked DVT.  DVT prophylaxis:  enoxaparin (LOVENOX) injection 40 mg Start: 09/26/21 2200  Code Status: Full code Family Communication: No family at bedside Disposition Plan:  Status is: Observation The patient will require care spanning > 2 midnights and should be moved to inpatient because: Await further stroke work-up  Consultants:  Neurology   Antimicrobials:  Anti-infectives (From admission, onward)    None        Objective: Vitals:   09/26/21 2318 09/27/21 0411 09/27/21 0919 09/27/21 1159  BP: (!) 133/91 114/85 119/84 130/73  Pulse: 64 69 78 73  Resp:  18 17 19   Temp: (!) 97.4 F (36.3 C) 97.8 F (36.6 C) 98.2 F (36.8 C) 98.1 F (36.7 C)  TempSrc: Oral Oral Oral Oral  SpO2: 96% 97% 96% 96%  Weight:      Height:        Intake/Output Summary (Last 24 hours) at 09/27/2021 1243 Last data filed at 09/27/2021 0917 Gross per 24 hour  Intake 117 ml  Output --  Net 117 ml   09/29/2021  09/26/21 1210  Weight: 86.2 kg    Examination:  General exam: Appears calm and comfortable  Respiratory system: Clear to auscultation. Respiratory effort normal. No respiratory distress. No conversational dyspnea.  Cardiovascular system: S1 & S2 heard, RRR. No murmurs. No pedal edema. Gastrointestinal system: Abdomen is nondistended, soft and nontender. Normal bowel sounds  heard. Central nervous system: Alert and oriented. No focal neurological deficits. Speech clear.  Extremities: Symmetric in appearance  Skin: No rashes, lesions or ulcers on exposed skin  Psychiatry: Judgement and insight appear normal. Mood & affect appropriate.   Data Reviewed: I have personally reviewed following labs and imaging studies  CBC: Recent Labs  Lab 09/26/21 1208 09/27/21 0355  WBC 5.5 4.7  NEUTROABS 2.7  --   HGB 16.9 16.3  HCT 51.5 50.4  MCV 84.8 84.8  PLT 249 [id number]   Basic Metabolic Panel: Recent Labs  Lab 09/26/21 1208 09/27/21 0355  NA 139 137  K 3.8 3.7  CL 106 106  CO2 26 24  GLUCOSE 94 89  BUN 24* 19  CREATININE 1.09 1.00  CALCIUM 9.0 8.5*   GFR: Estimated Creatinine Clearance: 73 mL/min (by C-G formula based on SCr of 1 mg/dL). Liver Function Tests: Recent Labs  Lab 09/26/21 1208  AST 23  ALT 20  ALKPHOS 60  BILITOT 0.7  PROT 7.2  ALBUMIN 3.9   No results for input(s): "LIPASE", "AMYLASE" in the last 168 hours. No results for input(s): "AMMONIA" in the last 168 hours. Coagulation Profile: Recent Labs  Lab 09/26/21 1208  INR 1.0   Cardiac Enzymes: No results for input(s): "CKTOTAL", "CKMB", "CKMBINDEX", "TROPONINI" in the last 168 hours. BNP (last 3 results) No results for input(s): "PROBNP" in the last 8760 hours. HbA1C: Recent Labs    09/27/21 0355  HGBA1C 5.4   CBG: No results for input(s): "GLUCAP" in the last 168 hours. Lipid Profile: Recent Labs    09/27/21 0355  CHOL 184  HDL 49  LDLCALC 107*  TRIG 138  CHOLHDL 3.8   Thyroid Function Tests: No results for input(s): "TSH", "T4TOTAL", "FREET4", "T3FREE", "THYROIDAB" in the last 72 hours. Anemia Panel: No results for input(s): "VITAMINB12", "FOLATE", "FERRITIN", "TIBC", "IRON", "RETICCTPCT" in the last 72 hours. Sepsis Labs: No results for input(s): "PROCALCITON", "LATICACIDVEN" in the last 168 hours.  Recent Results (from the past 240 hour(s))  Resp Panel by  RT-PCR (Flu A&B, Covid) Anterior Nasal Swab     Status: None   Collection Time: 09/26/21 12:08 PM   Specimen: Anterior Nasal Swab  Result Value Ref Range Status   SARS Coronavirus 2 by RT PCR NEGATIVE NEGATIVE Final    Comment: (NOTE) SARS-CoV-2 target nucleic acids are NOT DETECTED.  The SARS-CoV-2 RNA is generally detectable in upper respiratory specimens during the acute phase of infection. The lowest concentration of SARS-CoV-2 viral copies this assay can detect is 138 copies/mL. A negative result does not preclude SARS-Cov-2 infection and should not be used as the sole basis for treatment or other patient management decisions. A negative result may occur with  improper specimen collection/handling, submission of specimen other than nasopharyngeal swab, presence of viral mutation(s) within the areas targeted by this assay, and inadequate number of viral copies(<138 copies/mL). A negative result must be combined with clinical observations, patient history, and epidemiological information. The expected result is Negative.  Fact Sheet for Patients:  EntrepreneurPulse.com.au  Fact Sheet for Healthcare Providers:  IncredibleEmployment.be  This test is no t yet approved or cleared by the  [place] Architectural [employer] and  has been authorized for detection and/or diagnosis of SARS-CoV-2 by FDA under an Print production planner (EUA). This EUA will remain  in effect (meaning this test can be used) for the duration of the COVID-19 declaration under Section 564(b)(1) of the Act, 21 U.S.C.section 360bbb-3(b)(1), unless the authorization is terminated  or revoked sooner.       Influenza A by PCR NEGATIVE NEGATIVE Final   Influenza B by PCR NEGATIVE NEGATIVE Final    Comment: (NOTE) The Xpert Xpress SARS-CoV-2/FLU/RSV plus assay is intended as an aid in the diagnosis of influenza from Nasopharyngeal swab specimens and should not be used as a sole basis for  treatment. Nasal washings and aspirates are unacceptable for Xpert Xpress SARS-CoV-2/FLU/RSV testing.  Fact Sheet for Patients: EntrepreneurPulse.com.au  Fact Sheet for Healthcare Providers: IncredibleEmployment.be  This test is not yet approved or cleared by the [place] FDA and has been authorized for detection and/or diagnosis of SARS-CoV-2 by FDA under an Emergency Use Authorization (EUA). This EUA will remain in effect (meaning this test can be used) for the duration of the COVID-19 declaration under Section 564(b)(1) of the Act, 21 U.S.C. section 360bbb-3(b)(1), unless the authorization is terminated or revoked.  Performed at [facility], [address] [place], [place] [address]       Radiology Studies: MR BRAIN WO CONTRAST  Result Date: 09/27/2021 CLINICAL DATA:  71 year old male with dizziness, left leg numbness, headache. EXAM: MRI HEAD WITHOUT CONTRAST TECHNIQUE: Multiplanar, multiecho pulse sequences of the brain and surrounding structures were obtained without intravenous contrast. COMPARISON:  CT head and CTA head and neck yesterday. Brain MRI 12/10/2019 and 05/08/2020. FINDINGS: Brain: Stable cerebral volume. There are numerous small scattered foci of abnormal diffusion throughout the right MCA territory, including motor strip involvement on series 5, image 93, widely scattered small cortical foci, central white matter involvement, and punctate right caudate involvement series 5, image 79. No left hemisphere or posterior fossa restricted diffusion identified. T2 and FLAIR hyperintensity in the acutely affected areas with no acute hemorrhage or mass effect identified. Background cerebral gray and white matter signal otherwise appears stable since last year, including a small chronic lacunar infarct of the anterior right external or internal capsule. No definite chronic cortical encephalomalacia or cerebral blood products.  No midline shift, mass effect, evidence of mass lesion, ventriculomegaly, extra-axial collection or acute intracranial hemorrhage. Cervicomedullary junction and pituitary are within normal limits. Vascular: Major intracranial vascular flow voids are stable from last year. Mild chronic generalized intracranial arterial tortuosity. Skull and upper cervical spine: Negative for age visible cervical spine. Visualized bone marrow signal is within normal limits. Sinuses/Orbits: Orbits are stable and negative. There is mild to moderate maxillary and ethmoid sinus mucosal thickening now. Other: Mastoids remain clear. Visible internal auditory structures appear normal. IMPRESSION: 1. Numerous small acute infarcts widely scattered throughout the Right MCA territory. No associated hemorrhage or mass effect. 2. No other vascular territory involvement or acute intracranial abnormality. Underlying mild for age chronic white matter ischemia. Electronically Signed   By: Genevie Ann M.D.   On: 09/27/2021 05:08   CT ANGIO HEAD NECK W WO CM  Result Date: 09/26/2021 CLINICAL DATA:  Acute onset of dizziness and left leg numbness lasting 30 minutes. Persistent lightheadedness and headaches. EXAM: CT ANGIOGRAPHY HEAD AND NECK TECHNIQUE: Multidetector CT imaging of the head and neck was performed using the standard protocol during bolus administration of intravenous contrast. Multiplanar CT image reconstructions and  MIPs were obtained to evaluate the vascular anatomy. Carotid stenosis measurements (when applicable) are obtained utilizing NASCET criteria, using the distal internal carotid diameter as the denominator. RADIATION DOSE REDUCTION: This exam was performed according to the departmental dose-optimization program which includes automated exposure control, adjustment of the mA and/or kV according to patient size and/or use of iterative reconstruction technique. CONTRAST:  OMNIPAQUE IOHEXOL 350 MG/ML SOLN COMPARISON:  CTA head  and neck 11/13/2020. FINDINGS: CT HEAD FINDINGS Brain: No acute infarct, hemorrhage, or mass lesion is present. No significant white matter lesions are present. Basal ganglia are within normal limits. Insular ribbon is normal bilaterally. No focal cortical abnormalities are present. The brainstem and cerebellum are within normal limits. No significant extraaxial fluid collection is present. The ventricles are of normal size. Vascular: No hyperdense vessel or unexpected calcification. Skull: Calvarium is intact. No focal lytic or blastic lesions are present. No significant extracranial soft tissue lesion is present. Sinuses: The paranasal sinuses and mastoid air cells are clear. Orbits: The globes and orbits are within normal limits. Review of the MIP images confirms the above findings CTA NECK FINDINGS Aortic arch: 3 vessel arch configuration is present. No significant atherosclerotic changes are present. No stenosis or aneurysm. Right carotid system: The right common carotid artery is within normal limits. Atherosclerotic changes are present with proximal vessel irregularity but no significant stenosis relative to the more distal vessel. Maximal narrowing is 50%. No significant interval change. Left carotid system: The left common carotid artery is within normal limits. Mild atherosclerotic changes are present in the proximal left ICA without a significant stenosis relative to the more distal vessel. The distal left ICA is within normal limits. Vertebral arteries: The vertebral arteries are codominant. Both vertebral arteries originate from the subclavian arteries without significant stenosis. Skeleton: Degenerative changes of the cervical spine are similar the prior study, most prominent at C3-4, C5-6 and C6-7. No focal osseous lesions are present. Other neck: Soft tissues the neck are otherwise unremarkable. Salivary glands are within normal limits. Thyroid is normal. No significant adenopathy is present. No  focal mucosal or submucosal lesions are present. Upper chest: Lung apices are clear. Thoracic inlet is within normal limits. Review of the MIP images confirms the above findings CTA HEAD FINDINGS Anterior circulation: The internal carotid arteries are within normal limits from the skull base through the ICA termini bilaterally. The A1 and M1 segments are normal. The anterior communicating artery is patent. MCA bifurcations are within normal limits bilaterally. ACA and MCA branch vessels are within normal limits. Posterior circulation: The vertebral arteries are codominant. PICA origins are visualized and normal. The vertebrobasilar junction and basilar artery are normal. Both posterior cerebral arteries originate from the basilar tip. The PCA branch vessels within normal limits bilaterally. Venous sinuses: The dural sinuses are patent. The straight sinus deep cerebral veins are intact. Cortical veins are within normal limits. No significant vascular malformation is evident. The right transverse sinus is dominant. Anatomic variants: None Review of the MIP images confirms the above findings IMPRESSION: 1. No emergent large vessel occlusion. 2. Stable atherosclerotic changes at the carotid bifurcations bilaterally without significant stenosis relative to the more distal vessels. 3. Normal variant CTA Circle of Willis without significant proximal stenosis, aneurysm, or branch vessel occlusion. 4. Stable multilevel degenerative changes of the cervical spine. Electronically Signed   By: Marin Roberts M.D.   On: 09/26/2021 13:41      Scheduled Meds:   stroke: early stages of recovery book  amLODipine  5 mg Oral Daily   aspirin EC  81 mg Oral Daily   clopidogrel  75 mg Oral Daily   enoxaparin (LOVENOX) injection  40 mg Subcutaneous Q2200   nortriptyline  20 mg Oral QHS   rosuvastatin  10 mg Oral Daily   Continuous Infusions:   LOS: 0 days     Dessa Phi, DO [facility] 09/27/2021,  12:43 PM   Available via Epic secure chat 7am-7pm After these hours, please refer to coverage provider listed on [email or web address]

## 2021-09-27 NOTE — Progress Notes (Signed)
SLP Cancellation Note  Patient Details Name: [name] MRN: [id number] DOB: 19-Nov-1950   Cancelled treatment:       Reason Eval/Treat Not Completed: SLP screened, no needs identified, will sign off   Cassidi Modesitt, Riley Nearing 09/27/2021, 3:20 PM

## 2021-09-28 DIAGNOSIS — I6521 Occlusion and stenosis of right carotid artery: Secondary | ICD-10-CM | POA: Diagnosis present

## 2021-09-28 DIAGNOSIS — R29700 NIHSS score 0: Secondary | ICD-10-CM | POA: Diagnosis present

## 2021-09-28 DIAGNOSIS — Z7982 Long term (current) use of aspirin: Secondary | ICD-10-CM | POA: Diagnosis not present

## 2021-09-28 DIAGNOSIS — I10 Essential (primary) hypertension: Secondary | ICD-10-CM | POA: Diagnosis present

## 2021-09-28 DIAGNOSIS — Z1152 Encounter for screening for COVID-19: Secondary | ICD-10-CM | POA: Diagnosis not present

## 2021-09-28 DIAGNOSIS — I63411 Cerebral infarction due to embolism of right middle cerebral artery: Secondary | ICD-10-CM | POA: Diagnosis present

## 2021-09-28 DIAGNOSIS — Z79899 Other long term (current) drug therapy: Secondary | ICD-10-CM | POA: Diagnosis not present

## 2021-09-28 DIAGNOSIS — R200 Anesthesia of skin: Secondary | ICD-10-CM | POA: Diagnosis present

## 2021-09-28 DIAGNOSIS — Z823 Family history of stroke: Secondary | ICD-10-CM | POA: Diagnosis not present

## 2021-09-28 DIAGNOSIS — E785 Hyperlipidemia, unspecified: Secondary | ICD-10-CM | POA: Diagnosis present

## 2021-09-28 DIAGNOSIS — Z006 Encounter for examination for normal comparison and control in clinical research program: Secondary | ICD-10-CM | POA: Diagnosis not present

## 2021-09-28 DIAGNOSIS — G43109 Migraine with aura, not intractable, without status migrainosus: Secondary | ICD-10-CM | POA: Diagnosis present

## 2021-09-28 DIAGNOSIS — R55 Syncope and collapse: Secondary | ICD-10-CM | POA: Diagnosis present

## 2021-09-28 DIAGNOSIS — Z86718 Personal history of other venous thrombosis and embolism: Secondary | ICD-10-CM | POA: Diagnosis not present

## 2021-09-28 DIAGNOSIS — Z8616 Personal history of COVID-19: Secondary | ICD-10-CM | POA: Diagnosis not present

## 2021-09-28 DIAGNOSIS — I63511 Cerebral infarction due to unspecified occlusion or stenosis of right middle cerebral artery: Secondary | ICD-10-CM | POA: Diagnosis present

## 2021-09-28 MED ORDER — ROSUVASTATIN CALCIUM 20 MG PO TABS: 20.0000 mg | ORAL_TABLET | Freq: Every day | ORAL | Status: DC

## 2021-09-28 MED ORDER — ASPIRIN EC 81 MG PO TBEC: 81.0000 mg | DELAYED_RELEASE_TABLET | Freq: Every day | ORAL | 0 refills | Status: AC

## 2021-09-28 MED ORDER — ROSUVASTATIN CALCIUM 20 MG PO TABS: 20.0000 mg | ORAL_TABLET | Freq: Every day | ORAL | 2 refills | Status: DC

## 2021-09-28 MED ORDER — STUDY - OCEANIC-STROKE - ASUNDEXIAN 50 MG OR PLACEBO TABLET (PI-SETHI)
1.0000 | ORAL_TABLET | Freq: Every day | ORAL | Status: DC
  Administered 2021-09-28: 50 mg via ORAL
  Filled 2021-09-28 (×2): qty 1

## 2021-09-28 MED ORDER — CLOPIDOGREL BISULFATE 75 MG PO TABS: 75.0000 mg | ORAL_TABLET | Freq: Every day | ORAL | 2 refills | Status: AC

## 2021-09-28 MED ORDER — STUDY - OCEANIC-STROKE - ASUNDEXIAN 50 MG OR PLACEBO TABLET (PI-SETHI): 1.0000 | ORAL_TABLET | Freq: Every day | ORAL | 0 refills | Status: DC

## 2021-09-28 NOTE — TOC Transition Note (Signed)
Transition of Care [facility]) - CM/SW Discharge Note   Patient Details  Name: Ryan Franklin MRN: [id number] Date of Birth: 1951/01/08  [facility]) CM/SW Contact:  Kermit Balo, RN Phone Number: 09/28/2021, 2:06 PM   Clinical Narrative:    Patient is discharging home with self care. No f/u per PT/OT and no DME needs.  Pt has transportation home.    Final next level of care: Home/Self Care Barriers to Discharge: No Barriers Identified   Patient Goals and CMS Choice        Discharge Placement                       Discharge Plan and Services                                     Social Determinants of Health (SDOH) Interventions     Readmission Risk Interventions     No data to display

## 2021-09-28 NOTE — Progress Notes (Signed)
Discharge instructions discussed with patient. All questions answered. Patient given 1st dose of Study medication and meds from pharmacy. Cell phone and clothing returned. IV removed and telemetry discontinued.  Melony Overly, RN

## 2021-09-28 NOTE — Discharge Instructions (Signed)
Continue aspirin and plavix for 3 weeks, then stop aspirin and continue plavix alone.  Follow up outpatient neurology.  Follow up with outpatient TEE and loop recorder placement (requested at time of discharge).

## 2021-09-28 NOTE — Progress Notes (Addendum)
STROKE TEAM PROGRESS NOTE   INTERVAL HISTORY Awake, alert and oriented. At his baseline. No family at bedside this am. No over night complaints.  Discussed stroke work up and need TEE and loop recorder. As Cardiology unable to see patient until Thursday , he may discharge home and follow up outpatient for TEE and Loop.  He presented with 30-minute episode of left lower extremity numbness.  MRI shows small multiple embolic right frontal MCA branch infarcts.  CT angiogram shows mild atherosclerotic changes but no large vessel stenosis or occlusion.  2D echo shows ejection fraction of 60 to 65%.  Lower extremity venous Doppler shows age and indeterminate clot in the popliteal vein.  Patient had prior history of DVT in April 2022 for which she was treated with Eliquis for 6 to 9 months and he has been off anticoagulation normal.  He denies any symptoms of leg pain swelling or anything suggestive of active DVT at the present time. Vitals:   09/27/21 1651 09/27/21 2028 09/28/21 0331 09/28/21 0729  BP: 137/87 (!) 131/93 131/89 123/87  Pulse: 64 68 77 79  Resp: Temp: 98.2 F (36.8 C) (!) 97.4 F (36.3 C) 98 F (36.7 C) 98.4 F (36.9 C)  TempSrc: Oral Oral Oral Oral  SpO2: 97% 95% 96% 96%  Weight:      Height:       CBC:  Recent Labs  Lab 09/26/21 1208 09/27/21 0355  WBC 5.5 4.7  NEUTROABS 2.7  --   HGB 16.9 16.3  HCT 51.5 50.4  MCV 84.8 84.8  PLT 249 230   Basic Metabolic Panel:  Recent Labs  Lab 09/26/21 1208 09/27/21 0355  NA 139 137  K 3.8 3.7  CL 106 106  CO2 26 24  GLUCOSE 94 89  BUN 24* 19  CREATININE 1.09 1.00  CALCIUM 9.0 8.5*   Lipid Panel: Home Crestor  increased to high dose statin  Crestor Recent Labs  Lab 09/27/21 0355  CHOL 184  TRIG 138  HDL 49  CHOLHDL 3.8  VLDL 28  LDLCALC 130*   HgbA1c:  Recent Labs  Lab 09/27/21 0355  HGBA1C 5.4    Recent Labs  Lab 09/26/21 1916  ETH <10    IMAGING past 24 hours VAS [place] LOWER  EXTREMITY VENOUS (DVT)  Result Date: 09/27/2021  Lower Venous DVT Study Patient Name:  Ryan Franklin  Date of Exam:   09/27/2021 Medical Rec #: [id number]         Accession #:    [id number] Date of Birth: Jul 30, 1950        Patient Gender: M Patient Age:   71 years Exam Location:  [facility] Procedure:      VAS [place] LOWER EXTREMITY VENOUS (DVT) Referring Phys: Bing Neighbors --------------------------------------------------------------------------------  Indications: Embolic stroke, history of DVT.  Comparison Study: 06-04-2021 Prior left lower extremity venous showed incomplete                   resolution of known DVT with residual, chronic-appearing                   thrombus within the left popliteal. Performing Technologist: Jean Rosenthal RDMS, RVT  Examination Guidelines: A complete evaluation includes B-mode imaging, spectral Doppler, color Doppler, and power Doppler as needed of all accessible portions of each vessel. Bilateral testing is considered an integral part of a complete examination. Limited examinations for reoccurring indications may be performed as noted.  The reflux portion of the exam is performed with the patient in reverse Trendelenburg.  +---------+---------------+---------+-----------+----------+--------------+ RIGHT    CompressibilityPhasicitySpontaneityPropertiesThrombus Aging +---------+---------------+---------+-----------+----------+--------------+ CFV      Full           Yes      Yes                                 +---------+---------------+---------+-----------+----------+--------------+ SFJ      Full                                                        +---------+---------------+---------+-----------+----------+--------------+ FV Prox  Full                                                        +---------+---------------+---------+-----------+----------+--------------+ FV Mid   Full                                                         +---------+---------------+---------+-----------+----------+--------------+ FV DistalFull                                                        +---------+---------------+---------+-----------+----------+--------------+ PFV      Full                                                        +---------+---------------+---------+-----------+----------+--------------+ POP      Full           Yes      Yes                                 +---------+---------------+---------+-----------+----------+--------------+ PTV      Full                                                        +---------+---------------+---------+-----------+----------+--------------+ PERO     Full                                                        +---------+---------------+---------+-----------+----------+--------------+ Gastroc  Full                                                        +---------+---------------+---------+-----------+----------+--------------+   +---------+---------------+---------+-----------+----------+-----------------+  LEFT     CompressibilityPhasicitySpontaneityPropertiesThrombus Aging    +---------+---------------+---------+-----------+----------+-----------------+ CFV      Full           Yes      Yes                                    +---------+---------------+---------+-----------+----------+-----------------+ SFJ      Full                                                           +---------+---------------+---------+-----------+----------+-----------------+ FV Prox  Full                                                           +---------+---------------+---------+-----------+----------+-----------------+ FV Mid   Full                                                           +---------+---------------+---------+-----------+----------+-----------------+ FV DistalFull                                                            +---------+---------------+---------+-----------+----------+-----------------+ PFV      Full                                                           +---------+---------------+---------+-----------+----------+-----------------+ POP      Partial        Yes      Yes                  Age Indeterminate +---------+---------------+---------+-----------+----------+-----------------+ PTV      Full                                                           +---------+---------------+---------+-----------+----------+-----------------+ PERO     Full                                                           +---------+---------------+---------+-----------+----------+-----------------+ Gastroc  Full                                                           +---------+---------------+---------+-----------+----------+-----------------+  Summary: RIGHT: - There is no evidence of deep vein thrombosis in the lower extremity.  - No cystic structure found in the popliteal fossa.  LEFT: - Findings consistent with age indeterminate deep vein thrombosis involving the left popliteal vein.  - No cystic structure found in the popliteal fossa.  *See table(s) above for measurements and observations. Electronically signed by Sherald Hess MD on 09/27/2021 at 7:48:46 PM.    Final    ECHOCARDIOGRAM COMPLETE BUBBLE STUDY  Result Date: 09/27/2021    ECHOCARDIOGRAM REPORT   Patient Name:   Ryan Franklin Date of Exam: 09/27/2021 Medical Rec #:  [id number]        Height:       70.0 in Accession #:    [id number]       Weight:       190.0 lb Date of Birth:  11/16/1950       BSA:          2.042 m Patient Age:    70 years         BP:           114/85 mmHg Patient Gender: M                HR:           70 bpm. Exam Location:  Inpatient Procedure: 2D Echo, Cardiac Doppler, Color Doppler and Saline Contrast Bubble            Study Indications:    TIA  History:        Patient has no prior history of  Echocardiogram examinations.                 Risk Factors:Hypertension.  Sonographer:    Cleatis Polka Referring Phys: [id number] Malachi Carl STACK IMPRESSIONS  1. Left ventricular ejection fraction, by estimation, is 60 to 65%. The left ventricle has normal function. The left ventricle has no regional wall motion abnormalities. Left ventricular diastolic parameters are consistent with Grade I diastolic dysfunction (impaired relaxation).  2. Right ventricular systolic function is normal. The right ventricular size is normal.  3. The mitral valve is normal in structure. No evidence of mitral valve regurgitation. No evidence of mitral stenosis.  4. The aortic valve is tricuspid. Aortic valve regurgitation is trivial. Aortic valve sclerosis/calcification is present, without any evidence of aortic stenosis.  5. The inferior vena cava is normal in size with greater than 50% respiratory variability, suggesting right atrial pressure of 3 mmHg.  6. Agitated saline contrast bubble study was negative, with no evidence of any interatrial shunt. FINDINGS  Left Ventricle: Left ventricular ejection fraction, by estimation, is 60 to 65%. The left ventricle has normal function. The left ventricle has no regional wall motion abnormalities. The left ventricular internal cavity size was normal in size. There is  no left ventricular hypertrophy. Left ventricular diastolic parameters are consistent with Grade I diastolic dysfunction (impaired relaxation). Right Ventricle: The right ventricular size is normal. No increase in right ventricular wall thickness. Right ventricular systolic function is normal. Left Atrium: Left atrial size was normal in size. Right Atrium: Right atrial size was normal in size. Pericardium: There is no evidence of pericardial effusion. Mitral Valve: The mitral valve is normal in structure. No evidence of mitral valve regurgitation. No evidence of mitral valve stenosis. Tricuspid Valve: The tricuspid valve is normal in  structure. Tricuspid valve regurgitation is trivial. No evidence of tricuspid stenosis. Aortic Valve: The aortic valve is tricuspid. Aortic  valve regurgitation is trivial. Aortic valve sclerosis/calcification is present, without any evidence of aortic stenosis. Aortic valve peak gradient measures 11.4 mmHg. Pulmonic Valve: The pulmonic valve was normal in structure. Pulmonic valve regurgitation is not visualized. No evidence of pulmonic stenosis. Aorta: The aortic root is normal in size and structure. Venous: The inferior vena cava is normal in size with greater than 50% respiratory variability, suggesting right atrial pressure of 3 mmHg. IAS/Shunts: No atrial level shunt detected by color flow Doppler. Agitated saline contrast was given intravenously to evaluate for intracardiac shunting. Agitated saline contrast bubble study was negative, with no evidence of any interatrial shunt.  LEFT VENTRICLE PLAX 2D LVIDd:         4.00 cm      Diastology LVIDs:         2.90 cm      LV e' medial:    6.53 cm/s LV PW:         1.30 cm      LV E/e' medial:  9.2 LV IVS:        1.30 cm      LV e' lateral:   9.68 cm/s LVOT diam:     2.00 cm      LV E/e' lateral: 6.2 LV SV:         97 LV SV Index:   48 LVOT Area:     3.14 cm  LV Volumes (MOD) LV vol d, MOD A2C: 142.0 ml LV vol d, MOD A4C: 134.0 ml LV vol s, MOD A2C: 52.4 ml LV vol s, MOD A4C: 56.7 ml LV SV MOD A2C:     89.6 ml LV SV MOD A4C:     134.0 ml LV SV MOD BP:      80.6 ml RIGHT VENTRICLE             IVC RV Basal diam:  3.70 cm     IVC diam: 1.80 cm RV Mid diam:    2.70 cm RV S prime:     15.70 cm/s TAPSE (M-mode): 2.9 cm LEFT ATRIUM             Index        RIGHT ATRIUM           Index LA diam:        3.70 cm 1.81 cm/m   RA Area:     14.40 cm LA Vol (A2C):   54.9 ml 26.88 ml/m  RA Volume:   34.00 ml  16.65 ml/m LA Vol (A4C):   34.4 ml 16.84 ml/m LA Biplane Vol: 47.7 ml 23.36 ml/m  AORTIC VALVE AV Area (Vmax): 2.96 cm AV Vmax:        169.00 cm/s AV Peak Grad:   11.4  mmHg LVOT Vmax:      159.00 cm/s LVOT Vmean:     111.000 cm/s LVOT VTI:       0.310 m  AORTA Ao Root diam: 3.30 cm Ao Asc diam:  3.10 cm MITRAL VALVE MV Area (PHT): 3.19 cm    SHUNTS MV Decel Time: 238 msec    Systemic VTI:  0.31 m MV E velocity: 60.40 cm/s  Systemic Diam: 2.00 cm MV A velocity: 85.30 cm/s MV E/A ratio:  0.71 Arvilla Meres MD Electronically signed by Arvilla Meres MD Signature Date/Time: 09/27/2021/3:24:55 PM    Final     PHYSICAL EXAM Mental Status: Orientation: Oriented to person, place, time, and situation  Attention/Concentration: Intact Fund of Knowledge: Intact Language:  Intact fluency, Intact comprehension, and Intact naming Speech: Fluent, Without Dysarthria, and Normal Prosody    Cranial Nerves:  Pupils: Equal and Reactive  Visual Fields: (R) Full to confrontation; (L) Full to confrontation Optic Disc: Poorly visualized  CN III, IV, VI (Extra-Ocular Movements): EOMI, No nystagmus, and Normal saccades CN V: Normal sensation in V1, V2, V3 bilaterally CN VII: Normal, symmetric facial muscle strength bilaterally CN VIII: Auditory acuity intact to bedside testing CN IX/X: Normal palate elevation CN XI: Normal strength of bilateral trapezius and SCM muscles  CN XII: Full strength B/L with tongue-in-cheek testing  Motor:  Strength: 5/5 x 4 extremities Bulk:: Normal with no atrophy or fasciculations  Tone: Normal in the upper and lower extremities Involuntary Movements (asterixis, tremor, etc): Absent  Pronator Drift: Absent  Sensory: Light Touch: Intact in all 4 extremities    Reflexes:                              Right     Left  Plantar Response  Downgoing  Downgoing   Coordination: Finger to Nose: No dysmetria Heel to Shin: No dysmetria  Gait: Deferred to PT/OT  NIH stroke scale 0.  Premorbid modified Rankin score 0   Interval: Shift assessment (06/20 0856) Level of Consciousness (1a.)   : Alert, keenly responsive (06/20 0856) LOC Questions  (1b. )   +: Answers both questions correctly (06/20 0856) LOC Commands (1c. )   + : Performs both tasks correctly (06/20 0856) Best Gaze (2. )  +: Normal (06/20 0856) Visual (3. )  +: No visual loss (06/20 0856) Facial Palsy (4. )    : Normal symmetrical movements (06/20 0856) Motor Arm, Left (5a. )   +: No drift (06/20 0856) Motor Arm, Right (5b. )   +: No drift (06/20 0856) Motor Leg, Left (6a. )   +: No drift (06/20 0856) Motor Leg, Right (6b. )   +: No drift (06/20 0856) Limb Ataxia (7. ): Absent (06/20 0856) Sensory (8. )   +: Normal, no sensory loss (06/20 0856) Best Language (9. )   +: No aphasia (06/20 0856) Dysarthria (10. ): Normal (06/20 0856) Extinction/Inattention (11.)   +: No Abnormality (06/20 0856) Modified SS Total  +: 0 (06/20 0856) Complete NIHSS TOTAL: 0 (06/20 0856)   ASSESSMENT/PLAN Ryan Franklin is a 71 y.o. male with history of hyperlipidemia and hypertension presenting with an episode of lightheadedness and left leg numbness lasting about 30 minutes yesterday.  Patient states that several days ago, he experienced a very transient episode of left arm discoordination which resolved spontaneously. 09/26/21, while he was in church, he felt lightheaded when standing and had  to sit down.  He then felt acute onset numbness of the left leg which lasted for about 30 minutes.  Patient states that he is normally active and healthy and works out three times weekly.  He was taking Eliquis at one point due to a DVT in his leg but stopped this in January as he states he was instructed by his outpatient medical provider. He endorses a history of palpitations but has never been diagnosed with atrial fibrillation.   He will need a TEE and Loop recorder and this has been requested from cardiology. They may not be able to see patient until Thursday and the TEE and Loop are all he is waiting on for stroke work up completion. Discussed may discharge home  and have testing completed  outpatient.    Stroke numerous small right MCA territory scattered infarcts likely secondary due to embolism source will need Loop recorder to determine embolic source CT head No acute abnormality.    CTA head & neck No LVO. Stable atherosclerotic changes at the carotid bifurcations bilaterally without significant stenosis relative to the more distal vessels. MRI  Numerous small acute infarcts widely scattered throughout the right MCA territory. No associated hemorrhage or mass effect. 2D Echo EF 60-65% and no shunt Transcranial Doppler pending LDL 107 HgbA1c 5.4 VTE prophylaxis - Lovenox 40mg  SQ Daily    Diet   Diet Heart Room service appropriate? Yes; Fluid consistency: Thin   aspirin 81 mg daily prior to admission, now on aspirin 81 mg daily and clopidogrel 75 mg daily. Will take dual anti platelets for 21 days and then stop aspirin to continue on Plavix 75mg  ongoing.  Also [facility] Therapy recommendations:  Home no HH needs Disposition:  Home no HH needs  Hypertension Home meds:  Norvasc 5 mg daily Stable Long-term BP goal normotensive  Hyperlipidemia Home meds:  Crestor 10mg , Crestor increased to 20 mg in hospital for high dose statin therapy after stroke LDL 107, goal < 70  Continue statin at discharge  Other Stroke Risk Factors Advanced Age >/= 7465  Migraines  Hospital day # 2  Neurology will follow for Transcranial Doppler, TEE and Loop placement.   JESSICA LYetta Barre. JONES, AGNP-BC 09/28/21 1:04 PM  STROKE MD NOTE :  I have personally obtained history,examined this patient, reviewed notes, independently viewed imaging studies, participated in medical decision making and plan of care.ROS completed by me personally and pertinent positives fully documented  I have made any additions or clarifications directly to the above note. Agree with note above.  Patient presented with transient 30-minute episode of left leg numbness and  MRI scan showed embolic right frontal MCA branch infarcts.  Continue ongoing stroke work-up and patient likely needs a TEE and loop recorder.  This cannot be done in a timely fashion can be done as an outpatient.  Recommend aspirin and Plavix for 3 weeks followed by Plavix alone.  Aggressive risk factor modification.  Patient may also consider possible participation [facility] study (standard antiplatelet therapy plus factor XI inhibitorAsundexian versus standard antiplatelet therapy plus placebo ) and was given information to review and decide.  Greater than 50% time during this 50-minute visit was spent on counseling and coordination of care about his cryptogenic stroke and discussion about evaluation and prevention and treatment and answering questions.  Delia HeadyPramod Bronislaw Switzer, MD Medical Director [facility] Pager: [phone] 09/28/2021 1:45 PM   To contact Stroke Continuity provider, please refer to [email or web address]. After hours, contact General Neurology

## 2021-09-28 NOTE — Progress Notes (Signed)
[place] Investigational Drug Service New Study Start: OCEANIC-STROKE   SUMMARY For more information refer to: ClinicalTrials.gov. Study Identifier: [id number]    A Multicenter, International, Randomized, Placebo Controlled, Double-blind, Parallel Group and Event Driven Phase 3 Study of the Oral FXIa Inhibitor Asundexian ([id number]) for the Prevention of Ischemic Stroke in Male and Male Participants Aged 69 Years and Older After an Acute Non-cardioembolic Ischemic Stroke or High-risk TIA  Brief Summary This study will randomize adult patients with an initial diagnosis of acute non-cardioembolic ischemic stroke or high-risk transient ischemic attack (TIA) to treatment within 72 hours of symptom onset and with the intention to be treated with antiplatelet therapy.  Design Phase 3, Randomized, Interventional, Event-Driven  Intervention Asundexian ([id number]) 50 mg tablets or matching placebo tablets (pink, oval, film-coated tablets  Concomitant Therapy Participants are to receive antiplatelet therapy (single or dual; provider discretion) during the study conduct.  Prohibited Therapy Oral anticoagulation Full dose and/or long-term anticoagulation therapy with heparin or LMWH  Chronic (more than 4 weeks continuous) therapy with NSAIDs during the study conduct Concomitant use of combined P-gp and strong/moderate CYP3A4 inducers Concomitant use of combined P-gp and strong CYP3A4 inhibitors Herbal/traditional medications and/or supplements with known anticoagulant/antiplatelet effects  Anticoagulation Prophylaxis Venous thromboembolism prophylaxis with LMWH or unfractionated heparin for short periods of time (2 weeks) is allowed.  Potential Drug-Drug Interactions Rosuvastatin 20 mg daily or higher or atorvastatin 80 mg (additional monitoring may be warranted due to increased concentrations of rosuvastatin and atorvastatin - asundexian ([id number]) is a weak inhibitor of CYP2C8)  Administration   Can be taken irrespective of food intake. Should be swallowed whole with water, preferably in the morning. CANNOT be crushed or broken.      Plan: Start [asundexian ([id number]) 50 mg tablets or placebo] today. Study medication must be picked up from pharmacy, medication can not be tubed.    Please contact [facility]S if any questions or concerns regarding the study medication.    Alliah Boulanger, PharmD, BCPS Investigational Drug Service Pharmacist  [phone] 

## 2021-09-28 NOTE — TOC CAGE-AID Note (Signed)
Transition of Care [facility]) - CAGE-AID Screening   Patient Details  Name: Ryan Franklin MRN: [id number] Date of Birth: 03-Jan-1951  [facility] CM/SW Contact:    Coralee Pesa, [place] Phone Number: 09/28/2021, 12:10 PM   Clinical Narrative: CSW met with pt at bedside to complete CAGE- AID assessment. Pt notes he was at church PTA. He states he has an occasional glass of wine at night, declines need for resources.   CAGE-AID Screening:    Have You Ever Felt You Ought to Cut Down on Your Drinking or Drug Use?: No Have People Annoyed You By Critizing Your Drinking Or Drug Use?: No Have You Felt Bad Or Guilty About Your Drinking Or Drug Use?: No Have You Ever Had a Drink or Used Drugs First Thing In The Morning to Steady Your Nerves or to Get Rid of a Hangover?: No CAGE-AID Score: 0  Substance Abuse Education Offered: No

## 2021-09-28 NOTE — Discharge Summary (Signed)
Physician Discharge Summary  Ryan Franklin JTT:[id number] DOB: 1950-04-16 DOA: 09/26/2021  PCP: [name], MD  Admit date: 09/26/2021 Discharge date: 09/28/2021  Admitted From: Home Disposition:  Home  Recommendations for Outpatient Follow-up:  Follow up with PCP in 1 week Follow up with neurology outpatient Sent message to cardiology for outpatient TEE and loop recorder placement  Discharge Condition: Stable CODE STATUS: Full code Diet recommendation: Heart healthy diet  Brief/Interim Summary: Ryan Franklin is a pleasant 71 y.o. male with medical history significant for hypertension, hyperlipidemia, chronic headache, carotid artery stenosis, and history of DVT in January 2022 in the setting of COVID infection (completed eliquis x 6 months in setting of provoked, first time VTE), now presenting to the emergency department after an episode of lightheadedness and left leg numbness.  The patient reports that he was in his usual state of health and seated at church when the congregation was asked to stand.  Upon standing, he experienced acute lightheadedness, feeling as though he might lose consciousness, and leading him to sit back down.  Lightheadedness began to improve after sitting, but he noticed that his entire left leg was numb.  Numbness lasted approximately 10 minutes and the lightheadedness continued to improve and has eventually resolved.  He denies any palpitations or focal weakness.  He did have an episode several days ago that he describes as momentary loss of coordination when reaching for a glass of water with his left hand.  His right ICA stenosis is followed by vascular surgery and has been stable in the 40 to 59% range.   Head CT negative for acute intracranial abnormality and CT of the head and neck is negative for emergent large vessel occlusion or hemodynamically significant stenosis.  MRI brain found numerous small acute infarcts widely scattered throughout the Right  MCA territory.   Patient was seen by stroke team.  PT OT SLP evaluation completed.  Patient will follow-up outpatient for loop recorder and TEE for further stroke work-up.  Discharge Diagnoses:   Principal Problem:   CVA (cerebral vascular accident) [facility]) Active Problems:   Essential hypertension   Chronic headache   Carotid stenosis   Hyperlipidemia   Acute right MCA stroke (HCC)   Right MCA stroke -Appreciate neurology/stroke team -PT OT completed evaluation, no follow-up needed -Echocardiogram EF 60 to 65%, grade 1 diastolic dysfunction, agitated saline contrast bubble study negative without evidence of intra-atrial shunt -DVT ultrasound age indeterminant left DVT -LDL 107 -Hemoglobin A1c 5.4 -Aspirin, Plavix for 3 weeks followed by Plavix alone -Crestor dose increased  -Recommended for transcranial Doppler, TEE, loop recorder (requested)  -Follow-up with neurology outpatient, follows with Dr. Everlena Cooper  Carotid artery stenosis -Followed by vascular surgery as outpatient  Chronic headache -Nortriptyline  Hypertension -Norvasc  History of DVT -In January 2022 in setting of COVID infection.  Completed 6 months of Eliquis for first-time provoked DVT. -Repeat ultrasound showed age indeterminant DVT on the left.  Discussed with Dr. Edilia Bo of vascular surgery over the phone.  No role for restarting anticoagulation with residual DVT.  If patient were to develop acute DVT, and he will need to be on lifelong anticoagulation at that time   Discharge Instructions  Discharge Instructions     Call MD for:  difficulty breathing, headache or visual disturbances   Complete by: As directed    Call MD for:  extreme fatigue   Complete by: As directed    Call MD for:  persistant dizziness or light-headedness   Complete  by: As directed    Call MD for:  persistant nausea and vomiting   Complete by: As directed    Call MD for:  severe uncontrolled pain   Complete by: As directed     Call MD for:  temperature >100.4   Complete by: As directed    Diet - low sodium heart healthy   Complete by: As directed    Discharge instructions   Complete by: As directed    You were cared for by a hospitalist during your hospital stay. If you have any questions about your discharge medications or the care you received while you were in the hospital after you are discharged, you can call the unit and ask to speak with the hospitalist on call if the hospitalist that took care of you is not available. Once you are discharged, your primary care physician will handle any further medical issues. Please note that NO REFILLS for any discharge medications will be authorized once you are discharged, as it is imperative that you return to your primary care physician (or establish a relationship with a primary care physician if you do not have one) for your aftercare needs so that they can reassess your need for medications and monitor your lab values.   Increase activity slowly   Complete by: As directed       Allergies as of 09/28/2021   No Known Allergies      Medication List     STOP taking these medications    Eliquis DVT/PE Starter Pack Generic drug: Apixaban Starter Pack (10mg  and 5mg )   Pfizer COVID-19 Vac Bivalent injection Generic drug: COVID-19 mRNA bivalent vaccine )       TAKE these medications    amLODipine 5 MG tablet Commonly known as: NORVASC Take 1 tablet (5 mg total) by mouth daily for 30 days. What changed: when to take this   aspirin EC 81 MG tablet Take 1 tablet (81 mg total) by mouth daily for 21 days. Swallow whole.   cholecalciferol 25 MCG (1000 UNIT) tablet Commonly known as: VITAMIN D3 Take 1,000 Units by mouth daily.   clopidogrel 75 MG tablet Commonly known as: PLAVIX Take 1 tablet (75 mg total) by mouth daily. Start taking on: September 29, 2021   Magnesium 500 MG Tabs Take 1 tablet by mouth daily.   Multi Vitamin Tabs Take 1 tablet by  mouth daily.   nortriptyline 10 MG capsule Commonly known as: PAMELOR Take 2 capsules (20 mg total) by mouth at bedtime. What changed: how much to take   Pepcid Complete 10-800-165 MG chewable tablet Generic drug: famotidine-calcium carbonate-magnesium hydroxide Chew 1 tablet by mouth every evening.   rosuvastatin 20 MG tablet Commonly known as: Crestor Take 1 tablet (20 mg total) by mouth daily. What changed:  medication strength how much to take   triamcinolone cream 0.1 % Commonly known as: KENALOG Apply 1 Application topically daily as needed (For dermatitis).        Follow-up Information     Proofreader, MD. Schedule an appointment as soon as possible for a visit in 1 week(s).   Specialty: Family Medicine Contact information: [phone] [address] [address] [place] [phone]         [id number], DO. Schedule an appointment as soon as possible for a visit in 4 week(s).   Specialty: Neurology Contact information: [address]  [address] [place] [address] [place] [phone]  No Known Allergies  Consultations: Neurology    Procedures/Studies: VAS [place] LOWER EXTREMITY VENOUS (DVT)  Result Date: 09/27/2021  Lower Venous DVT Study Patient Name:  Ryan Franklin  Date of Exam:   09/27/2021 Medical Rec #: [id number]         Accession #:    [id number] Date of Birth: 14-Dec-1950        Patient Gender: M Patient Age:   71 years Exam Location:  [facility] Procedure:      VAS [place] LOWER EXTREMITY VENOUS (DVT) Referring Phys: Bing Neighbors --------------------------------------------------------------------------------  Indications: Embolic stroke, history of DVT.  Comparison Study: 06-04-2021 Prior left lower extremity venous showed incomplete                   resolution of known DVT with residual, chronic-appearing                   thrombus within the left popliteal. Performing Technologist: Jean Rosenthal RDMS, RVT   Examination Guidelines: A complete evaluation includes B-mode imaging, spectral Doppler, color Doppler, and power Doppler as needed of all accessible portions of each vessel. Bilateral testing is considered an integral part of a complete examination. Limited examinations for reoccurring indications may be performed as noted. The reflux portion of the exam is performed with the patient in reverse Trendelenburg.  +---------+---------------+---------+-----------+----------+--------------+ RIGHT    CompressibilityPhasicitySpontaneityPropertiesThrombus Aging +---------+---------------+---------+-----------+----------+--------------+ CFV      Full           Yes      Yes                                 +---------+---------------+---------+-----------+----------+--------------+ SFJ      Full                                                        +---------+---------------+---------+-----------+----------+--------------+ FV Prox  Full                                                        +---------+---------------+---------+-----------+----------+--------------+ FV Mid   Full                                                        +---------+---------------+---------+-----------+----------+--------------+ FV DistalFull                                                        +---------+---------------+---------+-----------+----------+--------------+ PFV      Full                                                        +---------+---------------+---------+-----------+----------+--------------+  POP      Full           Yes      Yes                                 +---------+---------------+---------+-----------+----------+--------------+ PTV      Full                                                        +---------+---------------+---------+-----------+----------+--------------+ PERO     Full                                                         +---------+---------------+---------+-----------+----------+--------------+ Gastroc  Full                                                        +---------+---------------+---------+-----------+----------+--------------+   +---------+---------------+---------+-----------+----------+-----------------+ LEFT     CompressibilityPhasicitySpontaneityPropertiesThrombus Aging    +---------+---------------+---------+-----------+----------+-----------------+ CFV      Full           Yes      Yes                                    +---------+---------------+---------+-----------+----------+-----------------+ SFJ      Full                                                           +---------+---------------+---------+-----------+----------+-----------------+ FV Prox  Full                                                           +---------+---------------+---------+-----------+----------+-----------------+ FV Mid   Full                                                           +---------+---------------+---------+-----------+----------+-----------------+ FV DistalFull                                                           +---------+---------------+---------+-----------+----------+-----------------+ PFV      Full                                                           +---------+---------------+---------+-----------+----------+-----------------+  POP      Partial        Yes      Yes                  Age Indeterminate +---------+---------------+---------+-----------+----------+-----------------+ PTV      Full                                                           +---------+---------------+---------+-----------+----------+-----------------+ PERO     Full                                                           +---------+---------------+---------+-----------+----------+-----------------+ Gastroc  Full                                                            +---------+---------------+---------+-----------+----------+-----------------+     Summary: RIGHT: - There is no evidence of deep vein thrombosis in the lower extremity.  - No cystic structure found in the popliteal fossa.  LEFT: - Findings consistent with age indeterminate deep vein thrombosis involving the left popliteal vein.  - No cystic structure found in the popliteal fossa.  *See table(s) above for measurements and observations. Electronically signed by Sherald Hess MD on 09/27/2021 at 7:48:46 PM.    Final    ECHOCARDIOGRAM COMPLETE BUBBLE STUDY  Result Date: 09/27/2021    ECHOCARDIOGRAM REPORT   Patient Name:   Ryan Franklin Date of Exam: 09/27/2021 Medical Rec #:  [id number]        Height:       70.0 in Accession #:    [id number]       Weight:       190.0 lb Date of Birth:  [birth year]       BSA:          2.042 m Patient Age:    70 years         BP:           114/85 mmHg Patient Gender: M                HR:           70 bpm. Exam Location:  Inpatient Procedure: 2D Echo, Cardiac Doppler, Color Doppler and Saline Contrast Bubble            Study Indications:    TIA  History:        Patient has no prior history of Echocardiogram examinations.                 Risk Factors:Hypertension.  Sonographer:    Cleatis Polka Referring Phys: [id number] Malachi Carl STACK IMPRESSIONS  1. Left ventricular ejection fraction, by estimation, is 60 to 65%. The left ventricle has normal function. The left ventricle has no regional wall motion abnormalities. Left ventricular diastolic parameters are consistent with Grade I diastolic dysfunction (impaired relaxation).  2. Right ventricular systolic function is normal. The right ventricular size is  normal.  3. The mitral valve is normal in structure. No evidence of mitral valve regurgitation. No evidence of mitral stenosis.  4. The aortic valve is tricuspid. Aortic valve regurgitation is trivial. Aortic valve sclerosis/calcification is present, without any  evidence of aortic stenosis.  5. The inferior vena cava is normal in size with greater than 50% respiratory variability, suggesting right atrial pressure of 3 mmHg.  6. Agitated saline contrast bubble study was negative, with no evidence of any interatrial shunt. FINDINGS  Left Ventricle: Left ventricular ejection fraction, by estimation, is 60 to 65%. The left ventricle has normal function. The left ventricle has no regional wall motion abnormalities. The left ventricular internal cavity size was normal in size. There is  no left ventricular hypertrophy. Left ventricular diastolic parameters are consistent with Grade I diastolic dysfunction (impaired relaxation). Right Ventricle: The right ventricular size is normal. No increase in right ventricular wall thickness. Right ventricular systolic function is normal. Left Atrium: Left atrial size was normal in size. Right Atrium: Right atrial size was normal in size. Pericardium: There is no evidence of pericardial effusion. Mitral Valve: The mitral valve is normal in structure. No evidence of mitral valve regurgitation. No evidence of mitral valve stenosis. Tricuspid Valve: The tricuspid valve is normal in structure. Tricuspid valve regurgitation is trivial. No evidence of tricuspid stenosis. Aortic Valve: The aortic valve is tricuspid. Aortic valve regurgitation is trivial. Aortic valve sclerosis/calcification is present, without any evidence of aortic stenosis. Aortic valve peak gradient measures 11.4 mmHg. Pulmonic Valve: The pulmonic valve was normal in structure. Pulmonic valve regurgitation is not visualized. No evidence of pulmonic stenosis. Aorta: The aortic root is normal in size and structure. Venous: The inferior vena cava is normal in size with greater than 50% respiratory variability, suggesting right atrial pressure of 3 mmHg. IAS/Shunts: No atrial level shunt detected by color flow Doppler. Agitated saline contrast was given intravenously to evaluate for  intracardiac shunting. Agitated saline contrast bubble study was negative, with no evidence of any interatrial shunt.  LEFT VENTRICLE PLAX 2D LVIDd:         4.00 cm      Diastology LVIDs:         2.90 cm      LV e' medial:    6.53 cm/s LV PW:         1.30 cm      LV E/e' medial:  9.2 LV IVS:        1.30 cm      LV e' lateral:   9.68 cm/s LVOT diam:     2.00 cm      LV E/e' lateral: 6.2 LV SV:         97 LV SV Index:   48 LVOT Area:     3.14 cm  LV Volumes (MOD) LV vol d, MOD A2C: 142.0 ml LV vol d, MOD A4C: 134.0 ml LV vol s, MOD A2C: 52.4 ml LV vol s, MOD A4C: 56.7 ml LV SV MOD A2C:     89.6 ml LV SV MOD A4C:     134.0 ml LV SV MOD BP:      80.6 ml RIGHT VENTRICLE             IVC RV Basal diam:  3.70 cm     IVC diam: 1.80 cm RV Mid diam:    2.70 cm RV S prime:     15.70 cm/s TAPSE (M-mode): 2.9 cm LEFT ATRIUM  Index        RIGHT ATRIUM           Index LA diam:        3.70 cm 1.81 cm/m   RA Area:     14.40 cm LA Vol (A2C):   54.9 ml 26.88 ml/m  RA Volume:   34.00 ml  16.65 ml/m LA Vol (A4C):   34.4 ml 16.84 ml/m LA Biplane Vol: 47.7 ml 23.36 ml/m  AORTIC VALVE AV Area (Vmax): 2.96 cm AV Vmax:        169.00 cm/s AV Peak Grad:   11.4 mmHg LVOT Vmax:      159.00 cm/s LVOT Vmean:     111.000 cm/s LVOT VTI:       0.310 m  AORTA Ao Root diam: 3.30 cm Ao Asc diam:  3.10 cm MITRAL VALVE MV Area (PHT): 3.19 cm    SHUNTS MV Decel Time: 238 msec    Systemic VTI:  0.31 m MV E velocity: 60.40 cm/s  Systemic Diam: 2.00 cm MV A velocity: 85.30 cm/s MV E/A ratio:  0.71 Arvilla Meres MD Electronically signed by Arvilla Meres MD Signature Date/Time: 09/27/2021/3:24:55 PM    Final    MR BRAIN WO CONTRAST  Result Date: 09/27/2021 CLINICAL DATA:  71 year old male with dizziness, left leg numbness, headache. EXAM: MRI HEAD WITHOUT CONTRAST TECHNIQUE: Multiplanar, multiecho pulse sequences of the brain and surrounding structures were obtained without intravenous contrast. COMPARISON:  CT head and CTA head and  neck yesterday. Brain MRI 12/10/2019 and 05/08/2020. FINDINGS: Brain: Stable cerebral volume. There are numerous small scattered foci of abnormal diffusion throughout the right MCA territory, including motor strip involvement on series 5, image 93, widely scattered small cortical foci, central white matter involvement, and punctate right caudate involvement series 5, image 79. No left hemisphere or posterior fossa restricted diffusion identified. T2 and FLAIR hyperintensity in the acutely affected areas with no acute hemorrhage or mass effect identified. Background cerebral gray and white matter signal otherwise appears stable since last year, including a small chronic lacunar infarct of the anterior right external or internal capsule. No definite chronic cortical encephalomalacia or cerebral blood products. No midline shift, mass effect, evidence of mass lesion, ventriculomegaly, extra-axial collection or acute intracranial hemorrhage. Cervicomedullary junction and pituitary are within normal limits. Vascular: Major intracranial vascular flow voids are stable from last year. Mild chronic generalized intracranial arterial tortuosity. Skull and upper cervical spine: Negative for age visible cervical spine. Visualized bone marrow signal is within normal limits. Sinuses/Orbits: Orbits are stable and negative. There is mild to moderate maxillary and ethmoid sinus mucosal thickening now. Other: Mastoids remain clear. Visible internal auditory structures appear normal. IMPRESSION: 1. Numerous small acute infarcts widely scattered throughout the Right MCA territory. No associated hemorrhage or mass effect. 2. No other vascular territory involvement or acute intracranial abnormality. Underlying mild for age chronic white matter ischemia. Electronically Signed   By: Odessa Fleming M.D.   On: 09/27/2021 05:08   CT ANGIO HEAD NECK W WO CM  Result Date: 09/26/2021 CLINICAL DATA:  Acute onset of dizziness and left leg numbness  lasting 30 minutes. Persistent lightheadedness and headaches. EXAM: CT ANGIOGRAPHY HEAD AND NECK TECHNIQUE: Multidetector CT imaging of the head and neck was performed using the standard protocol during bolus administration of intravenous contrast. Multiplanar CT image reconstructions and MIPs were obtained to evaluate the vascular anatomy. Carotid stenosis measurements (when applicable) are obtained utilizing NASCET criteria, using the distal internal carotid diameter  as the denominator. RADIATION DOSE REDUCTION: This exam was performed according to the departmental dose-optimization program which includes automated exposure control, adjustment of the mA and/or kV according to patient size and/or use of iterative reconstruction technique. CONTRAST:  OMNIPAQUE IOHEXOL 350 MG/ML SOLN COMPARISON:  CTA head and neck 11/13/2020. FINDINGS: CT HEAD FINDINGS Brain: No acute infarct, hemorrhage, or mass lesion is present. No significant white matter lesions are present. Basal ganglia are within normal limits. Insular ribbon is normal bilaterally. No focal cortical abnormalities are present. The brainstem and cerebellum are within normal limits. No significant extraaxial fluid collection is present. The ventricles are of normal size. Vascular: No hyperdense vessel or unexpected calcification. Skull: Calvarium is intact. No focal lytic or blastic lesions are present. No significant extracranial soft tissue lesion is present. Sinuses: The paranasal sinuses and mastoid air cells are clear. Orbits: The globes and orbits are within normal limits. Review of the MIP images confirms the above findings CTA NECK FINDINGS Aortic arch: 3 vessel arch configuration is present. No significant atherosclerotic changes are present. No stenosis or aneurysm. Right carotid system: The right common carotid artery is within normal limits. Atherosclerotic changes are present with proximal vessel irregularity but no significant stenosis  relative to the more distal vessel. Maximal narrowing is 50%. No significant interval change. Left carotid system: The left common carotid artery is within normal limits. Mild atherosclerotic changes are present in the proximal left ICA without a significant stenosis relative to the more distal vessel. The distal left ICA is within normal limits. Vertebral arteries: The vertebral arteries are codominant. Both vertebral arteries originate from the subclavian arteries without significant stenosis. Skeleton: Degenerative changes of the cervical spine are similar the prior study, most prominent at C3-4, C5-6 and C6-7. No focal osseous lesions are present. Other neck: Soft tissues the neck are otherwise unremarkable. Salivary glands are within normal limits. Thyroid is normal. No significant adenopathy is present. No focal mucosal or submucosal lesions are present. Upper chest: Lung apices are clear. Thoracic inlet is within normal limits. Review of the MIP images confirms the above findings CTA HEAD FINDINGS Anterior circulation: The internal carotid arteries are within normal limits from the skull base through the ICA termini bilaterally. The A1 and M1 segments are normal. The anterior communicating artery is patent. MCA bifurcations are within normal limits bilaterally. ACA and MCA branch vessels are within normal limits. Posterior circulation: The vertebral arteries are codominant. PICA origins are visualized and normal. The vertebrobasilar junction and basilar artery are normal. Both posterior cerebral arteries originate from the basilar tip. The PCA branch vessels within normal limits bilaterally. Venous sinuses: The dural sinuses are patent. The straight sinus deep cerebral veins are intact. Cortical veins are within normal limits. No significant vascular malformation is evident. The right transverse sinus is dominant. Anatomic variants: None Review of the MIP images confirms the above findings IMPRESSION: 1. No  emergent large vessel occlusion. 2. Stable atherosclerotic changes at the carotid bifurcations bilaterally without significant stenosis relative to the more distal vessels. 3. Normal variant CTA Circle of Willis without significant proximal stenosis, aneurysm, or branch vessel occlusion. 4. Stable multilevel degenerative changes of the cervical spine. Electronically Signed   By: Marin Roberts M.D.   On: 09/26/2021 13:41       Discharge Exam: Vitals:   09/28/21 0729 09/28/21 1247  BP: 123/87 134/78  Pulse: 79 79  Resp: 18 20  Temp: 98.4 F (36.9 C) 98.4 F (36.9 C)  SpO2: 96%  94%    General: Pt is alert, awake, not in acute distress Cardiovascular: RRR, S1/S2 +, no edema Respiratory: CTA bilaterally, no wheezing, no rhonchi, no respiratory distress, no conversational dyspnea  Abdominal: Soft, NT, ND, bowel sounds + Extremities: no edema, no cyanosis Psych: Normal mood and affect, stable judgement and insight     The results of significant diagnostics from this hospitalization (including imaging, microbiology, ancillary and laboratory) are listed below for reference.     Microbiology: Recent Results (from the past 240 hour(s))  Resp Panel by RT-PCR (Flu A&B, Covid) Anterior Nasal Swab     Status: None   Collection Time: 09/26/21 12:08 PM   Specimen: Anterior Nasal Swab  Result Value Ref Range Status   SARS Coronavirus 2 by RT PCR NEGATIVE NEGATIVE Final    Comment: (NOTE) SARS-CoV-2 target nucleic acids are NOT DETECTED.  The SARS-CoV-2 RNA is generally detectable in upper respiratory specimens during the acute phase of infection. The lowest concentration of SARS-CoV-2 viral copies this assay can detect is 138 copies/mL. A negative result does not preclude SARS-Cov-2 infection and should not be used as the sole basis for treatment or other patient management decisions. A negative result may occur with  improper specimen collection/handling, submission of specimen  other than nasopharyngeal swab, presence of viral mutation(s) within the areas targeted by this assay, and inadequate number of viral copies(<138 copies/mL). A negative result must be combined with clinical observations, patient history, and epidemiological information. The expected result is Negative.  Fact Sheet for Patients:  BloggerCourse.com  Fact Sheet for Healthcare Providers:  SeriousBroker.it  This test is no t yet approved or cleared by the [place] FDA and  has been authorized for detection and/or diagnosis of SARS-CoV-2 by FDA under an Emergency Use Authorization (EUA). This EUA will remain  in effect (meaning this test can be used) for the duration of the COVID-19 declaration under Section 564(b)(1) of the Act, 21 U.S.C.section 360bbb-3(b)(1), unless the authorization is terminated  or revoked sooner.       Influenza A by PCR NEGATIVE NEGATIVE Final   Influenza B by PCR NEGATIVE NEGATIVE Final    Comment: (NOTE) The Xpert Xpress SARS-CoV-2/FLU/RSV plus assay is intended as an aid in the diagnosis of influenza from Nasopharyngeal swab specimens and should not be used as a sole basis for treatment. Nasal washings and aspirates are unacceptable for Xpert Xpress SARS-CoV-2/FLU/RSV testing.  Fact Sheet for Patients: BloggerCourse.com  Fact Sheet for Healthcare Providers: SeriousBroker.it  This test is not yet approved or cleared by the [place] FDA and has been authorized for detection and/or diagnosis of SARS-CoV-2 by FDA under an Emergency Use Authorization (EUA). This EUA will remain in effect (meaning this test can be used) for the duration of the COVID-19 declaration under Section 564(b)(1) of the Act, 21 U.S.C. section 360bbb-3(b)(1), unless the authorization is terminated or revoked.  Performed at [facility], [address]  [place], [place] [address]      Labs: BNP (last 3 results) No results for input(s): "BNP" in the last 8760 hours. Basic Metabolic Panel: Recent Labs  Lab 09/26/21 1208 09/27/21 0355  NA 139 137  K 3.8 3.7  CL 106 106  CO2 26 24  GLUCOSE 94 89  BUN 24* 19  CREATININE 1.09 1.00  CALCIUM 9.0 8.5*   Liver Function Tests: Recent Labs  Lab 09/26/21 1208  AST 23  ALT 20  ALKPHOS 60  BILITOT 0.7  PROT 7.2  ALBUMIN 3.9   No results for input(s): "LIPASE", "AMYLASE" in the last 168 hours. No results for input(s): "AMMONIA" in the last 168 hours. CBC: Recent Labs  Lab 09/26/21 1208 09/27/21 0355  WBC 5.5 4.7  NEUTROABS 2.7  --   HGB 16.9 16.3  HCT 51.5 50.4  MCV 84.8 84.8  PLT 249 230   Cardiac Enzymes: No results for input(s): "CKTOTAL", "CKMB", "CKMBINDEX", "TROPONINI" in the last 168 hours. BNP: Invalid input(s): "POCBNP" CBG: No results for input(s): "GLUCAP" in the last 168 hours. D-Dimer No results for input(s): "DDIMER" in the last 72 hours. Hgb A1c Recent Labs    09/27/21 0355  HGBA1C 5.4   Lipid Profile Recent Labs    09/27/21 0355  CHOL 184  HDL 49  LDLCALC 107*  TRIG 138  CHOLHDL 3.8   Thyroid function studies No results for input(s): "TSH", "T4TOTAL", "T3FREE", "THYROIDAB" in the last 72 hours.  Invalid input(s): "FREET3" Anemia work up No results for input(s): "VITAMINB12", "FOLATE", "FERRITIN", "TIBC", "IRON", "RETICCTPCT" in the last 72 hours. Urinalysis    Component Value Date/Time   COLORURINE YELLOW 09/26/2021 1336   APPEARANCEUR CLEAR 09/26/2021 1336   LABSPEC 1.015 09/26/2021 1336   PHURINE 7.0 09/26/2021 1336   GLUCOSEU NEGATIVE 09/26/2021 1336   HGBUR TRACE (A) 09/26/2021 1336   BILIRUBINUR NEGATIVE 09/26/2021 1336   KETONESUR NEGATIVE 09/26/2021 1336   PROTEINUR NEGATIVE 09/26/2021 1336   NITRITE NEGATIVE 09/26/2021 1336   LEUKOCYTESUR NEGATIVE 09/26/2021 1336   Sepsis Labs Recent Labs  Lab 09/26/21 1208  09/27/21 0355  WBC 5.5 4.7   Microbiology Recent Results (from the past 240 hour(s))  Resp Panel by RT-PCR (Flu A&B, Covid) Anterior Nasal Swab     Status: None   Collection Time: 09/26/21 12:08 PM   Specimen: Anterior Nasal Swab  Result Value Ref Range Status   SARS Coronavirus 2 by RT PCR NEGATIVE NEGATIVE Final    Comment: (NOTE) SARS-CoV-2 target nucleic acids are NOT DETECTED.  The SARS-CoV-2 RNA is generally detectable in upper respiratory specimens during the acute phase of infection. The lowest concentration of SARS-CoV-2 viral copies this assay can detect is 138 copies/mL. A negative result does not preclude SARS-Cov-2 infection and should not be used as the sole basis for treatment or other patient management decisions. A negative result may occur with  improper specimen collection/handling, submission of specimen other than nasopharyngeal swab, presence of viral mutation(s) within the areas targeted by this assay, and inadequate number of viral copies(<138 copies/mL). A negative result must be combined with clinical observations, patient history, and epidemiological information. The expected result is Negative.  Fact Sheet for Patients:  BloggerCourse.comhttps://www.fda.gov/media/152166/download  Fact Sheet for Healthcare Providers:  SeriousBroker.ithttps://www.fda.gov/media/152162/download  This test is no t yet approved or cleared by the [place] FDA and  has been authorized for detection and/or diagnosis of SARS-CoV-2 by FDA under an Emergency Use Authorization (EUA). This EUA will remain  in effect (meaning this test can be used) for the duration of the COVID-19 declaration under Section 564(b)(1) of the Act, 21 U.S.C.section 360bbb-3(b)(1), unless the authorization is terminated  or revoked sooner.       Influenza A by PCR NEGATIVE NEGATIVE Final   Influenza B by PCR NEGATIVE NEGATIVE Final    Comment: (NOTE) The Xpert Xpress SARS-CoV-2/FLU/RSV plus assay is intended as an aid in  the diagnosis of influenza from Nasopharyngeal swab specimens and should not be used as a sole basis for treatment. Nasal washings and aspirates  are unacceptable for Xpert Xpress SARS-CoV-2/FLU/RSV testing.  Fact Sheet for Patients: BloggerCourse.com  Fact Sheet for Healthcare Providers: SeriousBroker.it  This test is not yet approved or cleared by the [place] FDA and has been authorized for detection and/or diagnosis of SARS-CoV-2 by FDA under an Emergency Use Authorization (EUA). This EUA will remain in effect (meaning this test can be used) for the duration of the COVID-19 declaration under Section 564(b)(1) of the Act, 21 U.S.C. section 360bbb-3(b)(1), unless the authorization is terminated or revoked.  Performed at [facility], [address] [place], [place] [address]      Patient was seen and examined on the day of discharge and was found to be in stable condition. Time coordinating discharge: 40 minutes including assessment and coordination of care, as well as examination of the patient.   SIGNED:  Noralee Stain, DO [facility] 09/28/2021, 1:58 PM

## 2021-09-29 NOTE — Progress Notes (Unsigned)
NEUROLOGY FOLLOW UP OFFICE NOTE  GUISEPPE FLANAGAN [id number]  Assessment/Plan:   Numerous scattered small ischemic infarcts in the right MCA territory secondary to unknown embolic source Hypertension Hyperlipidemia  1  Continue 21 day DAPT followed by Plavix 75mg  daily monotherapy 2  Rosuvastatin 20mg  daily.  LDL goal less than 70 3  Normotensive blood pressure 4  Hgb A1c goal less than 7  Subjective:  JARONE OSTERGAARD is a 71 year old right-handed male with HTN, HLD and PUD with history of GI bleed and DVT whom I saw for headache presents today for recent stroke.  CT/CTA head and neck and MRI brain personally reviewed.  On 09/26/2021, he had episode of lightheadedness with left leg numbness lasting 30 minutes.  Several days prior, he reported brief episode of left arm incoordination.  CT head showed no acute abnormality but follow up MRI revealed numerous scattered small acute infarcts in the right MCA territory.  CTA of head and neck showed atherosclerotic changes at the carotid bifurcations but no large vessel occlusion or hemodynamically significant stenosis.  2D echo with bubble study showed EF 60-65% with no interatrial shunt.  LDL was 107 and Hgb A1c was 5.4.  He was on ASA 81mg  prior to admission and discharged on ASA 81mg  and Plavix 75mg  daily for 21 days followed by Plavix monotherapy.  Home rosuvastatin was increased from 10mg  to 20mg  daily.  He was discharged with plan for outpatient TEE and Loop recorder.  ***  PAST MEDICAL HISTORY: Past Medical History:  Diagnosis Date   Hyperlipidemia    Hypertension     MEDICATIONS: Current Outpatient Medications on File Prior to Visit  Medication Sig Dispense Refill   amLODipine (NORVASC) 5 MG tablet Take 1 tablet (5 mg total) by mouth daily for 30 days. (Patient taking differently: Take 5 mg by mouth every evening.) 30 tablet 0   aspirin EC 81 MG tablet Take 1 tablet (81 mg total) by mouth daily for 21 days. Swallow whole. 21  tablet 0   cholecalciferol (VITAMIN D3) 25 MCG (1000 UNIT) tablet Take 1,000 Units by mouth daily.     clopidogrel (PLAVIX) 75 MG tablet Take 1 tablet (75 mg total) by mouth daily. 30 tablet 2   famotidine-calcium carbonate-magnesium hydroxide (PEPCID COMPLETE) 10-800-165 MG chewable tablet Chew 1 tablet by mouth every evening.     Magnesium 500 MG TABS Take 1 tablet by mouth daily.     Multiple Vitamin (MULTI VITAMIN) TABS Take 1 tablet by mouth daily.     nortriptyline (PAMELOR) 10 MG capsule Take 2 capsules (20 mg total) by mouth at bedtime. (Patient taking differently: Take 10 mg by mouth at bedtime.) 60 capsule 5   rosuvastatin (CRESTOR) 20 MG tablet Take 1 tablet (20 mg total) by mouth daily. 30 tablet 2   Study - OCEANIC-STROKE - asundexian 50 mg or placebo tablet (PI-Sethi) Take 1 tablet (50 mg total) by mouth daily. For Investigational Use Only. Take at the same time each day (preferably in the morning). Tablet should be swallowed whole with water; it CANNOT be crushed or broken. Please contact [facility] for any questions or concerns regarding this medication. 98 tablet 0   triamcinolone cream (KENALOG) 0.1 % Apply 1 Application topically daily as needed (For dermatitis).     No current facility-administered medications on file prior to visit.    ALLERGIES: No Known Allergies  FAMILY HISTORY: No family history on file.    Objective:  *** General:  No acute distress.  Patient appears well-groomed.   Head:  Normocephalic/atraumatic Eyes:  Fundi examined but not visualized Neck: supple, no paraspinal tenderness, full range of motion Heart:  Regular rate and rhythm Lungs:  Clear to auscultation bilaterally Back: No paraspinal tenderness Neurological Exam: alert and oriented to person, place, and time.  Speech fluent and not dysarthric, language intact.  CN II-XII intact. Bulk and tone normal, muscle strength 5/5 throughout.  Sensation to light touch intact.  Deep  tendon reflexes 2+ throughout, toes downgoing.  Finger to nose testing intact.  Gait normal, Romberg negative.   Shon Millet, DO  CC: [name], MD

## 2021-09-29 NOTE — Progress Notes (Addendum)
    [facility] has been requested to perform a transesophageal echocardiogram on Ryan Franklin for stroke. Per chart review, patient has a past medical history of hypertensin, hyperlipidemia, chronic headache, carotid artery stenosis, and history of DVT in January 2022 in the setting of COVID infection. Patient presented to the ED on 6/18 complaining of an episode of lightheadedness and left leg numbness. Head CT was negative for acute intracranial abnormality and CT of the head and neck is negative for emergently large vessel occlusion or hemodynamically significant stenosis. MRI brain showed numerous small acute infarcts widely scattered throughout the right MCA territory. Patient underwent echocardiogram with bubble study on 6/19 that showed EF 60-65%, grade I diastolic dysfunction, normal RV systolic function, no significant valvular abnormalities, saline contrast bubble study was negative. Patient was discharged on 6/20 and told to come back for TEE on 6/22.   I contacted the patient on the phone- verified identity with name and DOB.   After careful review of history and examination, the risks and benefits of transesophageal echocardiogram have been explained including risks of esophageal damage, perforation (1:10,000 risk), bleeding, pharyngeal hematoma as well as other potential complications associated with conscious sedation including aspiration, arrhythmia, respiratory failure and death. Alternatives to treatment were discussed, questions were answered. Patient is willing to proceed.   Instructed patient to arrive at 6:15 AM and to not eat/drink anything after midnight. Patient voiced understanding. Also confirmed that he has someone to transport him to the hospital and to take him home after the procedure.   Jonita Albee, PA-C 09/29/2021 4:42 PM

## 2021-09-30 ENCOUNTER — Ambulatory Visit (HOSPITAL_COMMUNITY)
Admission: RE | Admit: 2021-09-30 | Discharge: 2021-09-30 | Disposition: A | Discharge status: Home / Self Care | Payer: Medicare HMO | Attending: Internal Medicine | Admitting: Internal Medicine

## 2021-09-30 ENCOUNTER — Ambulatory Visit (HOSPITAL_BASED_OUTPATIENT_CLINIC_OR_DEPARTMENT_OTHER): Payer: Medicare HMO | Admitting: Anesthesiology

## 2021-09-30 ENCOUNTER — Ambulatory Visit (HOSPITAL_COMMUNITY): Payer: Medicare HMO | Admitting: Anesthesiology

## 2021-09-30 ENCOUNTER — Other Ambulatory Visit: Payer: Self-pay

## 2021-09-30 ENCOUNTER — Ambulatory Visit (HOSPITAL_BASED_OUTPATIENT_CLINIC_OR_DEPARTMENT_OTHER): Payer: Medicare HMO

## 2021-09-30 ENCOUNTER — Encounter (HOSPITAL_COMMUNITY)
Admission: RE | Disposition: A | Discharge status: Home / Self Care | Payer: Medicare HMO | Source: Home / Self Care | Attending: Internal Medicine

## 2021-09-30 DIAGNOSIS — Z86718 Personal history of other venous thrombosis and embolism: Secondary | ICD-10-CM | POA: Diagnosis not present

## 2021-09-30 DIAGNOSIS — Z79899 Other long term (current) drug therapy: Secondary | ICD-10-CM | POA: Insufficient documentation

## 2021-09-30 DIAGNOSIS — I10 Essential (primary) hypertension: Secondary | ICD-10-CM

## 2021-09-30 DIAGNOSIS — I6521 Occlusion and stenosis of right carotid artery: Secondary | ICD-10-CM | POA: Diagnosis not present

## 2021-09-30 DIAGNOSIS — Z7982 Long term (current) use of aspirin: Secondary | ICD-10-CM | POA: Insufficient documentation

## 2021-09-30 DIAGNOSIS — I63511 Cerebral infarction due to unspecified occlusion or stenosis of right middle cerebral artery: Secondary | ICD-10-CM | POA: Diagnosis not present

## 2021-09-30 DIAGNOSIS — I639 Cerebral infarction, unspecified: Secondary | ICD-10-CM

## 2021-09-30 DIAGNOSIS — E785 Hyperlipidemia, unspecified: Secondary | ICD-10-CM | POA: Diagnosis not present

## 2021-09-30 DIAGNOSIS — Z8616 Personal history of COVID-19: Secondary | ICD-10-CM | POA: Insufficient documentation

## 2021-09-30 DIAGNOSIS — R519 Headache, unspecified: Secondary | ICD-10-CM | POA: Insufficient documentation

## 2021-09-30 DIAGNOSIS — I083 Combined rheumatic disorders of mitral, aortic and tricuspid valves: Secondary | ICD-10-CM | POA: Diagnosis not present

## 2021-09-30 HISTORY — PX: BUBBLE STUDY: SHX6837

## 2021-09-30 HISTORY — PX: TEE WITHOUT CARDIOVERSION: SHX5443

## 2021-09-30 SURGERY — ECHOCARDIOGRAM, TRANSESOPHAGEAL
Anesthesia: Monitor Anesthesia Care

## 2021-09-30 MED ORDER — PROPOFOL 10 MG/ML IV BOLUS
INTRAVENOUS | Status: DC | PRN
  Administered 2021-09-30: 20 mg via INTRAVENOUS
  Administered 2021-09-30 (×3): 10 mg via INTRAVENOUS

## 2021-09-30 MED ORDER — LIDOCAINE HCL (CARDIAC) PF 100 MG/5ML IV SOSY
PREFILLED_SYRINGE | INTRAVENOUS | Status: DC | PRN
  Administered 2021-09-30: 50 mg via INTRATRACHEAL

## 2021-09-30 MED ORDER — PROPOFOL 500 MG/50ML IV EMUL
INTRAVENOUS | Status: DC | PRN
  Administered 2021-09-30: 150 ug/kg/min via INTRAVENOUS

## 2021-09-30 MED ORDER — SODIUM CHLORIDE 0.9 % IV SOLN: INTRAVENOUS | Status: DC

## 2021-09-30 MED ORDER — LACTATED RINGERS IV SOLN: INTRAVENOUS | Status: DC

## 2021-09-30 NOTE — Transfer of Care (Signed)
Immediate Anesthesia Transfer of Care Note  Patient: Ryan Franklin  Procedure(s) Performed: TRANSESOPHAGEAL ECHOCARDIOGRAM (TEE) BUBBLE STUDY  Patient Location: PACU  Anesthesia Type:MAC  Level of Consciousness: awake, alert  and oriented  Airway & Oxygen Therapy: Patient Spontanous Breathing and Patient connected to nasal cannula oxygen  Post-op Assessment: Report given to RN and Post -op Vital signs reviewed and stable  Post vital signs: Reviewed and stable  Last Vitals:  Vitals Value Taken Time  BP 118/73 09/30/21 0804  Temp    Pulse 73 09/30/21 0805  Resp 16 09/30/21 0805  SpO2 98 % 09/30/21 0805  Vitals shown include unvalidated device data.  Last Pain:  Vitals:   09/30/21 0706  TempSrc: Temporal  PainSc: 0-No pain         Complications: No notable events documented.

## 2021-09-30 NOTE — Anesthesia Preprocedure Evaluation (Signed)
Anesthesia Evaluation  Patient identified by MRN, date of birth, ID band Patient awake    Reviewed: Allergy & Precautions, NPO status , Patient's Chart, lab work & pertinent test results  Airway Mallampati: II  TM Distance: >3 FB Neck ROM: Full    Dental no notable dental hx.    Pulmonary neg pulmonary ROS,    Pulmonary exam normal        Cardiovascular hypertension, Pt. on medications  Rhythm:Regular Rate:Normal     Neuro/Psych  Headaches, CVA, No Residual Symptoms negative psych ROS   GI/Hepatic Neg liver ROS, GERD  ,  Endo/Other  negative endocrine ROS  Renal/GU negative Renal ROS  negative genitourinary   Musculoskeletal negative musculoskeletal ROS (+)   Abdominal Normal abdominal exam  (+)   Peds  Hematology negative hematology ROS (+)   Anesthesia Other Findings   Reproductive/Obstetrics                             Anesthesia Physical Anesthesia Plan  ASA: 3  Anesthesia Plan: MAC   Post-op Pain Management:    Induction: Intravenous  PONV Risk Score and Plan: 1 and Propofol infusion and Treatment may vary due to age or medical condition  Airway Management Planned: Simple Face Mask, Natural Airway and Nasal Cannula  Additional Equipment: None  Intra-op Plan:   Post-operative Plan:   Informed Consent: I have reviewed the patients History and Physical, chart, labs and discussed the procedure including the risks, benefits and alternatives for the proposed anesthesia with the patient or authorized representative who has indicated his/her understanding and acceptance.     Dental advisory given  Plan Discussed with: CRNA  Anesthesia Plan Comments:         Anesthesia Quick Evaluation

## 2021-09-30 NOTE — CV Procedure (Signed)
TRANSESOPHAGEAL ECHOCARDIOGRAM (TEE) NOTE  INDICATIONS: Cryptogenic stroke  PROCEDURE:   Informed consent was obtained prior to the procedure. The risks, benefits and alternatives for the procedure were discussed and the patient comprehended these risks.  Risks include, but are not limited to, cough, sore throat, vomiting, nausea, somnolence, esophageal and stomach trauma or perforation, bleeding, low blood pressure, aspiration, pneumonia, infection, trauma to the teeth and death.    After a procedural time-out, the patient was given propofol for sedation by anesthesia. See their separate report.  The patient's heart rate, blood pressure, and oxygen saturation are monitored continuously during the procedure.The oropharynx was anesthetized with topical cetacaine.  The transesophageal probe was inserted in the esophagus and stomach without difficulty and multiple views were obtained.  The patient was kept under observation until the patient left the procedure room.  I was present face-to-face 100% of this time. The patient left the procedure room in stable condition.   Agitated microbubble saline contrast was administered.  COMPLICATIONS:    There were no immediate complications.  Findings:  LEFT VENTRICLE: The left ventricular wall thickness is normal.  The left ventricular cavity is normal in size. Wall motion is normal.  LVEF is 60-65%.  RIGHT VENTRICLE:  The right ventricle is normal in structure and function without any thrombus or masses.    LEFT ATRIUM:  The left atrium is normal in size without any thrombus or masses.  There is not spontaneous echo contrast ("smoke") in the left atrium consistent with a low flow state.  LEFT ATRIAL APPENDAGE:  The left atrial appendage is free of any thrombus or masses. The appendage has single lobes. Pulse doppler indicates moderate flow in the appendage.  ATRIAL SEPTUM:  The atrial septum appears intact and is free of thrombus and/or masses.   There is no evidence for interatrial shunting by color doppler and saline microbubble.  RIGHT ATRIUM:  The right atrium is normal in size and function without any thrombus or masses.  MITRAL VALVE:  The mitral valve is normal in structure and function with  trivial  regurgitation.  There were no vegetations or stenosis.  AORTIC VALVE:  The aortic valve is trileaflet, There is thickening and calcification of the non-coronary cusp with restricted motion and associated  trivial central  regurgitation.  There were no vegetations or stenosis.  TRICUSPID VALVE:  The tricuspid valve is normal in structure and function with  trivial  regurgitation.  There were no vegetations or stenosis   PULMONIC VALVE:  The pulmonic valve is normal in structure and function with  trivial  regurgitation.  There were no vegetations or stenosis.   AORTIC ARCH, ASCENDING AND DESCENDING AORTA:  There was no Myrtis Ser et. Al, 1992) atherosclerosis of the ascending aorta, aortic arch, or proximal descending aorta. The proximal ascending aorta is borderline dilated at 39 mm.  12. PULMONARY VEINS: Anomalous pulmonary venous return was not noted.  13. PERICARDIUM: The pericardium appeared normal and non-thickened.  There is no pericardial effusion.  IMPRESSION:   No LAA thrombus Negative for PFO by color/bubble Trivial AI - tip of the non-coronary aortic valve leaflet is thickened and calcified Borderline dilated ascending aorta to 39 mm Normal biatrial size LVEF 60-65%  RECOMMENDATIONS:    No obvious culprit for stroke. There is abnormal thickening and calcification of the non-coronary cusp aortic valve leaflet, which could represent sequelae of resolved endocarditis. The leaflet is calcified, however, it could be possible that material embolized from the valve.  No  high risk features at this point and nothing further to do about it.  Time Spent Directly with the Patient:  45 minutes   Chrystie Nose, MD, [facility],  FACP  [place]  [facility]  Medical Director of the [facility] Lipid [facility]  Cardiovascular Risk Reduction Clinic Diplomate of the American Board of Clinical Lipidology Attending Cardiologist  Direct Dial: [phone]  Fax: [phone]  Website:  [email or web address] Nicely Alexianna Nachreiner 09/30/2021, 8:05 AM

## 2021-09-30 NOTE — Interval H&P Note (Signed)
History and Physical Interval Note:  09/30/2021 7:36 AM  Ryan Franklin  has presented today for surgery, with the diagnosis of STROKE.  The various methods of treatment have been discussed with the patient and family. After consideration of risks, benefits and other options for treatment, the patient has consented to  Procedure(s): TRANSESOPHAGEAL ECHOCARDIOGRAM (TEE) (N/A) as a surgical intervention.  The patient's history has been reviewed, patient examined, no change in status, stable for surgery.  I have reviewed the patient's chart and labs.  Questions were answered to the patient's satisfaction.     Chrystie Nose

## 2021-09-30 NOTE — Progress Notes (Signed)
\   Echocardiogram Echocardiogram Transesophageal has been performed.  Ryan Franklin 09/30/2021, 8:07 AM

## 2021-09-30 NOTE — Anesthesia Procedure Notes (Signed)
Procedure Name: MAC Date/Time: 09/30/2021 7:41 AM  Performed by: Mariea Clonts, CRNAPre-anesthesia Checklist: Patient identified, Emergency Drugs available, Suction available, Patient being monitored and Timeout performed Patient Re-evaluated:Patient Re-evaluated prior to induction Oxygen Delivery Method: Simple face mask and Nasal cannula

## 2021-10-01 ENCOUNTER — Ambulatory Visit: Payer: Medicare HMO | Admitting: Neurology

## 2021-10-01 ENCOUNTER — Encounter: Payer: Self-pay | Admitting: Neurology

## 2021-10-01 VITALS — BP 147/87 | HR 67 | Ht 70.0 in | Wt 198.4 lb

## 2021-10-01 DIAGNOSIS — E785 Hyperlipidemia, unspecified: Secondary | ICD-10-CM

## 2021-10-01 DIAGNOSIS — I6310 Cerebral infarction due to embolism of unspecified precerebral artery: Secondary | ICD-10-CM | POA: Diagnosis not present

## 2021-10-01 DIAGNOSIS — I10 Essential (primary) hypertension: Secondary | ICD-10-CM

## 2021-10-01 DIAGNOSIS — R519 Headache, unspecified: Secondary | ICD-10-CM | POA: Diagnosis not present

## 2021-10-06 NOTE — Progress Notes (Unsigned)
Cardiology Office Note:   Date:  10/07/2021  NAME:  Ryan Franklin    MRN: [id number] DOB:  03-15-1951   PCP:  [name], MD  Cardiologist:  Reatha Harps, MD  Electrophysiologist:  None   Referring MD: [name], MD   Chief Complaint  Patient presents with   Cerebrovascular Accident    History of Present Illness:   Ryan Franklin is a 71 y.o. male with a hx of HTN, CVA, HLD who is being seen today for the evaluation of stroke at the request of [name], MD. recently admitted for right MCA stroke.  Has known carotid artery disease in the right carotid.  Up to 50% on CTA.  It appears neurology felt this was inconsistent with the diagnosis of stroke.  They have requested monitor as well as possible loop recorder.  He reports he is recovered from his stroke.  No chest pain or trouble breathing.  He has never had a heart attack.  EKG demonstrates sinus rhythm with no acute ischemic changes.  He reports no chest pain or trouble breathing.  Currently on aspirin Plavix.  Crestor was increased.  We discussed getting his LDL to acceptable goal.  Value should be less than 70.  Blood pressure is well controlled.  Overall seems to be doing better.  Not diabetic.  I am a bit concerned about the right ICA stenosis.  Problem List R MCA Stroke (09/2021) R carotid artery disease -50-69% stenosis  3. HLD -T chol 184, HDL 49, LDL 107, TG 138 -A1c 5.4 4. HTN  Past Medical History: Past Medical History:  Diagnosis Date   Hyperlipidemia    Hypertension    Stroke [facility])     Past Surgical History: Past Surgical History:  Procedure Laterality Date   BUBBLE STUDY  09/30/2021   Procedure: BUBBLE STUDY;  Surgeon: Chrystie Nose, MD;  Location: [facility] ENDOSCOPY;  Service: Cardiovascular;;   HERNIA REPAIR     TEE WITHOUT CARDIOVERSION N/A 09/30/2021   Procedure: TRANSESOPHAGEAL ECHOCARDIOGRAM (TEE);  Surgeon: Chrystie Nose, MD;  Location: [facility] ENDOSCOPY;  Service: Cardiovascular;   Laterality: N/A;    Current Medications: Current Meds  Medication Sig   amLODipine (NORVASC) 5 MG tablet Take 1 tablet (5 mg total) by mouth daily for 30 days. (Patient taking differently: Take 5 mg by mouth every evening.)   aspirin EC 81 MG tablet Take 1 tablet (81 mg total) by mouth daily for 21 days. Swallow whole.   cholecalciferol (VITAMIN D3) 25 MCG (1000 UNIT) tablet Take 1,000 Units by mouth daily.   clopidogrel (PLAVIX) 75 MG tablet Take 1 tablet (75 mg total) by mouth daily.   famotidine-calcium carbonate-magnesium hydroxide (PEPCID COMPLETE) 10-800-165 MG chewable tablet Chew 1 tablet by mouth every evening.   Magnesium 500 MG TABS Take 1 tablet by mouth daily.   Multiple Vitamin (MULTI VITAMIN) TABS Take 1 tablet by mouth daily.   nortriptyline (PAMELOR) 10 MG capsule Take 2 capsules (20 mg total) by mouth at bedtime. (Patient taking differently: Take 10 mg by mouth at bedtime.)   rosuvastatin (CRESTOR) 20 MG tablet Take 1 tablet (20 mg total) by mouth daily.   Study - OCEANIC-STROKE - asundexian 50 mg or placebo tablet (PI-Sethi) Take 1 tablet (50 mg total) by mouth daily. For Investigational Use Only. Take at the same time each day (preferably in the morning). Tablet should be swallowed whole with water; it CANNOT be crushed or broken. Please contact [facility] for  any questions or concerns regarding this medication.   triamcinolone cream (KENALOG) 0.1 % Apply 1 Application topically daily as needed (For dermatitis).     Allergies:    Patient has no known allergies.   Social History: Social History   Socioeconomic History   Marital status: Single    Spouse name: Not on file   Number of children: Not on file   Years of education: Not on file   Highest education level: Not on file  Occupational History   Occupation: works for himself   Occupation: Science writer  Tobacco Use   Smoking status: Never   Smokeless tobacco: Never  Vaping Use    Vaping Use: Never used  Substance and Sexual Activity   Alcohol use: Yes    Comment: occ   Drug use: Never   Sexual activity: Not on file  Other Topics Concern   Not on file  Social History Narrative   Right handed   Lives alone two story home   Drinks caffeine   Social Determinants of Health   Financial Resource Strain: Not on file  Food Insecurity: Not on file  Transportation Needs: Not on file  Physical Activity: Not on file  Stress: Not on file  Social Connections: Not on file     Family History: The patient's family history includes Heart attack in his mother; Heart disease in his father and mother; Stroke in his father.  ROS:   All other ROS reviewed and negative. Pertinent positives noted in the HPI.     EKGs/Labs/Other Studies Reviewed:   The following studies were personally reviewed by me today:  EKG:  EKG is ordered today.  The ekg ordered today demonstrates normal sinus rhythm heart rate 63, old anteroseptal infarct, and was personally reviewed by me.   TEE 09/30/2021  1. Left ventricular ejection fraction, by estimation, is 60 to 65%. The  left ventricle has normal function.   2. Right ventricular systolic function is normal. The right ventricular  size is normal.   3. No left atrial/left atrial appendage thrombus was detected.   4. The mitral valve is grossly normal. Trivial mitral valve  regurgitation.   5. There is calcification of the tip of the non-coronary cusp with mild  leaflet restriction. The aortic valve is tricuspid. There is mild  calcification of the aortic valve. Aortic valve regurgitation is trivial.   6. Aortic dilatation noted. There is borderline dilatation of the  ascending aorta, measuring 39 mm.   7. Agitated saline contrast bubble study was negative, with no evidence  of any interatrial shunt.  Recent Labs: 09/26/2021: ALT 20 09/27/2021: BUN 19; Creatinine, Ser 1.00; Hemoglobin 16.3; Platelets 230; Potassium 3.7; Sodium 137    Recent Lipid Panel    Component Value Date/Time   CHOL 184 09/27/2021 0355   TRIG 138 09/27/2021 0355   HDL 49 09/27/2021 0355   CHOLHDL 3.8 09/27/2021 0355   VLDL 28 09/27/2021 0355   LDLCALC 107 (H) 09/27/2021 0355    Physical Exam:   VS:  BP 128/80   Pulse 63   Ht 5\' 10"  (1.778 m)   Wt 199 lb 9.6 oz (90.5 kg)   SpO2 96%   BMI 28.64 kg/m    Wt Readings from Last 3 Encounters:  10/07/21 199 lb 9.6 oz (90.5 kg)  10/01/21 198 lb 6.4 oz (90 kg)  09/30/21 190 lb (86.2 kg)    General: Well nourished, well developed, in no acute distress Head: Atraumatic, normal size  Eyes: PEERLA, EOMI  Neck: Supple, no JVD Endocrine: No thryomegaly Cardiac: Normal S1, S2; RRR; no murmurs, rubs, or gallops Lungs: Clear to auscultation bilaterally, no wheezing, rhonchi or rales  Abd: Soft, nontender, no hepatomegaly  Ext: No edema, pulses 2+ Musculoskeletal: No deformities, BUE and BLE strength normal and equal Skin: Warm and dry, no rashes   Neuro: Alert and oriented to person, place, time, and situation, CNII-XII grossly intact, no focal deficits  Psych: Normal mood and affect   ASSESSMENT:   JEFTE CARITHERS is a 71 y.o. male who presents for the following: 1. Cerebrovascular accident (CVA) due to embolism of right middle cerebral artery (HCC)   2. Carotid stenosis, right   3. Hyperlipidemia, unspecified hyperlipidemia type   4. Essential hypertension     PLAN:   1. Cerebrovascular accident (CVA) due to embolism of right middle cerebral artery (HCC) 2. Carotid stenosis, right 3. Hyperlipidemia, unspecified hyperlipidemia type -Recent right MCA stroke.  Has 50 to 69% stenosis in the right ICA.  CTA confirms a 50% stenosis.  It appears neurology does not believe this is the culprit.  He underwent a transesophageal echo without evidence of PFO.  No A-fib has been captured yet.  We will start with a 30-day event monitor.  I would like to reach out to neurology to get their thoughts on  the right ICA stenosis.  Also will discuss his case with vascular surgery.  I wonder if he would benefit from carotid endarterectomy.  We will hold on loop recorder for now.  We will start with the above work-up.  He will see me back as needed based on the results of the monitor.  If he needs to see me back for loop recorder we will arrange this.  In the meantime he will continue aspirin and Plavix per neurology.  Crestor was increased.  Goal LDL less than 70.  Not diabetic.  Blood pressure is well controlled.  4. Essential hypertension -Well-controlled.  No change in medications.  Disposition: Return if symptoms worsen or fail to improve.  Medication Adjustments/Labs and Tests Ordered: Current medicines are reviewed at length with the patient today.  Concerns regarding medicines are outlined above.  Orders Placed This Encounter  Procedures   CARDIAC EVENT MONITOR   EKG 12-Lead   No orders of the defined types were placed in this encounter.   Patient Instructions  Medication Instructions:  Your Physician recommend you continue on your current medication as directed.    *If you need a refill on your cardiac medications before your next appointment, please call your pharmacy*   Lab Work: None ordered today   Testing/Procedures: Your physician has recommended that you wear a 30 day event monitor. Event monitors are medical devices that record the heart's electrical activity. Doctors most often [place] these monitors to diagnose arrhythmias. Arrhythmias are problems with the speed or rhythm of the heartbeat. The monitor is a small, portable device. You can wear one while you do your normal daily activities. This is usually used to diagnose what is causing palpitations/syncope (passing out).    Follow-Up: At [facility], you and your health needs are our priority.  As part of our continuing mission to provide you with exceptional heart care, we have created designated Provider Care Teams.   These Care Teams include your primary Cardiologist (physician) and Advanced Practice Providers (APPs -  Physician Assistants and Nurse Practitioners) who all work together to provide you with the care you need, when you  need it.  We recommend signing up for the patient portal called "MyChart".  Sign up information is provided on this After Visit Summary.  MyChart is used to connect with patients for Virtual Visits (Telemedicine).  Patients are able to view lab/test results, encounter notes, upcoming appointments, etc.  Non-urgent messages can be sent to your provider as well.   To learn more about what you can do with MyChart, go to [email or web address].    Your next appointment:   As needed  The format for your next appointment:   In Person  Provider:   Reatha Harps, MD {            Signed, Lenna Gilford. Flora Lipps, MD, [facility]  [facility]  [address] [place], [place] [address] [phone]  10/07/2021 12:11 PM

## 2021-10-07 ENCOUNTER — Ambulatory Visit: Payer: Medicare HMO | Admitting: Cardiovascular Disease

## 2021-10-07 ENCOUNTER — Other Ambulatory Visit: Payer: Self-pay | Admitting: Cardiovascular Disease

## 2021-10-07 ENCOUNTER — Encounter: Payer: Self-pay | Admitting: Cardiovascular Disease

## 2021-10-07 ENCOUNTER — Telehealth: Payer: Self-pay

## 2021-10-07 VITALS — BP 128/80 | HR 63 | Ht 70.0 in | Wt 199.6 lb

## 2021-10-07 DIAGNOSIS — I6521 Occlusion and stenosis of right carotid artery: Secondary | ICD-10-CM

## 2021-10-07 DIAGNOSIS — E785 Hyperlipidemia, unspecified: Secondary | ICD-10-CM

## 2021-10-07 DIAGNOSIS — I63411 Cerebral infarction due to embolism of right middle cerebral artery: Secondary | ICD-10-CM

## 2021-10-07 DIAGNOSIS — I639 Cerebral infarction, unspecified: Secondary | ICD-10-CM

## 2021-10-07 DIAGNOSIS — I10 Essential (primary) hypertension: Secondary | ICD-10-CM

## 2021-10-07 DIAGNOSIS — I4891 Unspecified atrial fibrillation: Secondary | ICD-10-CM

## 2021-10-07 NOTE — Telephone Encounter (Signed)
-  Called to inform pt MD would like to order a carotid ultrasound -Left message to call back

## 2021-10-07 NOTE — Patient Instructions (Signed)
Medication Instructions:  Your Physician recommend you continue on your current medication as directed.    *If you need a refill on your cardiac medications before your next appointment, please call your pharmacy*   Lab Work: None ordered today   Testing/Procedures: Your physician has recommended that you wear a 30 day event monitor. Event monitors are medical devices that record the heart's electrical activity. Doctors most often [place] these monitors to diagnose arrhythmias. Arrhythmias are problems with the speed or rhythm of the heartbeat. The monitor is a small, portable device. You can wear one while you do your normal daily activities. This is usually used to diagnose what is causing palpitations/syncope (passing out).    Follow-Up: At [facility], you and your health needs are our priority.  As part of our continuing mission to provide you with exceptional heart care, we have created designated Provider Care Teams.  These Care Teams include your primary Cardiologist (physician) and Advanced Practice Providers (APPs -  Physician Assistants and Nurse Practitioners) who all work together to provide you with the care you need, when you need it.  We recommend signing up for the patient portal called "MyChart".  Sign up information is provided on this After Visit Summary.  MyChart is used to connect with patients for Virtual Visits (Telemedicine).  Patients are able to view lab/test results, encounter notes, upcoming appointments, etc.  Non-urgent messages can be sent to your provider as well.   To learn more about what you can do with MyChart, go to [email or web address].    Your next appointment:   As needed  The format for your next appointment:   In Person  Provider:   Reatha Harps, MD {

## 2021-10-08 NOTE — Telephone Encounter (Signed)
-  Pt informed of below message and agreeable to plan -Pt also made aware MD recommends to hold off on monitor for now  -Pt verbalized understanding  Referral to VVS placed  I discussed his care with Dr. Chestine Spore. They will arrange for a carotid ultrasound at their office and see him the same day to determine his need for surgery.   -W   Gerri Spore T. Flora Lipps, MD, [facility]  [facility]  [address]  [place], [place] [address]  [phone]  5:46 PM

## 2021-10-13 DIAGNOSIS — E7800 Pure hypercholesterolemia, unspecified: Secondary | ICD-10-CM | POA: Diagnosis not present

## 2021-10-13 DIAGNOSIS — I679 Cerebrovascular disease, unspecified: Secondary | ICD-10-CM | POA: Diagnosis not present

## 2021-10-13 DIAGNOSIS — I10 Essential (primary) hypertension: Secondary | ICD-10-CM | POA: Diagnosis not present

## 2021-10-13 DIAGNOSIS — I6521 Occlusion and stenosis of right carotid artery: Secondary | ICD-10-CM | POA: Diagnosis not present

## 2021-10-25 ENCOUNTER — Other Ambulatory Visit: Payer: Self-pay | Admitting: *Deleted

## 2021-10-25 DIAGNOSIS — I6529 Occlusion and stenosis of unspecified carotid artery: Secondary | ICD-10-CM

## 2021-11-05 DIAGNOSIS — H47531 Disorders of visual pathways in (due to) vascular disorders, right side: Secondary | ICD-10-CM | POA: Diagnosis not present

## 2021-11-05 DIAGNOSIS — H35371 Puckering of macula, right eye: Secondary | ICD-10-CM | POA: Diagnosis not present

## 2021-11-11 ENCOUNTER — Encounter: Payer: Self-pay | Admitting: Cardiovascular Disease

## 2021-11-11 DIAGNOSIS — H53451 Other localized visual field defect, right eye: Secondary | ICD-10-CM | POA: Diagnosis not present

## 2021-11-16 ENCOUNTER — Ambulatory Visit (HOSPITAL_COMMUNITY)
Admission: RE | Admit: 2021-11-16 | Discharge: 2021-11-16 | Disposition: A | Discharge status: Home / Self Care | Payer: Medicare HMO | Source: Ambulatory Visit | Attending: Vascular Surgery | Admitting: Vascular Surgery

## 2021-11-16 ENCOUNTER — Encounter: Payer: Self-pay | Admitting: Vascular Surgery

## 2021-11-16 ENCOUNTER — Ambulatory Visit: Payer: Medicare HMO | Admitting: Vascular Surgery

## 2021-11-16 ENCOUNTER — Other Ambulatory Visit: Payer: Self-pay

## 2021-11-16 VITALS — BP 149/79 | HR 65 | Temp 98.0°F | Resp 16 | Ht 70.0 in | Wt 203.0 lb

## 2021-11-16 DIAGNOSIS — I6521 Occlusion and stenosis of right carotid artery: Secondary | ICD-10-CM

## 2021-11-16 DIAGNOSIS — I6529 Occlusion and stenosis of unspecified carotid artery: Secondary | ICD-10-CM | POA: Diagnosis not present

## 2021-11-16 DIAGNOSIS — I63511 Cerebral infarction due to unspecified occlusion or stenosis of right middle cerebral artery: Secondary | ICD-10-CM

## 2021-11-16 NOTE — Progress Notes (Signed)
Patient name: Ryan Franklin MRN: [id number] DOB: Jul 18, 1950 Sex: male  REASON FOR CONSULT: Evaluate for symptomatic carotid stenosis on the right  HPI: Ryan Franklin is a 71 y.o. male, with history of hypertension and hyperlipidemia that presents for evaluation of a symptomatic right carotid stenosis.  Patient was seen last year by Dr. Darrick Penna with a right retinal embolus.  Medical management was recommended for his carotid as the stenosis was about 50%.  Most recently this year he developed left leg numbness and presented with evidence of right MCA distribution stroke.  He was evaluated by neurology who thought it was embolic and ultimately sent him to see Dr. Bufford Buttner with cardiology.  He has no evidence of atrial fibrillation and Dr. Bufford Buttner contacted vascular to further evaluate.  Patient states he is back to his neurologic baseline.  No history of neck surgery or neck radiation.  Remains on plavix and statin.    Past Medical History:  Diagnosis Date   Hyperlipidemia    Hypertension    Stroke [facility])     Past Surgical History:  Procedure Laterality Date   BUBBLE STUDY  09/30/2021   Procedure: BUBBLE STUDY;  Surgeon: Chrystie Nose, MD;  Location: [facility] ENDOSCOPY;  Service: Cardiovascular;;   HERNIA REPAIR     TEE WITHOUT CARDIOVERSION N/A 09/30/2021   Procedure: TRANSESOPHAGEAL ECHOCARDIOGRAM (TEE);  Surgeon: Chrystie Nose, MD;  Location: [facility] ENDOSCOPY;  Service: Cardiovascular;  Laterality: N/A;    Family History  Problem Relation Age of Onset   Heart attack Mother    Heart disease Mother    Heart disease Father    Stroke Father     SOCIAL HISTORY: Social History   Socioeconomic History   Marital status: Single    Spouse name: Not on file   Number of children: Not on file   Years of education: Not on file   Highest education level: Not on file  Occupational History   Occupation: works for himself   Occupation: Science writer  Tobacco Use   Smoking status:  Never   Smokeless tobacco: Never  Vaping Use   Vaping Use: Never used  Substance and Sexual Activity   Alcohol use: Yes    Comment: occ   Drug use: Never   Sexual activity: Not on file  Other Topics Concern   Not on file  Social History Narrative   Right handed   Lives alone two story home   Drinks caffeine   Social Determinants of Health   Financial Resource Strain: Not on file  Food Insecurity: Not on file  Transportation Needs: Not on file  Physical Activity: Not on file  Stress: Not on file  Social Connections: Not on file  Intimate Partner Violence: Not on file    No Known Allergies  Current Outpatient Medications  Medication Sig Dispense Refill   cholecalciferol (VITAMIN D3) 25 MCG (1000 UNIT) tablet Take 1,000 Units by mouth daily.     clopidogrel (PLAVIX) 75 MG tablet Take 1 tablet (75 mg total) by mouth daily. 30 tablet 2   famotidine-calcium carbonate-magnesium hydroxide (PEPCID COMPLETE) 10-800-165 MG chewable tablet Chew 1 tablet by mouth every evening.     Magnesium 500 MG TABS Take 1 tablet by mouth daily.     Multiple Vitamin (MULTI VITAMIN) TABS Take 1 tablet by mouth daily.     nortriptyline (PAMELOR) 10 MG capsule Take 2 capsules (20 mg total) by mouth at bedtime. (Patient taking differently: Take 10 mg by  mouth at bedtime.) 60 capsule 5   rosuvastatin (CRESTOR) 20 MG tablet Take 1 tablet (20 mg total) by mouth daily. 30 tablet 2   Study - OCEANIC-STROKE - asundexian 50 mg or placebo tablet (PI-Sethi) Take 1 tablet (50 mg total) by mouth daily. For Investigational Use Only. Take at the same time each day (preferably in the morning). Tablet should be swallowed whole with water; it CANNOT be crushed or broken. Please contact [facility] for any questions or concerns regarding this medication. 98 tablet 0   triamcinolone cream (KENALOG) 0.1 % Apply 1 Application topically daily as needed (For dermatitis).     amLODipine (NORVASC) 5 MG tablet Take  1 tablet (5 mg total) by mouth daily for 30 days. (Patient taking differently: Take 5 mg by mouth every evening.) 30 tablet 0   No current facility-administered medications for this visit.    REVIEW OF SYSTEMS:  [X]  denotes positive finding, [ ]  denotes negative finding Cardiac  Comments:  Chest pain or chest pressure:    Shortness of breath upon exertion:    Short of breath when lying flat:    Irregular heart rhythm:        Vascular    Pain in calf, thigh, or hip brought on by ambulation:    Pain in feet at night that wakes you up from your sleep:     Blood clot in your veins:    Leg swelling:         Pulmonary    Oxygen at home:    Productive cough:     Wheezing:         Neurologic    Sudden weakness in arms or legs:     Sudden numbness in arms or legs:     Sudden onset of difficulty speaking or slurred speech:    Temporary loss of vision in one eye:     Problems with dizziness:         Gastrointestinal    Blood in stool:     Vomited blood:         Genitourinary    Burning when urinating:     Blood in urine:        Psychiatric    Major depression:         Hematologic    Bleeding problems:    Problems with blood clotting too easily:        Skin    Rashes or ulcers:        Constitutional    Fever or chills:      PHYSICAL EXAM: Vitals:   11/16/21 0932 11/16/21 0933  BP: (!) 148/84 (!) 149/79  Pulse: 68 65  Resp: 16   Temp: 98 F (36.7 C)   TempSrc: Temporal   SpO2: 97%   Weight: 203 lb (92.1 kg)   Height: 5\' 10"  (1.778 m)     GENERAL: The patient is a well-nourished male, in no acute distress. The vital signs are documented above. CARDIAC: There is a regular rate and rhythm.  VASCULAR:  No previous neck incisions PULMONARY: No respiratory distress. ABDOMEN: Soft and non-tender. MUSCULOSKELETAL: There are no major deformities or cyanosis. NEUROLOGIC: No focal weakness or paresthesias are detected.  Cranial nerves II through XII grossly  intact. SKIN: There are no ulcers or rashes noted. PSYCHIATRIC: The patient has a normal affect.  DATA:   CTA reviewed from 09/27/2021 with ulcerated right ICA plaque >50%  MRI 09/27/2021 with numerous small infarcts throughout  the right MCA territory  Assessment/Plan:  70 year old male presents for evaluation of symptomatic right ICA stenosis.  I reviewed his MRI on 09/27/2021 that shows numerous small infarcts scattered throughout the right MCA territory.  His CTA neck on 09/26/2021 by my review shows an ulcerated plaque in the proximal ICA over 50% and closer to 60%.  He has had no evidence of atrial fibrillation after evaluation by cardiology.  This is actually a recurrent event given he had a right retinal embolus last year and was evaluated by Dr. Darrick Penna.  Given the recurrent nature of this I think he would benefit from carotid intervention and also discussed that I think this is over 50% which guidelines would support intervention.  I discussed carotid endarterectomy but the patient has done research and would prefer a carotid stent with flow reversal using TCAR.  I will get him scheduled for Monday in the operating room.  Discussed he continue aspirin Plavix statin.  Risk benefits discussed including 1% perioperative stroke risk.  Discussed intervention is for stroke risk reduction.   Cephus Shelling, MD [facility] Office: [phone]

## 2021-11-19 ENCOUNTER — Other Ambulatory Visit: Payer: Self-pay

## 2021-11-19 ENCOUNTER — Encounter (HOSPITAL_COMMUNITY): Payer: Self-pay

## 2021-11-19 ENCOUNTER — Encounter (HOSPITAL_COMMUNITY)
Admission: RE | Admit: 2021-11-19 | Discharge: 2021-11-19 | Disposition: A | Discharge status: Home / Self Care | Payer: Medicare HMO | Source: Ambulatory Visit | Attending: Vascular Surgery | Admitting: Vascular Surgery

## 2021-11-19 VITALS — BP 129/82 | HR 66 | Temp 97.9°F | Resp 17 | Ht 70.0 in | Wt 205.9 lb

## 2021-11-19 DIAGNOSIS — I6521 Occlusion and stenosis of right carotid artery: Secondary | ICD-10-CM | POA: Insufficient documentation

## 2021-11-19 DIAGNOSIS — Z01818 Encounter for other preprocedural examination: Secondary | ICD-10-CM | POA: Insufficient documentation

## 2021-11-19 HISTORY — DX: Acute embolism and thrombosis of unspecified deep veins of unspecified lower extremity: I82.409

## 2021-11-19 HISTORY — DX: Unspecified osteoarthritis, unspecified site: M19.90

## 2021-11-19 HISTORY — DX: Peripheral vascular disease, unspecified: I73.9

## 2021-11-19 LAB — URINALYSIS, ROUTINE W REFLEX MICROSCOPIC
Bilirubin Urine: NEGATIVE
Glucose, UA: NEGATIVE mg/dL
Hgb urine dipstick: NEGATIVE
Ketones, ur: NEGATIVE mg/dL
Leukocytes,Ua: NEGATIVE
Nitrite: NEGATIVE
Protein, ur: NEGATIVE mg/dL
Specific Gravity, Urine: 1.003 — ABNORMAL LOW (ref 1.005–1.030)
pH: 7 (ref 5.0–8.0)

## 2021-11-19 LAB — CBC
HCT: 49.7 % (ref 39.0–52.0)
Hemoglobin: 16.6 g/dL (ref 13.0–17.0)
MCH: 29.2 pg (ref 26.0–34.0)
MCHC: 33.4 g/dL (ref 30.0–36.0)
MCV: 87.5 fL (ref 80.0–100.0)
Platelets: 221 10*3/uL (ref 150–400)
RBC: 5.68 MIL/uL (ref 4.22–5.81)
RDW: 15.9 % — ABNORMAL HIGH (ref 11.5–15.5)
WBC: 5 10*3/uL (ref 4.0–10.5)
nRBC: 0 % (ref 0.0–0.2)

## 2021-11-19 LAB — TYPE AND SCREEN
ABO/RH(D): O NEG
Antibody Screen: NEGATIVE

## 2021-11-19 LAB — COMPREHENSIVE METABOLIC PANEL
ALT: 27 U/L (ref 0–44)
AST: 27 U/L (ref 15–41)
Albumin: 4.1 g/dL (ref 3.5–5.0)
Alkaline Phosphatase: 59 U/L (ref 38–126)
Anion gap: 8 (ref 5–15)
BUN: 21 mg/dL (ref 8–23)
CO2: 27 mmol/L (ref 22–32)
Calcium: 9.4 mg/dL (ref 8.9–10.3)
Chloride: 105 mmol/L (ref 98–111)
Creatinine, Ser: 1.15 mg/dL (ref 0.61–1.24)
GFR, Estimated: >60 mL/min (ref >60)
Glucose, Bld: 91 mg/dL (ref 70–99)
Potassium: 4.2 mmol/L (ref 3.5–5.1)
Sodium: 140 mmol/L (ref 135–145)
Total Bilirubin: 0.6 mg/dL (ref 0.3–1.2)
Total Protein: 7.2 g/dL (ref 6.5–8.1)

## 2021-11-19 LAB — PROTIME-INR
INR: 1 (ref 0.8–1.2)
Prothrombin Time: 12.8 seconds (ref 11.4–15.2)

## 2021-11-19 LAB — SURGICAL PCR SCREEN
MRSA, PCR: NEGATIVE
Staphylococcus aureus: POSITIVE — AB

## 2021-11-19 LAB — APTT: aPTT: 28 seconds (ref 24–36)

## 2021-11-19 NOTE — Progress Notes (Signed)
PCP - Dr. Meridee Score  Cardiologist - Dr. Leland Her  Endocrine-none  Pulm-none  Chest x-ray -   EKG - 10/07/21  Stress Test -   ECHO - 09/30/21  Cardiac Cath -   AICD- PM- LOOP-  Sleep Study -  CPAP -   LABS-CBC., CMP, PT, PTT, T/S, UA, PCR  ASA- to continue  ERAS-to continue  HA1C- Fasting Blood Sugar -  Checks Blood Sugar _____ times a day  Anesthesia-  Pt denies having chest pain, sob, or fever at this time. All instructions explained to the pt, with a verbal understanding of the material. Pt agrees to go over the instructions while at home for a better understanding. Pt also instructed to self quarantine after being tested for COVID-19. The opportunity to ask questions was provided.

## 2021-11-19 NOTE — Progress Notes (Signed)
Ryan Franklin has never had a stress test or cardiac catheter.  Patient saw Dr. Rennis Golden, there was discussion regarding placing a 30 day monitor, Dr, Rennis Golden decided that it was not needed.  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  Ryan Franklin reports that he has continued to take ASA and Plavix and he takes a research medication post CVA as well. Ryan Franklin reports that Dr. Chestine Spore is aware and did not tell him to hold it.

## 2021-11-19 NOTE — Pre-Procedure Instructions (Addendum)
Ryan Franklin  11/19/2021    Your procedure is scheduled on Monday, August 14.  Report to [facility] Admitting at 1000 A.M.  Call this number if you have problems the morning of surgery:  [phone] this is the Pre- Surgery desk        For any other questions, please call [phone], Monday - Friday 8 AM - 4 PM.  Remember:  Do not eat or drink after midnight  Take these medicines the morning of surgery with A SIP OF WATER: Aspirin Follow instructions from Dr. Ophelia Charter office regarding Aspirin and Plavix.    STOP takingAspirin Products [employer], Excedrin Migraine), Ibuprofen (Advil), Naproxen (Aleve), Vitamins and Herbal Products (ie Fish Oil).    Special instructions:    [place]- Preparing For Surgery  Before surgery, you can play an important role. Because skin is not sterile, your skin needs to be as free of germs as possible. You can reduce the number of germs on your skin by washing with CHG (chlorahexidine gluconate) Soap before surgery.  CHG is an antiseptic cleaner which kills germs and bonds with the skin to continue killing germs even after washing.    Oral Hygiene is also important to reduce your risk of infection.  Remember - BRUSH YOUR TEETH THE MORNING OF SURGERY WITH YOUR REGULAR TOOTHPASTE  Please do not use if you have an allergy to CHG or antibacterial soaps. If your skin becomes reddened/irritated stop using the CHG.  Do not shave (including legs and underarms) for at least 48 hours prior to first CHG shower. It is OK to shave your face.  Please follow these instructions carefully.   Shower the NIGHT BEFORE SURGERY and the MORNING OF SURGERY with CHG.   If you chose to wash your hair, wash your hair first as usual with your normal shampoo.  After you shampoo, wash your face and private area with the soap you use at home, then rinse your hair and body thoroughly to remove the shampoo and soap.  Use CHG as you would any other liquid  soap. You can apply CHG directly to the skin and wash gently with a scrungie or a clean washcloth.   Apply the CHG Soap to your body ONLY FROM THE NECK DOWN.  Do not use on open wounds or open sores. Avoid contact with your eyes, ears, mouth and genitals (private parts).   Wash thoroughly, paying special attention to the area where your surgery will be performed.  Thoroughly rinse your body with warm water from the neck down.  DO NOT shower/wash with your normal soap after using and rinsing off the CHG Soap.  Pat yourself dry with a CLEAN TOWEL.  Wear CLEAN PAJAMAS to bed the night before surgery, wear comfortable clothes the morning of surgery  Place CLEAN SHEETS on your bed the night of your first shower and DO NOT SLEEP WITH PETS.  Day of Surgery: Shower as instructed above. Do not apply any deodorants/lotions, powders or colognes.  Please wear clean clothes to the hospital/surgery center.   Remember to brush your teeth WITH YOUR REGULAR TOOTHPASTE.  Do not wear jewelry, make-up or nail polish.  Do not shave 48 hours prior to surgery.  Men may shave face and neck.  Do not bring valuables to the hospital.  [facility] is not responsible for any belongings or valuables.  Contacts, dentures or bridgework may not be worn into surgery.  Leave your suitcase in the car.  After surgery it may be brought to your room.  For patients admitted to the hospital, discharge time will be determined by your treatment team.  Patients discharged the day of surgery will not be allowed to drive home.   Please read over the fact sheets that you were given.

## 2021-11-22 ENCOUNTER — Encounter (HOSPITAL_COMMUNITY)
Admission: RE | Disposition: A | Discharge status: Home / Self Care | Payer: Self-pay | Source: Home / Self Care | Attending: Vascular Surgery

## 2021-11-22 ENCOUNTER — Inpatient Hospital Stay (HOSPITAL_COMMUNITY): Payer: Medicare HMO

## 2021-11-22 ENCOUNTER — Inpatient Hospital Stay (HOSPITAL_COMMUNITY): Payer: Medicare HMO | Admitting: Certified Registered Nurse Anesthetist

## 2021-11-22 ENCOUNTER — Encounter (HOSPITAL_COMMUNITY): Payer: Self-pay | Admitting: Vascular Surgery

## 2021-11-22 ENCOUNTER — Inpatient Hospital Stay (HOSPITAL_COMMUNITY)
Admission: RE | Admit: 2021-11-22 | Discharge: 2021-11-23 | DRG: 036 | Disposition: A | Discharge status: Home / Self Care | Payer: Medicare HMO | Attending: Vascular Surgery | Admitting: Vascular Surgery

## 2021-11-22 ENCOUNTER — Other Ambulatory Visit: Payer: Self-pay

## 2021-11-22 DIAGNOSIS — I6529 Occlusion and stenosis of unspecified carotid artery: Secondary | ICD-10-CM | POA: Diagnosis present

## 2021-11-22 DIAGNOSIS — Z006 Encounter for examination for normal comparison and control in clinical research program: Secondary | ICD-10-CM | POA: Diagnosis not present

## 2021-11-22 DIAGNOSIS — Z823 Family history of stroke: Secondary | ICD-10-CM

## 2021-11-22 DIAGNOSIS — I10 Essential (primary) hypertension: Secondary | ICD-10-CM

## 2021-11-22 DIAGNOSIS — I6521 Occlusion and stenosis of right carotid artery: Secondary | ICD-10-CM | POA: Diagnosis not present

## 2021-11-22 DIAGNOSIS — Z8249 Family history of ischemic heart disease and other diseases of the circulatory system: Secondary | ICD-10-CM | POA: Diagnosis not present

## 2021-11-22 DIAGNOSIS — E785 Hyperlipidemia, unspecified: Secondary | ICD-10-CM | POA: Diagnosis present

## 2021-11-22 DIAGNOSIS — I739 Peripheral vascular disease, unspecified: Secondary | ICD-10-CM | POA: Diagnosis present

## 2021-11-22 DIAGNOSIS — Z7902 Long term (current) use of antithrombotics/antiplatelets: Secondary | ICD-10-CM | POA: Diagnosis not present

## 2021-11-22 DIAGNOSIS — Z86718 Personal history of other venous thrombosis and embolism: Secondary | ICD-10-CM | POA: Diagnosis not present

## 2021-11-22 DIAGNOSIS — Z87891 Personal history of nicotine dependence: Secondary | ICD-10-CM | POA: Diagnosis not present

## 2021-11-22 DIAGNOSIS — Z79899 Other long term (current) drug therapy: Secondary | ICD-10-CM

## 2021-11-22 DIAGNOSIS — I63231 Cerebral infarction due to unspecified occlusion or stenosis of right carotid arteries: Secondary | ICD-10-CM

## 2021-11-22 DIAGNOSIS — Z8673 Personal history of transient ischemic attack (TIA), and cerebral infarction without residual deficits: Secondary | ICD-10-CM | POA: Diagnosis not present

## 2021-11-22 DIAGNOSIS — Z01818 Encounter for other preprocedural examination: Principal | ICD-10-CM

## 2021-11-22 HISTORY — PX: TRANSCAROTID ARTERY REVASCULARIZATIONÂ: SHX6778

## 2021-11-22 HISTORY — PX: ULTRASOUND GUIDANCE FOR VASCULAR ACCESS: SHX6516

## 2021-11-22 LAB — CBC
HCT: 46.2 % (ref 39.0–52.0)
Hemoglobin: 15.6 g/dL (ref 13.0–17.0)
MCH: 28.9 pg (ref 26.0–34.0)
MCHC: 33.8 g/dL (ref 30.0–36.0)
MCV: 85.6 fL (ref 80.0–100.0)
Platelets: 199 10*3/uL (ref 150–400)
RBC: 5.4 MIL/uL (ref 4.22–5.81)
RDW: 15.9 % — ABNORMAL HIGH (ref 11.5–15.5)
WBC: 6.9 10*3/uL (ref 4.0–10.5)
nRBC: 0 % (ref 0.0–0.2)

## 2021-11-22 LAB — CREATININE, SERUM
Creatinine, Ser: 1.01 mg/dL (ref 0.61–1.24)
GFR, Estimated: >60 mL/min (ref >60)

## 2021-11-22 LAB — ABO/RH: ABO/RH(D): O NEG

## 2021-11-22 SURGERY — TRANSCAROTID ARTERY REVASCULARIZATION (TCAR)
Anesthesia: General | Site: Neck | Laterality: Right

## 2021-11-22 MED ORDER — ONDANSETRON HCL 4 MG/2ML IJ SOLN
INTRAMUSCULAR | Status: AC
  Filled 2021-11-22: qty 2

## 2021-11-22 MED ORDER — HEPARIN SODIUM (PORCINE) 5000 UNIT/ML IJ SOLN
5000.0000 [IU] | Freq: Three times a day (TID) | INTRAMUSCULAR | Status: DC
  Administered 2021-11-23: 5000 [IU] via SUBCUTANEOUS
  Filled 2021-11-22: qty 1

## 2021-11-22 MED ORDER — LIDOCAINE HCL (PF) 1 % IJ SOLN
INTRAMUSCULAR | Status: AC
Start: 2021-11-22 — End: ?
  Filled 2021-11-22: qty 30

## 2021-11-22 MED ORDER — ACETAMINOPHEN 325 MG PO TABS
325.0000 mg | ORAL_TABLET | ORAL | Status: DC | PRN
  Administered 2021-11-22: 650 mg via ORAL
  Filled 2021-11-22: qty 2

## 2021-11-22 MED ORDER — ORAL CARE MOUTH RINSE: 15.0000 mL | Freq: Once | OROMUCOSAL | Status: AC

## 2021-11-22 MED ORDER — PHENYLEPHRINE HCL-NACL 20-0.9 MG/250ML-% IV SOLN
INTRAVENOUS | Status: DC | PRN
  Administered 2021-11-22: 40 ug/min via INTRAVENOUS

## 2021-11-22 MED ORDER — HYDRALAZINE HCL 20 MG/ML IJ SOLN: 5.0000 mg | INTRAMUSCULAR | Status: DC | PRN

## 2021-11-22 MED ORDER — CLEVIDIPINE BUTYRATE 0.5 MG/ML IV EMUL
INTRAVENOUS | Status: DC | PRN
  Administered 2021-11-22: 2 mg/h via INTRAVENOUS

## 2021-11-22 MED ORDER — LIDOCAINE 2% (20 MG/ML) 5 ML SYRINGE
INTRAMUSCULAR | Status: AC
  Filled 2021-11-22: qty 5

## 2021-11-22 MED ORDER — LABETALOL HCL 5 MG/ML IV SOLN: 10.0000 mg | INTRAVENOUS | Status: DC | PRN

## 2021-11-22 MED ORDER — MIDAZOLAM HCL 2 MG/2ML IJ SOLN
INTRAMUSCULAR | Status: AC
  Filled 2021-11-22: qty 2

## 2021-11-22 MED ORDER — GLYCOPYRROLATE PF 0.2 MG/ML IJ SOSY
PREFILLED_SYRINGE | INTRAMUSCULAR | Status: DC | PRN
  Administered 2021-11-22: .2 mg via INTRAVENOUS

## 2021-11-22 MED ORDER — FENTANYL CITRATE (PF) 250 MCG/5ML IJ SOLN
INTRAMUSCULAR | Status: DC | PRN
  Administered 2021-11-22 (×2): 50 ug via INTRAVENOUS
  Administered 2021-11-22: 100 ug via INTRAVENOUS
  Administered 2021-11-22: 50 ug via INTRAVENOUS

## 2021-11-22 MED ORDER — ARTIFICIAL TEARS OPHTHALMIC OINT
TOPICAL_OINTMENT | OPHTHALMIC | Status: AC
  Filled 2021-11-22: qty 3.5

## 2021-11-22 MED ORDER — PHENYLEPHRINE 80 MCG/ML (10ML) SYRINGE FOR IV PUSH (FOR BLOOD PRESSURE SUPPORT): PREFILLED_SYRINGE | INTRAVENOUS | Status: DC | PRN

## 2021-11-22 MED ORDER — POTASSIUM CHLORIDE CRYS ER 20 MEQ PO TBCR: 20.0000 meq | EXTENDED_RELEASE_TABLET | Freq: Every day | ORAL | Status: DC | PRN

## 2021-11-22 MED ORDER — ACETAMINOPHEN 650 MG RE SUPP: 325.0000 mg | RECTAL | Status: DC | PRN

## 2021-11-22 MED ORDER — SODIUM CHLORIDE 0.9 % IV SOLN: 500.0000 mL | Freq: Once | INTRAVENOUS | Status: DC | PRN

## 2021-11-22 MED ORDER — DOCUSATE SODIUM 100 MG PO CAPS
100.0000 mg | ORAL_CAPSULE | Freq: Every day | ORAL | Status: DC
  Filled 2021-11-22: qty 1

## 2021-11-22 MED ORDER — PHENYLEPHRINE 80 MCG/ML (10ML) SYRINGE FOR IV PUSH (FOR BLOOD PRESSURE SUPPORT)
PREFILLED_SYRINGE | INTRAVENOUS | Status: DC | PRN
  Administered 2021-11-22: 160 ug via INTRAVENOUS

## 2021-11-22 MED ORDER — CEFAZOLIN SODIUM-DEXTROSE 2-4 GM/100ML-% IV SOLN
2.0000 g | INTRAVENOUS | Status: AC
  Administered 2021-11-22: 2 g via INTRAVENOUS
  Filled 2021-11-22: qty 100

## 2021-11-22 MED ORDER — EPHEDRINE 5 MG/ML INJ
INTRAVENOUS | Status: AC
Start: 2021-11-22 — End: ?
  Filled 2021-11-22: qty 10

## 2021-11-22 MED ORDER — STUDY - OCEANIC-STROKE - ASUNDEXIAN 50 MG OR PLACEBO TABLET (PI-SETHI)
1.0000 | ORAL_TABLET | Freq: Every day | ORAL | Status: DC
Start: 2021-11-22 — End: 2021-11-22

## 2021-11-22 MED ORDER — CLOPIDOGREL BISULFATE 75 MG PO TABS
75.0000 mg | ORAL_TABLET | Freq: Every day | ORAL | Status: DC
  Administered 2021-11-23: 75 mg via ORAL
  Filled 2021-11-22: qty 1

## 2021-11-22 MED ORDER — SODIUM CHLORIDE 0.9 % IV SOLN
INTRAVENOUS | Status: DC
Start: 2021-11-22 — End: 2021-11-23

## 2021-11-22 MED ORDER — DEXAMETHASONE SODIUM PHOSPHATE 10 MG/ML IJ SOLN
INTRAMUSCULAR | Status: AC
Start: 2021-11-22 — End: ?
  Filled 2021-11-22: qty 1

## 2021-11-22 MED ORDER — HEPARIN SODIUM (PORCINE) 1000 UNIT/ML IJ SOLN
INTRAMUSCULAR | Status: DC | PRN
  Administered 2021-11-22: 9000 [IU] via INTRAVENOUS

## 2021-11-22 MED ORDER — FAMOTIDINE 20 MG PO TABS
10.0000 mg | ORAL_TABLET | Freq: Every day | ORAL | Status: DC
  Administered 2021-11-22 (×2): 10 mg via ORAL
  Filled 2021-11-22 (×2): qty 1

## 2021-11-22 MED ORDER — HEMOSTATIC AGENTS (NO CHARGE) OPTIME
TOPICAL | Status: DC | PRN
  Administered 2021-11-22: 1 via TOPICAL

## 2021-11-22 MED ORDER — LACTATED RINGERS IV SOLN: INTRAVENOUS | Status: DC | PRN

## 2021-11-22 MED ORDER — ROCURONIUM BROMIDE 10 MG/ML (PF) SYRINGE
PREFILLED_SYRINGE | INTRAVENOUS | Status: AC
  Filled 2021-11-22: qty 10

## 2021-11-22 MED ORDER — OXYCODONE HCL 5 MG PO TABS: 5.0000 mg | ORAL_TABLET | Freq: Once | ORAL | Status: DC | PRN

## 2021-11-22 MED ORDER — AMLODIPINE BESYLATE 5 MG PO TABS
5.0000 mg | ORAL_TABLET | Freq: Every day | ORAL | Status: DC
  Administered 2021-11-22: 5 mg via ORAL
  Filled 2021-11-22: qty 1

## 2021-11-22 MED ORDER — FENTANYL CITRATE (PF) 250 MCG/5ML IJ SOLN
INTRAMUSCULAR | Status: AC
  Filled 2021-11-22: qty 5

## 2021-11-22 MED ORDER — ACETAMINOPHEN 10 MG/ML IV SOLN: 1000.0000 mg | Freq: Once | INTRAVENOUS | Status: DC | PRN

## 2021-11-22 MED ORDER — NORTRIPTYLINE HCL 10 MG PO CAPS
10.0000 mg | ORAL_CAPSULE | Freq: Every day | ORAL | Status: DC
  Administered 2021-11-22: 10 mg via ORAL
  Filled 2021-11-22 (×2): qty 1

## 2021-11-22 MED ORDER — HEPARIN SODIUM (PORCINE) 1000 UNIT/ML IJ SOLN
INTRAMUSCULAR | Status: AC
  Filled 2021-11-22: qty 10

## 2021-11-22 MED ORDER — ONDANSETRON HCL 4 MG/2ML IJ SOLN
INTRAMUSCULAR | Status: DC | PRN
  Administered 2021-11-22: 4 mg via INTRAVENOUS

## 2021-11-22 MED ORDER — LIDOCAINE 2% (20 MG/ML) 5 ML SYRINGE
INTRAMUSCULAR | Status: DC | PRN
  Administered 2021-11-22: 100 mg via INTRAVENOUS

## 2021-11-22 MED ORDER — CEFAZOLIN SODIUM-DEXTROSE 2-4 GM/100ML-% IV SOLN
2.0000 g | Freq: Three times a day (TID) | INTRAVENOUS | Status: AC
  Administered 2021-11-22 – 2021-11-23 (×2): 2 g via INTRAVENOUS
  Filled 2021-11-22 (×2): qty 100

## 2021-11-22 MED ORDER — FAMOTIDINE-CA CARB-MAG HYDROX 10-800-165 MG PO CHEW
1.0000 | CHEWABLE_TABLET | Freq: Every day | ORAL | Status: DC
Start: 2021-11-22 — End: 2021-11-22

## 2021-11-22 MED ORDER — HYDROMORPHONE HCL 1 MG/ML IJ SOLN: 0.2500 mg | INTRAMUSCULAR | Status: DC | PRN

## 2021-11-22 MED ORDER — HEPARIN 6000 UNIT IRRIGATION SOLUTION
Status: AC
  Filled 2021-11-22: qty 500

## 2021-11-22 MED ORDER — PANTOPRAZOLE SODIUM 40 MG PO TBEC
40.0000 mg | DELAYED_RELEASE_TABLET | Freq: Every day | ORAL | Status: DC
  Administered 2021-11-22 – 2021-11-23 (×2): 40 mg via ORAL
  Filled 2021-11-22 (×2): qty 1

## 2021-11-22 MED ORDER — PROPOFOL 10 MG/ML IV BOLUS
INTRAVENOUS | Status: AC
  Filled 2021-11-22: qty 20

## 2021-11-22 MED ORDER — SUGAMMADEX SODIUM 200 MG/2ML IV SOLN
INTRAVENOUS | Status: DC | PRN
  Administered 2021-11-22: 200 mg via INTRAVENOUS

## 2021-11-22 MED ORDER — PROPOFOL 10 MG/ML IV BOLUS
INTRAVENOUS | Status: DC | PRN
  Administered 2021-11-22: 160 mg via INTRAVENOUS

## 2021-11-22 MED ORDER — CLEVIDIPINE BUTYRATE 0.5 MG/ML IV EMUL
INTRAVENOUS | Status: AC
  Filled 2021-11-22: qty 50

## 2021-11-22 MED ORDER — ALUM & MAG HYDROXIDE-SIMETH 200-200-20 MG/5ML PO SUSP: 15.0000 mL | ORAL | Status: DC | PRN

## 2021-11-22 MED ORDER — HEPARIN 6000 UNIT IRRIGATION SOLUTION
Status: DC | PRN
  Administered 2021-11-22: 1

## 2021-11-22 MED ORDER — EPHEDRINE SULFATE-NACL 50-0.9 MG/10ML-% IV SOSY
PREFILLED_SYRINGE | INTRAVENOUS | Status: DC | PRN
  Administered 2021-11-22 (×3): 5 mg via INTRAVENOUS
  Administered 2021-11-22: 10 mg via INTRAVENOUS

## 2021-11-22 MED ORDER — GUAIFENESIN-DM 100-10 MG/5ML PO SYRP: 15.0000 mL | ORAL_SOLUTION | ORAL | Status: DC | PRN

## 2021-11-22 MED ORDER — LACTATED RINGERS IV SOLN: INTRAVENOUS | Status: DC

## 2021-11-22 MED ORDER — ROSUVASTATIN CALCIUM 20 MG PO TABS
20.0000 mg | ORAL_TABLET | Freq: Every day | ORAL | Status: DC
  Filled 2021-11-22: qty 1

## 2021-11-22 MED ORDER — OXYCODONE HCL 5 MG/5ML PO SOLN: 5.0000 mg | Freq: Once | ORAL | Status: DC | PRN

## 2021-11-22 MED ORDER — CHLORHEXIDINE GLUCONATE CLOTH 2 % EX PADS: 6.0000 | MEDICATED_PAD | Freq: Once | CUTANEOUS | Status: DC

## 2021-11-22 MED ORDER — MAGNESIUM SULFATE 2 GM/50ML IV SOLN: 2.0000 g | Freq: Every day | INTRAVENOUS | Status: DC | PRN

## 2021-11-22 MED ORDER — ONDANSETRON HCL 4 MG/2ML IJ SOLN: 4.0000 mg | Freq: Once | INTRAMUSCULAR | Status: DC | PRN

## 2021-11-22 MED ORDER — METOPROLOL TARTRATE 5 MG/5ML IV SOLN: 2.0000 mg | INTRAVENOUS | Status: DC | PRN

## 2021-11-22 MED ORDER — ASPIRIN 81 MG PO TBEC
81.0000 mg | DELAYED_RELEASE_TABLET | Freq: Every day | ORAL | Status: DC
  Administered 2021-11-23: 81 mg via ORAL
  Filled 2021-11-22: qty 1

## 2021-11-22 MED ORDER — ONDANSETRON HCL 4 MG/2ML IJ SOLN: 4.0000 mg | Freq: Four times a day (QID) | INTRAMUSCULAR | Status: DC | PRN

## 2021-11-22 MED ORDER — PROTAMINE SULFATE 10 MG/ML IV SOLN
INTRAVENOUS | Status: DC | PRN
  Administered 2021-11-22: 50 mg via INTRAVENOUS

## 2021-11-22 MED ORDER — PROTAMINE SULFATE 10 MG/ML IV SOLN
INTRAVENOUS | Status: AC
  Filled 2021-11-22: qty 5

## 2021-11-22 MED ORDER — PHENYLEPHRINE 80 MCG/ML (10ML) SYRINGE FOR IV PUSH (FOR BLOOD PRESSURE SUPPORT)
PREFILLED_SYRINGE | INTRAVENOUS | Status: AC
  Filled 2021-11-22: qty 10

## 2021-11-22 MED ORDER — GLYCOPYRROLATE PF 0.2 MG/ML IJ SOSY
PREFILLED_SYRINGE | INTRAMUSCULAR | Status: AC
  Filled 2021-11-22: qty 1

## 2021-11-22 MED ORDER — ROCURONIUM BROMIDE 10 MG/ML (PF) SYRINGE
PREFILLED_SYRINGE | INTRAVENOUS | Status: DC | PRN
  Administered 2021-11-22: 90 mg via INTRAVENOUS

## 2021-11-22 MED ORDER — CHLORHEXIDINE GLUCONATE 0.12 % MT SOLN
15.0000 mL | Freq: Once | OROMUCOSAL | Status: AC
  Administered 2021-11-22: 15 mL via OROMUCOSAL
  Filled 2021-11-22: qty 15

## 2021-11-22 MED ORDER — DEXAMETHASONE SODIUM PHOSPHATE 10 MG/ML IJ SOLN
INTRAMUSCULAR | Status: DC | PRN
  Administered 2021-11-22: 5 mg via INTRAVENOUS

## 2021-11-22 MED ORDER — IODIXANOL 320 MG/ML IV SOLN
INTRAVENOUS | Status: DC | PRN
  Administered 2021-11-22: 16 mL

## 2021-11-22 MED ORDER — PHENOL 1.4 % MT LIQD: 1.0000 | OROMUCOSAL | Status: DC | PRN

## 2021-11-22 MED ORDER — 0.9 % SODIUM CHLORIDE (POUR BTL) OPTIME
TOPICAL | Status: DC | PRN
  Administered 2021-11-22: 1000 mL

## 2021-11-22 MED ORDER — MIDAZOLAM HCL 2 MG/2ML IJ SOLN
INTRAMUSCULAR | Status: DC | PRN
  Administered 2021-11-22: 2 mg via INTRAVENOUS

## 2021-11-22 MED ORDER — SODIUM CHLORIDE 0.9 % IV SOLN: INTRAVENOUS | Status: DC

## 2021-11-22 SURGICAL SUPPLY — 53 items
BAG BANDED W/RUBBER/TAPE 36X54 (MISCELLANEOUS) ×3 IMPLANT
BAG COUNTER SPONGE SURGICOUNT (BAG) ×3 IMPLANT
BALLN STERLING RX 5X30X80 (BALLOONS) ×3
BALLOON STERLING RX 5X30X80 (BALLOONS) IMPLANT
CANISTER SUCT 3000ML PPV (MISCELLANEOUS) ×3 IMPLANT
CLIP VESOCCLUDE MED 6/CT (CLIP) ×3 IMPLANT
CLIP VESOCCLUDE SM WIDE 6/CT (CLIP) ×3 IMPLANT
COVER DOME SNAP 22 D (MISCELLANEOUS) ×3 IMPLANT
COVER PROBE W GEL 5X96 (DRAPES) ×3 IMPLANT
DERMABOND ADVANCED (GAUZE/BANDAGES/DRESSINGS) ×2
DERMABOND ADVANCED .7 DNX12 (GAUZE/BANDAGES/DRESSINGS) ×2 IMPLANT
DRAPE FEMORAL ANGIO 80X135IN (DRAPES) ×3 IMPLANT
ELECT REM PT RETURN 9FT ADLT (ELECTROSURGICAL) ×3
ELECTRODE REM PT RTRN 9FT ADLT (ELECTROSURGICAL) ×2 IMPLANT
GLOVE BIO SURGEON STRL SZ7.5 (GLOVE) ×3 IMPLANT
GLOVE BIOGEL PI IND STRL 6.5 (GLOVE) IMPLANT
GLOVE BIOGEL PI IND STRL 7.0 (GLOVE) IMPLANT
GLOVE BIOGEL PI IND STRL 7.5 (GLOVE) IMPLANT
GLOVE BIOGEL PI INDICATOR 6.5 (GLOVE) ×3
GLOVE BIOGEL PI INDICATOR 7.0 (GLOVE) ×1
GLOVE BIOGEL PI INDICATOR 7.5 (GLOVE) ×1
GOWN STRL REUS W/ TWL LRG LVL3 (GOWN DISPOSABLE) ×4 IMPLANT
GOWN STRL REUS W/ TWL XL LVL3 (GOWN DISPOSABLE) ×2 IMPLANT
GOWN STRL REUS W/TWL LRG LVL3 (GOWN DISPOSABLE) ×3
GOWN STRL REUS W/TWL XL LVL3 (GOWN DISPOSABLE) ×2
GUIDEWIRE ENROUTE 0.014 (WIRE) ×3 IMPLANT
HEMOSTAT SNOW SURGICEL 2X4 (HEMOSTASIS) ×1 IMPLANT
INTRODUCER KIT GALT 7CM (INTRODUCER) ×3
KIT BASIN OR (CUSTOM PROCEDURE TRAY) ×3 IMPLANT
KIT ENCORE 26 ADVANTAGE (KITS) ×3 IMPLANT
KIT INTRODUCER GALT 7 (INTRODUCER) ×2 IMPLANT
KIT TURNOVER KIT B (KITS) ×3 IMPLANT
NDL HYPO 25GX1X1/2 BEV (NEEDLE) IMPLANT
NEEDLE HYPO 25GX1X1/2 BEV (NEEDLE) IMPLANT
PACK CAROTID (CUSTOM PROCEDURE TRAY) ×3 IMPLANT
POSITIONER HEAD DONUT 9IN (MISCELLANEOUS) ×3 IMPLANT
SET MICROPUNCTURE 5F STIFF (MISCELLANEOUS) ×3 IMPLANT
SHEATH PROBE COVER 6X72 (BAG) ×1 IMPLANT
STENT TRANSCAROTID SYSTEM 9X40 (Permanent Stent) ×1 IMPLANT
SUT MNCRL AB 4-0 PS2 18 (SUTURE) ×3 IMPLANT
SUT PROLENE 5 0 C 1 24 (SUTURE) ×4 IMPLANT
SUT SILK 2 0 PERMA HAND 18 BK (SUTURE) ×3 IMPLANT
SUT VIC AB 3-0 SH 27 (SUTURE) ×1
SUT VIC AB 3-0 SH 27X BRD (SUTURE) ×2 IMPLANT
SYR 10ML LL (SYRINGE) ×9 IMPLANT
SYR 20ML LL LF (SYRINGE) ×3 IMPLANT
SYR CONTROL 10ML LL (SYRINGE) IMPLANT
SYSTEM TRANSCAROTID NEUROPRTCT (MISCELLANEOUS) ×2 IMPLANT
TOWEL GREEN STERILE (TOWEL DISPOSABLE) ×3 IMPLANT
TOWEL GREEN STERILE FF (TOWEL DISPOSABLE) ×1 IMPLANT
TRANSCAROTID NEUROPROTECT SYS (MISCELLANEOUS) ×3
WATER STERILE IRR 1000ML POUR (IV SOLUTION) ×3 IMPLANT
WIRE BENTSON .035X145CM (WIRE) ×3 IMPLANT

## 2021-11-22 NOTE — Plan of Care (Signed)

## 2021-11-22 NOTE — Op Note (Addendum)
Date: November 22, 2021  Preoperative diagnosis: Symptomatic right internal carotid artery stenosis approximately 60%  Postoperative diagnosis: Same  Procedure: 1.  Ultrasound-guided access left common femoral vein for delivery of TCAR venous sheath 2.  Transcatheter placement of intravascular stent in the right cervical carotid artery after cutdown including angioplasty with distal embolic protection (right TCAR)  Surgeon: Dr. Cephus Shelling, MD  Assistant: Mosetta Pigeon, [place]  Indications: 71 year old male that had two right-sided events including a retinal embolus last year and most recently a right MCA infarct.  He was evaluated by cardiology who did not feel he had any underlying evidence of atrial fibrillation or other arrhythmia to explain his embolic event.  I was asked to evaluate him by cardiology for right carotid revascularization.  He presents today for right TCAR after risks benefits discussed.  An assistant was needed for exposure and expedite the case including for placement of the arterial sheath and controlling the wire during angioplasty and stent placement.    Findings: Ultrasound-guided access left common femoral vein for delivery of venous sheath for flow reversal.  Transverse incision above the right clavicle where the common carotid artery was dissected out and this was accessed with the arterial sheath.  Once we went on active flow reversal there was about 60% ulcerated plaque in the right proximal internal carotid artery.  This was angioplastied with a 5 mm x 30 mm angioplasty balloon and stented with a 9 mm x 40 mm Enroute stent.  Widely patent stent at completion with no significant residual stenosis.  Patient awoke neuro intact.  The filter was full of embolic material at completion after carotid intervention.  Anesthesia: General  Flow reversal time: 8 min  Details: Patient was taken to the operating room after informed consent was obtained.  Placed on the  operative table in supine position.  General endotracheal anesthesia was induced.  A bump was placed under the shoulders and his neck was turned away.  I used a sterile ultrasound probe to mark the common carotid artery just above the right clavicle.  The right neck and bilateral groins were prepped and draped in standard sterile fashion.  Antibiotics were given.  Timeout performed.  Initially used ultrasound to evaluate the left common femoral vein that was patent and image was saved.  This was accessed with micro access needle and placed a microwire and then a microsheath.  I had good venous return.  I then made a transverse incision one fingerbreadth above the right clavicle dissected down through the platysma with Bovie cautery and used wheatlander retractors to dissect between the heads of the sternocleidomastoid.  Ultimately the jugular vein was dissected away and the vagus nerve was identified and preserved.  We then dissected out the common carotid in the base of the wound and controlled this with an umbilical tape and a large vessel loop.  Patient was given 100 units/kg IV heparin.  We checked an ACT to maintain greater than 250.  Ultimately we accessed the pursestring with a micro access needle and placed a microwire and then a microsheath.  A right carotid angiogram was obtained and the lesion was identified in the right internal carotid artery as well as the bifurcation.  We then advanced a J-wire to just below the carotid bifurcation and upsized to the larger arterial sheath.  This was secured in place with multiple nylon sutures.  I then connected arterial sheath in the neck to a filter to the venous sheath in the  groin.  We then got several images to get a roadmap of where the internal carotid artery stenosis was located.  We then performed a TCAR timeout.  I then went on active clamp on the common carotid and established active flow reversal.  We confirmed that we had good flow in the venous sheath.   We then crossed the internal carotid artery with a wire and then angioplastied this with a 5 mm x 30 mm angioplasty balloon and stented with a 9 mm x 40 mm Enroute stent.  There was no significant residual stenosis.  We allowed 2 minutes of active flow reversal.  We then disconnected the filter gave the blood back in the venous sheath.  The arterial sheath was removed in the common carotid and the pursestring was tied down.  We then used Surgicel snow for hemostasis.  Protamine was given for reversal after we listened with pencil Doppler.  The wound was then closed with 3-0 Vicryl 4-0 Monocryl and Dermabond.  The groin incision was closed with Dermabond after we pulled the sheath and held pressure for 5 minutes.  Patient was awakened and taken to holding in stable condition with no new neurodeficits.  Complication: None  Condition: Stable  Cephus Shelling, MD [facility] Office: [phone]   Cephus Shelling

## 2021-11-22 NOTE — Anesthesia Procedure Notes (Signed)
Procedure Name: Intubation Date/Time: 11/22/2021 1:29 PM  Performed by: Reece Agar, CRNAPre-anesthesia Checklist: Patient identified, Emergency Drugs available, Suction available and Patient being monitored Patient Re-evaluated:Patient Re-evaluated prior to induction Oxygen Delivery Method: Circle System Utilized Preoxygenation: Pre-oxygenation with 100% oxygen Induction Type: IV induction Ventilation: Mask ventilation without difficulty and Oral airway inserted - appropriate to patient size Laryngoscope Size: Mac and 4 Grade View: Grade I Tube type: Oral Tube size: 7.5 mm Number of attempts: 1 Airway Equipment and Method: Stylet and Oral airway Placement Confirmation: ETT inserted through vocal cords under direct vision, positive ETCO2 and breath sounds checked- equal and bilateral Secured at: 23 cm Tube secured with: Tape Dental Injury: Teeth and Oropharynx as per pre-operative assessment

## 2021-11-22 NOTE — Progress Notes (Signed)
[facility] Stroke Research Study Note   Patient is enrolled in [place] stroke research study ( asundexian versus placebo ) .  He underwent elective right carotid TCAR procedure today.  He did not inform us that he was scheduled for this procedure.  He should have stopped the study medication 3 days prior to the procedure as per protocol.  The vascular surgeon was apparently unaware that patient was enrolled in the study and was on this medication.  I immediately informed Dr. Chestine Spore the vascular surgeon when I found out but patient had just finished surgery.  Patient seems to be doing fine without any significant bleeding.  His neurological exam is at baseline with no deficits. NIHSS 0. The study monitor was informed in an email.  Recommend hold study medication for now.  Continue close observation and we will make decision on resumption of study medication when he is ready for discharge hopefully tomorrow.  Delia Heady, MD

## 2021-11-22 NOTE — Progress Notes (Signed)
Patient underwent right TCAR today for a symptomatic right ICA stenosis and did well.  He was seen in the recovery room neurologically intact.  I was contacted by Dr. Pearlean Brownie after his case was completed given concern for Ryan Franklin being enrolled in the [facility] stroke study and the chance that he got placebo versus asundexian.  When I evaluated him in the office, I reviewed his discharge summary from the hospitalist that indicated he was being discharged on dual antiplatelet therapy with aspirin Plavix alone.  The last neurology note in the hospital indicated he was considering possible particpation in the [place] stroke prevention study with plans for discharge on aspirin and plavix.  I specifically asked the patient about him being on other blood thinners during our clinic visit last week and he stated he was only on aspirin Plavix.  He did not mention the stroke study.  I was unaware the patient was enrolled in the [place] stroke trial and the chance he was taking [place].  Fortunately he has done well postop and looks fine in the recovery room.  No signs of bleeding etc.  Neuro intact.  We will continue to follow and he will be admitted post-op.    Ryan Shelling, MD [facility] Office: [phone]   Ryan Franklin

## 2021-11-22 NOTE — H&P (Signed)
History and Physical Interval Note:  11/22/2021 1:06 PM  Ryan Franklin  has presented today for surgery, with the diagnosis of Symptomatic carotid artery stenosis, right.  The various methods of treatment have been discussed with the patient and family. After consideration of risks, benefits and other options for treatment, the patient has consented to  Procedure(s): Right Transcarotid Artery Revascularization (Right) as a surgical intervention.  The patient's history has been reviewed, patient examined, no change in status, stable for surgery.  I have reviewed the patient's chart and labs.  Questions were answered to the patient's satisfaction.    Right TCAR.  Risk and benefits again discussed.    Cephus Shelling  Patient name: Ryan Franklin       MRN: [id number]        DOB: [birth year]        Sex: male   REASON FOR CONSULT: Evaluate for symptomatic carotid stenosis on the right   HPI: Ryan Franklin is a 71 y.o. male, with history of hypertension and hyperlipidemia that presents for evaluation of a symptomatic right carotid stenosis.  Patient was seen last year by Dr. Darrick Penna with a right retinal embolus.  Medical management was recommended for his carotid as the stenosis was about 50%.  Most recently this year he developed left leg numbness and presented with evidence of right MCA distribution stroke.  He was evaluated by neurology who thought it was embolic and ultimately sent him to see Dr. Bufford Buttner with cardiology.  He has no evidence of atrial fibrillation and Dr. Bufford Buttner contacted vascular to further evaluate.  Patient states he is back to his neurologic baseline.  No history of neck surgery or neck radiation.  Remains on plavix and statin.         Past Medical History:  Diagnosis Date   Hyperlipidemia     Hypertension     Stroke [facility])             Past Surgical History:  Procedure Laterality Date   BUBBLE STUDY   09/30/2021    Procedure: BUBBLE STUDY;  Surgeon: Chrystie Nose, MD;  Location: [facility] ENDOSCOPY;  Service: Cardiovascular;;   HERNIA REPAIR       TEE WITHOUT CARDIOVERSION N/A 09/30/2021    Procedure: TRANSESOPHAGEAL ECHOCARDIOGRAM (TEE);  Surgeon: Chrystie Nose, MD;  Location: [facility] ENDOSCOPY;  Service: Cardiovascular;  Laterality: N/A;           Family History  Problem Relation Age of Onset   Heart attack Mother     Heart disease Mother     Heart disease Father     Stroke Father        SOCIAL HISTORY: Social History         Socioeconomic History   Marital status: Single      Spouse name: Not on file   Number of children: Not on file   Years of education: Not on file   Highest education level: Not on file  Occupational History   Occupation: works for himself   Occupation: Science writer  Tobacco Use   Smoking status: Never   Smokeless tobacco: Never  Vaping Use   Vaping Use: Never used  Substance and Sexual Activity   Alcohol use: Yes      Comment: occ   Drug use: Never   Sexual activity: Not on file  Other Topics Concern   Not on file  Social History Narrative    Right handed  Lives alone two story home    Drinks caffeine    Social Determinants of Health    Financial Resource Strain: Not on file  Food Insecurity: Not on file  Transportation Needs: Not on file  Physical Activity: Not on file  Stress: Not on file  Social Connections: Not on file  Intimate Partner Violence: Not on file      No Known Allergies         Current Outpatient Medications  Medication Sig Dispense Refill   cholecalciferol (VITAMIN D3) 25 MCG (1000 UNIT) tablet Take 1,000 Units by mouth daily.       clopidogrel (PLAVIX) 75 MG tablet Take 1 tablet (75 mg total) by mouth daily. 30 tablet 2   famotidine-calcium carbonate-magnesium hydroxide (PEPCID COMPLETE) 10-800-165 MG chewable tablet Chew 1 tablet by mouth every evening.       Magnesium 500 MG TABS Take 1 tablet by mouth daily.       Multiple Vitamin (MULTI VITAMIN) TABS Take 1  tablet by mouth daily.       nortriptyline (PAMELOR) 10 MG capsule Take 2 capsules (20 mg total) by mouth at bedtime. (Patient taking differently: Take 10 mg by mouth at bedtime.) 60 capsule 5   rosuvastatin (CRESTOR) 20 MG tablet Take 1 tablet (20 mg total) by mouth daily. 30 tablet 2   Study - OCEANIC-STROKE - asundexian 50 mg or placebo tablet (PI-Sethi) Take 1 tablet (50 mg total) by mouth daily. For Investigational Use Only. Take at the same time each day (preferably in the morning). Tablet should be swallowed whole with water; it CANNOT be crushed or broken. Please contact [facility] for any questions or concerns regarding this medication. 98 tablet 0   triamcinolone cream (KENALOG) 0.1 % Apply 1 Application topically daily as needed (For dermatitis).       amLODipine (NORVASC) 5 MG tablet Take 1 tablet (5 mg total) by mouth daily for 30 days. (Patient taking differently: Take 5 mg by mouth every evening.) 30 tablet 0    No current facility-administered medications for this visit.      REVIEW OF SYSTEMS:  [X]  denotes positive finding, [ ]  denotes negative finding Cardiac   Comments:  Chest pain or chest pressure:      Shortness of breath upon exertion:      Short of breath when lying flat:      Irregular heart rhythm:             Vascular      Pain in calf, thigh, or hip brought on by ambulation:      Pain in feet at night that wakes you up from your sleep:       Blood clot in your veins:      Leg swelling:              Pulmonary      Oxygen at home:      Productive cough:       Wheezing:              Neurologic      Sudden weakness in arms or legs:       Sudden numbness in arms or legs:       Sudden onset of difficulty speaking or slurred speech:      Temporary loss of vision in one eye:       Problems with dizziness:              Gastrointestinal  Blood in stool:       Vomited blood:              Genitourinary      Burning when urinating:        Blood in urine:             Psychiatric      Major depression:              Hematologic      Bleeding problems:      Problems with blood clotting too easily:             Skin      Rashes or ulcers:             Constitutional      Fever or chills:          PHYSICAL EXAM:     Vitals:    11/16/21 0932 11/16/21 0933  BP: (!) 148/84 (!) 149/79  Pulse: 68 65  Resp: 16    Temp: 98 F (36.7 C)    TempSrc: Temporal    SpO2: 97%    Weight: 203 lb (92.1 kg)    Height: 5\' 10"  (1.778 m)        GENERAL: The patient is a well-nourished male, in no acute distress. The vital signs are documented above. CARDIAC: There is a regular rate and rhythm.  VASCULAR:  No previous neck incisions PULMONARY: No respiratory distress. ABDOMEN: Soft and non-tender. MUSCULOSKELETAL: There are no major deformities or cyanosis. NEUROLOGIC: No focal weakness or paresthesias are detected.  Cranial nerves II through XII grossly intact. SKIN: There are no ulcers or rashes noted. PSYCHIATRIC: The patient has a normal affect.   DATA:    CTA reviewed from 09/27/2021 with ulcerated right ICA plaque >50%   MRI 09/27/2021 with numerous small infarcts throughout the right MCA territory   Assessment/Plan:   71 year old male presents for evaluation of symptomatic right ICA stenosis.  I reviewed his MRI on 09/27/2021 that shows numerous small infarcts scattered throughout the right MCA territory.  His CTA neck on 09/26/2021 by my review shows an ulcerated plaque in the proximal ICA over 50% and closer to 60%.  He has had no evidence of atrial fibrillation after evaluation by cardiology.  This is actually a recurrent event given he had a right retinal embolus last year and was evaluated by Dr. 09/28/2021.  Given the recurrent nature of this I think he would benefit from carotid intervention and also discussed that I think this is over 50% which guidelines would support intervention.  I discussed carotid endarterectomy but  the patient has done research and would prefer a carotid stent with flow reversal using TCAR.  I will get him scheduled for Monday in the operating room.  Discussed he continue aspirin Plavix statin.  Risk benefits discussed including 1% perioperative stroke risk.  Discussed intervention is for stroke risk reduction.     Sunday, MD [facility] [place] Office: [phone]

## 2021-11-22 NOTE — Anesthesia Preprocedure Evaluation (Signed)
Anesthesia Evaluation  Patient identified by MRN, date of birth, ID band Patient awake    Reviewed: Allergy & Precautions, NPO status , Patient's Chart, lab work & pertinent test results  Airway Mallampati: II  TM Distance: >3 FB Neck ROM: Full    Dental no notable dental hx.    Pulmonary neg pulmonary ROS, former smoker,    Pulmonary exam normal breath sounds clear to auscultation       Cardiovascular hypertension, Pt. on medications + Peripheral Vascular Disease  Normal cardiovascular exam Rhythm:Regular Rate:Normal     Neuro/Psych CVA negative psych ROS   GI/Hepatic negative GI ROS, Neg liver ROS,   Endo/Other  negative endocrine ROS  Renal/GU negative Renal ROS  negative genitourinary   Musculoskeletal negative musculoskeletal ROS (+)   Abdominal   Peds negative pediatric ROS (+)  Hematology negative hematology ROS (+)   Anesthesia Other Findings   Reproductive/Obstetrics negative OB ROS                             Anesthesia Physical Anesthesia Plan  ASA: 3  Anesthesia Plan: General   Post-op Pain Management: Minimal or no pain anticipated   Induction: Intravenous  PONV Risk Score and Plan: 2 and Ondansetron, Dexamethasone and Treatment may vary due to age or medical condition  Airway Management Planned: Oral ETT  Additional Equipment: Arterial line  Intra-op Plan:   Post-operative Plan: Extubation in OR  Informed Consent: I have reviewed the patients History and Physical, chart, labs and discussed the procedure including the risks, benefits and alternatives for the proposed anesthesia with the patient or authorized representative who has indicated his/her understanding and acceptance.     Dental advisory given  Plan Discussed with: CRNA and Surgeon  Anesthesia Plan Comments:         Anesthesia Quick Evaluation

## 2021-11-22 NOTE — Anesthesia Procedure Notes (Signed)
Arterial Line Insertion Start/End8/14/2023 11:00 AM, 11/22/2021 11:10 AM Performed by: Tressia Miners, CRNA  Patient location: Pre-op. Preanesthetic checklist: patient identified, IV checked, site marked, risks and benefits discussed, surgical consent, monitors and equipment checked, pre-op evaluation, timeout performed and anesthesia consent Lidocaine 1% used for infiltration Left, radial was placed Catheter size: 20 G Hand hygiene performed  and maximum sterile barriers used   Attempts: 1 Procedure performed without using ultrasound guided technique. Following insertion, dressing applied and Biopatch. Post procedure assessment: normal and unchanged  Patient tolerated the procedure well with no immediate complications.

## 2021-11-22 NOTE — Anesthesia Postprocedure Evaluation (Signed)
Anesthesia Post Note  Patient: Ryan Franklin  Procedure(s) Performed: RIGHT TRANSCAROTID ARTERY REVASCULARIZATION (Right: Neck) ULTRASOUND GUIDANCE FOR VASCULAR ACCESS (Left: Groin)     Patient location during evaluation: PACU Anesthesia Type: General Level of consciousness: awake and alert Pain management: pain level controlled Vital Signs Assessment: post-procedure vital signs reviewed and stable Respiratory status: spontaneous breathing, nonlabored ventilation, respiratory function stable and patient connected to nasal cannula oxygen Cardiovascular status: blood pressure returned to baseline and stable Postop Assessment: no apparent nausea or vomiting Anesthetic complications: no   No notable events documented.  Last Vitals:  Vitals:   11/22/21 1515 11/22/21 1530  BP: 124/85 132/81  Pulse: 86 78  Resp: 11 13  Temp:    SpO2: 94% 94%    Last Pain:  Vitals:   11/22/21 1530  TempSrc:   PainSc: 0-No pain                 Remigio Mcmillon S

## 2021-11-22 NOTE — Transfer of Care (Signed)
Immediate Anesthesia Transfer of Care Note  Patient: Ryan Franklin  Procedure(s) Performed: RIGHT TRANSCAROTID ARTERY REVASCULARIZATION (Right: Neck) ULTRASOUND GUIDANCE FOR VASCULAR ACCESS (Left: Groin)  Patient Location: PACU  Anesthesia Type:General  Level of Consciousness: awake and alert   Airway & Oxygen Therapy: Patient Spontanous Breathing  Post-op Assessment: Report given to RN, Post -op Vital signs reviewed and stable, Patient moving all extremities X 4 and Patient able to stick tongue midline  Post vital signs: Reviewed and stable  Last Vitals:  Vitals Value Taken Time  BP 130/82 11/22/21 1515  Temp    Pulse 86 11/22/21 1515  Resp 13 11/22/21 1515  SpO2 96 % 11/22/21 1515  Vitals shown include unvalidated device data.  Last Pain:  Vitals:   11/22/21 1049  TempSrc:   PainSc: 0-No pain         Complications: No notable events documented.

## 2021-11-23 ENCOUNTER — Encounter (HOSPITAL_COMMUNITY): Payer: Self-pay | Admitting: Vascular Surgery

## 2021-11-23 LAB — CBC
HCT: 43.3 % (ref 39.0–52.0)
Hemoglobin: 14.3 g/dL (ref 13.0–17.0)
MCH: 28.5 pg (ref 26.0–34.0)
MCHC: 33 g/dL (ref 30.0–36.0)
MCV: 86.3 fL (ref 80.0–100.0)
Platelets: 199 10*3/uL (ref 150–400)
RBC: 5.02 MIL/uL (ref 4.22–5.81)
RDW: 15.8 % — ABNORMAL HIGH (ref 11.5–15.5)
WBC: 8.3 10*3/uL (ref 4.0–10.5)
nRBC: 0 % (ref 0.0–0.2)

## 2021-11-23 LAB — BASIC METABOLIC PANEL
Anion gap: 6 (ref 5–15)
BUN: 19 mg/dL (ref 8–23)
CO2: 24 mmol/L (ref 22–32)
Calcium: 8.4 mg/dL — ABNORMAL LOW (ref 8.9–10.3)
Chloride: 108 mmol/L (ref 98–111)
Creatinine, Ser: 1.13 mg/dL (ref 0.61–1.24)
GFR, Estimated: >60 mL/min (ref >60)
Glucose, Bld: 133 mg/dL — ABNORMAL HIGH (ref 70–99)
Potassium: 4.2 mmol/L (ref 3.5–5.1)
Sodium: 138 mmol/L (ref 135–145)

## 2021-11-23 LAB — LIPID PANEL
Cholesterol: 163 mg/dL (ref 0–200)
HDL: 73 mg/dL (ref >40)
LDL Cholesterol: 82 mg/dL (ref 0–99)
Total CHOL/HDL Ratio: 2.2 RATIO
Triglycerides: 39 mg/dL (ref <150)
VLDL: 8 mg/dL (ref 0–40)

## 2021-11-23 LAB — POCT ACTIVATED CLOTTING TIME
Activated Clotting Time: 251 seconds
Activated Clotting Time: 137 seconds

## 2021-11-23 MED ORDER — OXYCODONE-ACETAMINOPHEN 5-325 MG PO TABS: 1.0000 | ORAL_TABLET | Freq: Four times a day (QID) | ORAL | 0 refills | Status: DC | PRN

## 2021-11-23 MED ORDER — ROSUVASTATIN CALCIUM 40 MG PO TABS: 40.0000 mg | ORAL_TABLET | Freq: Every day | ORAL | 3 refills | Status: DC

## 2021-11-23 NOTE — Discharge Instructions (Signed)
   [facility]  Discharge Instructions   Carotid Endarterectomy (CEA)  Please refer to the following instructions for your post-procedure care. Your surgeon or physician assistant will discuss any changes with you.  Activity  You are encouraged to walk as much as you can. You can slowly return to normal activities but must avoid strenuous activity and heavy lifting until your doctor tell you it's OK. Avoid activities such as vacuuming or swinging a golf club. You can drive after one week if you are comfortable and you are no longer taking prescription pain medications. It is normal to feel tired for serval weeks after your surgery. It is also normal to have difficulty with sleep habits, eating, and bowel movements after surgery. These will go away with time.  Bathing/Showering  You may shower after you come home. Do not soak in a bathtub, hot tub, or swim until the incision heals completely.  Incision Care  Shower every day. Clean your incision with mild soap and water. Pat the area dry with a clean towel. You do not need a bandage unless otherwise instructed. Do not apply any ointments or creams to your incision. You may have skin glue on your incision. Do not peel it off. It will come off on its own in about one week. Your incision may feel thickened and raised for several weeks after your surgery. This is normal and the skin will soften over time. For Men Only: It's OK to shave around the incision but do not shave the incision itself for 2 weeks. It is common to have numbness under your chin that could last for several months.  Diet  Resume your normal diet. There are no special food restrictions following this procedure. A low fat/low cholesterol diet is recommended for all patients with vascular disease. In order to heal from your surgery, it is CRITICAL to get adequate nutrition. Your body requires vitamins, minerals, and protein. Vegetables are the best  source of vitamins and minerals. Vegetables also provide the perfect balance of protein. Processed food has little nutritional value, so try to avoid this.        Medications  Resume taking all of your medications unless your doctor or physician assistant tells you not to. If your incision is causing pain, you may take over-the- counter pain relievers such as acetaminophen (Tylenol). If you were prescribed a stronger pain medication, please be aware these medications can cause nausea and constipation. Prevent nausea by taking the medication with a snack or meal. Avoid constipation by drinking plenty of fluids and eating foods with a high amount of fiber, such as fruits, vegetables, and grains. Do not take Tylenol if you are taking prescription pain medications.  Follow Up  Our office will schedule a follow up appointment 2-3 weeks following discharge.  Please call us immediately for any of the following conditions  Increased pain, redness, drainage (pus) from your incision site. Fever of 101 degrees or higher. If you should develop stroke (slurred speech, difficulty swallowing, weakness on one side of your body, loss of vision) you should call 911 and go to the nearest emergency room.  Reduce your risk of vascular disease:  Stop smoking. If you would like help call [facility] at 1-800-QUIT-NOW (1-800-784-8669) or [facility] at 336-586-4000. Manage your cholesterol Maintain a desired weight Control your diabetes Keep your blood pressure down  If you have any questions, please call the office at [phone].   

## 2021-11-23 NOTE — Progress Notes (Signed)
[facility]  Subjective  -no complaints.  Neuro intact.   Objective 112/64 83 97.8 F (36.6 C) 17 95%  Intake/Output Summary (Last 24 hours) at 11/23/2021 0805 Last data filed at 11/22/2021 1631 Gross per 24 hour  Intake 1700 ml  Output 620 ml  Net 1080 ml    Right neck incision clean dry and intact with no hematoma Grossly neurologically intact including cranial nerves II through XII Left groin transfemoral access site clean and dry  Laboratory Lab Results: Recent Labs    11/22/21 1648 11/23/21 0309  WBC 6.9 8.3  HGB 15.6 14.3  HCT 46.2 43.3  PLT 199 199   BMET Recent Labs    11/22/21 1648 11/23/21 0309  NA  --  138  K  --  4.2  CL  --  108  CO2  --  24  GLUCOSE  --  133*  BUN  --  19  CREATININE 1.01 1.13  CALCIUM  --  8.4*    COAG Lab Results  Component Value Date   INR 1.0 11/19/2021   INR 1.0 09/26/2021   No results found for: "PTT"  Assessment/Planning:  Postop day 1 status post right TCAR for a 60% symptomatic stenosis.  He is doing great and is neuro intact this morning.  Neck incision with no hematoma and neuro intact.  Plan discharge today on aspirin Plavix statin.  I will arrange follow-up in 4 weeks with carotid duplex for continued surveillance.  I have discussed with Dr. Pearlean Brownie and will I will instruct him to restart his oceanic stroke trial medication tomorrow.  Discussed he call with questions or concerns.    Cephus Shelling 11/23/2021 8:05 AM --

## 2021-11-23 NOTE — Progress Notes (Signed)
PHARMACIST LIPID MONITORING   Ryan Franklin is a 71 y.o. male admitted on 11/22/2021 with right ICA stenosis s/p TCAR.  Pharmacy has been consulted to optimize lipid-lowering therapy with the indication of secondary prevention for clinical ASCVD.  Recent Labs:  Lipid Panel (last 6 months):   Lab Results  Component Value Date   CHOL 163 11/23/2021   TRIG 39 11/23/2021   HDL 73 11/23/2021   CHOLHDL 2.2 11/23/2021   VLDL 8 11/23/2021   LDLCALC 82 11/23/2021    Hepatic function panel (last 6 months):   Lab Results  Component Value Date   AST 27 11/19/2021   ALT 27 11/19/2021   ALKPHOS 59 11/19/2021   BILITOT 0.6 11/19/2021    SCr (since admission):   Serum creatinine: 1.13 mg/dL 01/75/10 2585 Estimated creatinine clearance: 71.7 mL/min  Current therapy and lipid therapy tolerance Current lipid-lowering therapy: Crestor 20mg /d Previous lipid-lowering therapies (if applicable): Crestor 10mg /d Documented or reported allergies or intolerances to lipid-lowering therapies (if applicable): None  Assessment:   Patient agrees with changes to lipid-lowering therapy  Plan:    1.Statin intensity (high intensity recommended for all patients regardless of the LDL):  Increase Crestor to 40mg  daily  2.Add ezetimibe (if any one of the following):   Not indicated at this time.  3.Refer to lipid clinic:   No  4.Follow-up with:  Cardiology provider - , MD  5.Follow-up labs after discharge:  Changes in lipid therapy were made. Check a lipid panel in 8-12 weeks then annually.      , PharmD Clinical Pharmacist **Pharmacist phone directory can now be found on [email or web address] ([email or web address]).  Listed under [facility].

## 2021-11-28 NOTE — Discharge Summary (Signed)
[facility] Discharge Summary   Patient ID:  Ryan Franklin MRN: [id number] DOB/AGE: 1951/02/18 71 y.o.  Admit date: 11/22/2021 Discharge date: 11/23/21 Date of Surgery: 11/22/2021 Surgeon: Surgeon(s): Cephus Shelling, MD  Admission Diagnosis: Carotid artery stenosis [I65.29] Carotid stenosis [I65.29]  Discharge Diagnoses:  Carotid artery stenosis [I65.29] Carotid stenosis [I65.29]  Secondary Diagnoses: Past Medical History:  Diagnosis Date   Arthritis    DVT (deep venous thrombosis) (HCC)    left leg   Hyperlipidemia    Hypertension    Peripheral vascular disease (HCC)    Stroke (HCC)     Procedure(s): RIGHT TRANSCAROTID ARTERY REVASCULARIZATION ULTRASOUND GUIDANCE FOR VASCULAR ACCESS  Discharged Condition: good  HPI: Ryan Franklin is a 71 y.o. male, with history of hypertension and hyperlipidemia that presents for evaluation of a symptomatic right carotid stenosis. Patient was seen last year by Dr. Darrick Penna with a right retinal embolus on previous occasion.    DATA:    CTA reviewed from 09/27/2021 with ulcerated right ICA plaque >50%   MRI 09/27/2021 with numerous small infarcts throughout the right MCA territory  He was scheduled for TCAR intervention of the right ICA.  Hospital Course:  Ryan Franklin is a 71 y.o. male is S/P Right Procedure(s): RIGHT TRANSCAROTID ARTERY REVASCULARIZATION ULTRASOUND GUIDANCE FOR VASCULAR ACCESS Uneventful post op stays over night.   No neurologic deficits, tolerating PO's, voided and incision healing well.  He is discharged in stable condition.  Follow-up in 4 weeks with carotid duplex for continued surveillance.  Significant Diagnostic Studies: CBC Lab Results  Component Value Date   WBC 8.3 11/23/2021   HGB 14.3 11/23/2021   HCT 43.3 11/23/2021   MCV 86.3 11/23/2021   PLT 199 11/23/2021    BMET    Component Value Date/Time   NA 138 11/23/2021 0309   K 4.2 11/23/2021 0309   CL 108  11/23/2021 0309   CO2 24 11/23/2021 0309   GLUCOSE 133 (H) 11/23/2021 0309   BUN 19 11/23/2021 0309   CREATININE 1.13 11/23/2021 0309   CALCIUM 8.4 (L) 11/23/2021 0309   GFRNONAA >60 11/23/2021 0309   GFRAA >60 08/15/2018 1747   COAG Lab Results  Component Value Date   INR 1.0 11/19/2021   INR 1.0 09/26/2021     Disposition:  Discharge to :Home Discharge Instructions     Call MD for:  redness, tenderness, or signs of infection (pain, swelling, bleeding, redness, odor or green/yellow discharge around incision site)   Complete by: As directed    Call MD for:  severe or increased pain, loss or decreased feeling  in affected limb(s)   Complete by: As directed    Call MD for:  temperature >100.5   Complete by: As directed    Discharge instructions   Complete by: As directed    Restart drug trial medication tomorrow 11/24/21   Resume previous diet   Complete by: As directed       Allergies as of 11/23/2021   No Known Allergies      Medication List     TAKE these medications    amLODipine 5 MG tablet Commonly known as: NORVASC Take 1 tablet (5 mg total) by mouth daily for 30 days. What changed: when to take this   aspirin EC 81 MG tablet Take 81 mg by mouth daily. Swallow whole.   Bioflex Tabs Take 2 tablets by mouth daily.   cholecalciferol 25 MCG (1000 UNIT) tablet Commonly known as: VITAMIN  D3 Take 1,000 Units by mouth daily.   clopidogrel 75 MG tablet Commonly known as: PLAVIX Take 1 tablet (75 mg total) by mouth daily. What changed: when to take this   Magnesium 250 MG Tabs Take 250 mg by mouth daily.   Multi Vitamin Tabs Take 1 tablet by mouth daily. Centrum Silver   nortriptyline 10 MG capsule Commonly known as: PAMELOR Take 2 capsules (20 mg total) by mouth at bedtime. What changed: how much to take   OCEANIC-STROKE asundexian or placebo 50 mg tablet Take 1 tablet (50 mg total) by mouth daily. For Investigational Use Only. Take at the same  time each day (preferably in the morning). Tablet should be swallowed whole with water; it CANNOT be crushed or broken. Please contact [facility] for any questions or concerns regarding this medication.   oxyCODONE-acetaminophen 5-325 MG tablet Commonly known as: PERCOCET/ROXICET Take 1 tablet by mouth every 6 (six) hours as needed.   Pepcid Complete 10-800-165 MG chewable tablet Generic drug: famotidine-calcium carbonate-magnesium hydroxide Chew 1 tablet by mouth at bedtime.   rosuvastatin 40 MG tablet Commonly known as: Crestor Take 1 tablet (40 mg total) by mouth daily. What changed:  medication strength how much to take   triamcinolone cream 0.1 % Commonly known as: KENALOG Apply 1 Application topically daily as needed (For dermatitis).   TURMERIC CURCUMIN PO Take 2,000 mg by mouth daily as needed.   vitamin C 1000 MG tablet Take 1,000 mg by mouth daily.       Verbal and written Discharge instructions given to the patient. Wound care per Discharge AVS  Follow-up Information     Cephus Shelling, MD Follow up in 4 week(s).   Specialty: Vascular Surgery Why: Office will call you to arrange your appt (sent) Contact information: [address] [place] [address] [phone]                 Signed: Mosetta Pigeon 11/28/2021, 9:58 AM  --- For VQI Registry use --- Instructions: Press F2 to tab through selections.  Delete question if not applicable.   Modified Rankin score at D/C (0-6): Rankin Score=0  IV medication needed for:  1. Hypertension: No 2. Hypotension: No  Post-op Complications: No  1. Post-op CVA or TIA: No  If yes: Event classification (right eye, left eye, right cortical, left cortical, verterobasilar, other):   If yes: Timing of event (intra-op, <6 hrs post-op, >=6 hrs post-op, unknown):   2. CN injury: No  If yes: CN  injuried   3. Myocardial infarction: No  If yes: Dx by (EKG or clinical, Troponin):    4.  CHF: No  5.  Dysrhythmia (new): No  6. Wound infection: No  7. Reperfusion symptoms: No  8. Return to OR: No  If yes: return to OR for (bleeding, neurologic, other CEA incision, other):   Discharge medications: Statin use:  Yes ASA use:  Yes Beta blocker use:  No  for medical reason not indicated ACE-Inhibitor use:  No  for medical reason not indicated P2Y12 Antagonist use: [ ]  None, [ x] Plavix, [ ]  Plasugrel, [ ]  Ticlopinine, [ ]  Ticagrelor, [ ]  Other, [ ]  No for medical reason, [ ]  Non-compliant, [ ]  Not-indicated Anti-coagulant use:  [ ]  None, [ ]  Warfarin, [ ]  Rivaroxaban, [ ]  Dabigatran, [ ]  Other, [ ]  No for medical reason, [ ]  Non-compliant, [ ]  Not-indicated

## 2021-12-17 ENCOUNTER — Other Ambulatory Visit: Payer: Self-pay | Admitting: *Deleted

## 2021-12-17 DIAGNOSIS — I6529 Occlusion and stenosis of unspecified carotid artery: Secondary | ICD-10-CM

## 2021-12-17 DIAGNOSIS — Z1211 Encounter for screening for malignant neoplasm of colon: Secondary | ICD-10-CM | POA: Diagnosis not present

## 2021-12-17 DIAGNOSIS — Z1212 Encounter for screening for malignant neoplasm of rectum: Secondary | ICD-10-CM | POA: Diagnosis not present

## 2021-12-23 LAB — COLOGUARD: COLOGUARD: NEGATIVE

## 2021-12-28 ENCOUNTER — Encounter: Payer: Self-pay | Admitting: Vascular Surgery

## 2021-12-28 ENCOUNTER — Ambulatory Visit (HOSPITAL_COMMUNITY)
Admission: RE | Admit: 2021-12-28 | Discharge: 2021-12-28 | Disposition: A | Discharge status: Home / Self Care | Payer: Medicare HMO | Source: Ambulatory Visit | Attending: Vascular Surgery | Admitting: Vascular Surgery

## 2021-12-28 ENCOUNTER — Ambulatory Visit (INDEPENDENT_AMBULATORY_CARE_PROVIDER_SITE_OTHER): Payer: Medicare HMO | Admitting: Vascular Surgery

## 2021-12-28 VITALS — BP 156/74 | HR 87 | Temp 97.9°F | Resp 18 | Ht 70.0 in | Wt 207.0 lb

## 2021-12-28 DIAGNOSIS — I6529 Occlusion and stenosis of unspecified carotid artery: Secondary | ICD-10-CM

## 2021-12-28 NOTE — Progress Notes (Signed)
Patient name: Ryan Franklin MRN: [id number] DOB: 09-21-1950 Sex: male  REASON FOR VISIT: Postop check after right TCAR  HPI: Ryan Franklin is a 71 y.o. male who presents for postop check after right TCAR.  He had a symptomatic 60% right ICA stenosis with ulcerated plaque and underwent right TCAR on 11/22/2021.  He did well postoperatively.  On follow-up today he has had no additional neurologic events.  He remains on aspirin Plavix statin.  Neck incision is healed.  Past Medical History:  Diagnosis Date   Arthritis    DVT (deep venous thrombosis) (HCC)    left leg   Hyperlipidemia    Hypertension    Peripheral vascular disease (HCC)    Stroke [facility])     Past Surgical History:  Procedure Laterality Date   BUBBLE STUDY  09/30/2021   Procedure: BUBBLE STUDY;  Surgeon: Chrystie Nose, MD;  [facility] ENDOSCOPY;  Service: Cardiovascular;;   HERNIA REPAIR     TEE WITHOUT CARDIOVERSION N/A 09/30/2021   Procedure: TRANSESOPHAGEAL ECHOCARDIOGRAM (TEE);  Surgeon: Chrystie Nose, MD;  Location: [facility] ENDOSCOPY;  Service: Cardiovascular;  Laterality: N/A;   TRANSCAROTID ARTERY REVASCULARIZATION  Right 11/22/2021   Procedure: RIGHT TRANSCAROTID ARTERY REVASCULARIZATION;  Surgeon: Cephus Shelling, MD;  Location: [facility] OR;  Service: Vascular;  Laterality: Right;   ULTRASOUND GUIDANCE FOR VASCULAR ACCESS Left 11/22/2021   Procedure: ULTRASOUND GUIDANCE FOR VASCULAR ACCESS;  Surgeon: Cephus Shelling, MD;  Location: [facility] OR;  Service: Vascular;  Laterality: Left;    Family History  Problem Relation Age of Onset   Heart attack Mother    Heart disease Mother    Heart disease Father    Stroke Father     SOCIAL HISTORY: Social History   Tobacco Use   Smoking status: Former    Years: 10.00    Types: Cigarettes    Quit date: 04/1986    Years since quitting: 35.7   Smokeless tobacco: Never  Substance Use Topics   Alcohol use: Yes    Alcohol/week: 7.0 standard drinks of alcohol     Types: 7 Glasses of wine per week    No Known Allergies  Current Outpatient Medications  Medication Sig Dispense Refill   Ascorbic Acid (VITAMIN C) 1000 MG tablet Take 1,000 mg by mouth daily.     aspirin EC 81 MG tablet Take 81 mg by mouth daily. Swallow whole.     Bioflavonoid Products (BIOFLEX) TABS Take 2 tablets by mouth daily.     cholecalciferol (VITAMIN D3) 25 MCG (1000 UNIT) tablet Take 1,000 Units by mouth daily.     clopidogrel (PLAVIX) 75 MG tablet Take 1 tablet (75 mg total) by mouth daily. (Patient taking differently: Take 75 mg by mouth at bedtime.) 30 tablet 2   famotidine-calcium carbonate-magnesium hydroxide (PEPCID COMPLETE) 10-800-165 MG chewable tablet Chew 1 tablet by mouth at bedtime.     Magnesium 250 MG TABS Take 250 mg by mouth daily.     Multiple Vitamin (MULTI VITAMIN) TABS Take 1 tablet by mouth daily. Centrum Silver     nortriptyline (PAMELOR) 10 MG capsule Take 2 capsules (20 mg total) by mouth at bedtime. (Patient taking differently: Take 10 mg by mouth at bedtime.) 60 capsule 5   rosuvastatin (CRESTOR) 40 MG tablet Take 1 tablet (40 mg total) by mouth daily. 90 tablet 3   Study - OCEANIC-STROKE - asundexian 50 mg or placebo tablet (PI-Sethi) Take 1 tablet (50 mg total) by mouth daily.  For Investigational Use Only. Take at the same time each day (preferably in the morning). Tablet should be swallowed whole with water; it CANNOT be crushed or broken. Please contact [facility] for any questions or concerns regarding this medication. 98 tablet 0   TURMERIC CURCUMIN PO Take 2,000 mg by mouth daily as needed.     amLODipine (NORVASC) 5 MG tablet Take 1 tablet (5 mg total) by mouth daily for 30 days. (Patient taking differently: Take 5 mg by mouth at bedtime.) 30 tablet 0   oxyCODONE-acetaminophen (PERCOCET/ROXICET) 5-325 MG tablet Take 1 tablet by mouth every 6 (six) hours as needed. 12 tablet 0   triamcinolone cream (KENALOG) 0.1 % Apply 1  Application topically daily as needed (For dermatitis).     No current facility-administered medications for this visit.    REVIEW OF SYSTEMS:  [X]  denotes positive finding, [ ]  denotes negative finding Cardiac  Comments:  Chest pain or chest pressure:    Shortness of breath upon exertion:    Short of breath when lying flat:    Irregular heart rhythm:        Vascular    Pain in calf, thigh, or hip brought on by ambulation:    Pain in feet at night that wakes you up from your sleep:     Blood clot in your veins:    Leg swelling:         Pulmonary    Oxygen at home:    Productive cough:     Wheezing:         Neurologic    Sudden weakness in arms or legs:     Sudden numbness in arms or legs:     Sudden onset of difficulty speaking or slurred speech:    Temporary loss of vision in one eye:     Problems with dizziness:         Gastrointestinal    Blood in stool:     Vomited blood:         Genitourinary    Burning when urinating:     Blood in urine:        Psychiatric    Major depression:         Hematologic    Bleeding problems:    Problems with blood clotting too easily:        Skin    Rashes or ulcers:        Constitutional    Fever or chills:      PHYSICAL EXAM: Vitals:   12/28/21 1552 12/28/21 1555  BP: 114/78 (!) 156/74  Pulse: 87 87  Resp: 18   Temp: 97.9 F (36.6 C)   TempSrc: Temporal   SpO2: 96%   Weight: 207 lb (93.9 kg)   Height: 5\' 10"  (1.778 m)     GENERAL: The patient is a well-nourished male, in no acute distress. The vital signs are documented above. CARDIAC: There is a regular rate and rhythm.  VASCULAR:  Right neck incision healing with no hematoma NEUROLOGIC: No focal weakness or paresthesias are detected.  Cranial nerves II through XII grossly intact. SKIN: There are no ulcers or rashes noted. PSYCHIATRIC: The patient has a normal affect.  DATA:   Carotid duplex today shows widely patent right carotid stent with minimal 1 to  39% stenosis in the left ICA  Assessment/Plan:  71 y.o. male who presents for postop check after right TCAR.  He had a symptomatic 60% right ICA stenosis with  ulcerated plaque and underwent right TCAR on 11/22/2021.  He looks good today and his neck incision is healing.  Neurologically intact.  Duplex shows widely patent stent.  Discussed continue aspirin Plavix statin from my standpoint.  I will see him in 9 months with carotid duplex.  He is being followed by Dr. Pearlean Brownie with neurology for the [facility] stroke study with asundexian that I learned about after his TCAR procedure.  Discussed given he is one month postop and his carotid duplex looks good today he can stop Plavix if needed for other interventions or procedures in the future.   Cephus Shelling, MD [facility] [place] Office: [phone]

## 2022-01-04 ENCOUNTER — Other Ambulatory Visit: Payer: Self-pay

## 2022-01-04 DIAGNOSIS — I6529 Occlusion and stenosis of unspecified carotid artery: Secondary | ICD-10-CM

## 2022-02-07 NOTE — Progress Notes (Deleted)
NEUROLOGY FOLLOW UP OFFICE NOTE  NETANEL YANNUZZI [id number]  Assessment/Plan:   Scattered small ischemic infarcts in the right MCA territory secondary to unknown embolic source Hypertension Hyperlipidemia Headache stable  1  Secondary stroke prevention as managed by PCP:  - Plavix 75mg  daily  - Rosuvastatin.  LDL goal less than 70  - Normotensive blood pressure  - Hgb A1c goal less than 7 2  Implantable loop recorder 3  Continue nortriptyline 10mg  at bedtime 4  Follow up with Dr. regarding the [facility] Trial.   5  Follow up in ***  Subjective:  URBAN NAVAL is a 71 year old right-handed male with HTN, HLD and PUD with history of GI bleed and DVT whom I saw for headache presents today for recent stroke.  CT/CTA head and neck and MRI brain personally reviewed.  On 09/26/2021, he had episode of lightheadedness with left leg numbness lasting 45 minutes.  Several days prior, he reported brief episode of left arm incoordination.  CT head showed no acute abnormality but follow up MRI revealed numerous scattered small acute infarcts in the right MCA territory.  CTA of head and neck showed atherosclerotic changes at the carotid bifurcations but no large vessel occlusion or hemodynamically significant stenosis.  2D echo with bubble study showed EF 60-65% with no interatrial shunt.  LDL was 107 and Hgb A1c was 5.4.  LE venous doppler showed evidence of known old DVT in left popliteal but no acute DVT.  He was on ASA 81mg  prior to admission and discharged on ASA 81mg  and Plavix 75mg  daily for 21 days followed by Plavix monotherapy.  Home rosuvastatin was increased from 10mg  to 20mg  daily.  He also entered the [facility] trial (on asundexian vs placebo).  He was discharged and had an outpatient TEE on 6/22 which revealed calcification of the aortic valve but no significant valve disease, no thrombus, PFO or other cardiac source of emboli.  Since discharge, he has been feeling well.   This morning in the shower, he had a new pruritic rash which has since resolved.  Denies history of palpitations.  Of note, no headaches since decreasing nortriptyline.       PAST MEDICAL HISTORY: Past Medical History:  Diagnosis Date   Arthritis    DVT (deep venous thrombosis) (HCC)    left leg   Hyperlipidemia    Hypertension    Peripheral vascular disease (HCC)    Stroke [facility])     MEDICATIONS: Current Outpatient Medications on File Prior to Visit  Medication Sig Dispense Refill   amLODipine (NORVASC) 5 MG tablet Take 1 tablet (5 mg total) by mouth daily for 30 days. (Patient taking differently: Take 5 mg by mouth at bedtime.) 30 tablet 0   Ascorbic Acid (VITAMIN C) 1000 MG tablet Take 1,000 mg by mouth daily.     aspirin EC 81 MG tablet Take 81 mg by mouth daily. Swallow whole.     Bioflavonoid Products (BIOFLEX) TABS Take 2 tablets by mouth daily.     cholecalciferol (VITAMIN D3) 25 MCG (1000 UNIT) tablet Take 1,000 Units by mouth daily.     clopidogrel (PLAVIX) 75 MG tablet Take 1 tablet (75 mg total) by mouth daily. (Patient taking differently: Take 75 mg by mouth at bedtime.) 30 tablet 2   famotidine-calcium carbonate-magnesium hydroxide (PEPCID COMPLETE) 10-800-165 MG chewable tablet Chew 1 tablet by mouth at bedtime.     Magnesium 250 MG TABS Take 250 mg by mouth daily.  Multiple Vitamin (MULTI VITAMIN) TABS Take 1 tablet by mouth daily. Centrum Silver     nortriptyline (PAMELOR) 10 MG capsule Take 2 capsules (20 mg total) by mouth at bedtime. (Patient taking differently: Take 10 mg by mouth at bedtime.) 60 capsule 5   oxyCODONE-acetaminophen (PERCOCET/ROXICET) 5-325 MG tablet Take 1 tablet by mouth every 6 (six) hours as needed. 12 tablet 0   rosuvastatin (CRESTOR) 40 MG tablet Take 1 tablet (40 mg total) by mouth daily. 90 tablet 3   Study - OCEANIC-STROKE - asundexian 50 mg or placebo tablet (PI-Sethi) Take 1 tablet (50 mg total) by mouth daily. For Investigational Use  Only. Take at the same time each day (preferably in the morning). Tablet should be swallowed whole with water; it CANNOT be crushed or broken. Please contact [facility] for any questions or concerns regarding this medication. 98 tablet 0   triamcinolone cream (KENALOG) 0.1 % Apply 1 Application topically daily as needed (For dermatitis).     TURMERIC CURCUMIN PO Take 2,000 mg by mouth daily as needed.     No current facility-administered medications on file prior to visit.    ALLERGIES: No Known Allergies  FAMILY HISTORY: Family History  Problem Relation Age of Onset   Heart attack Mother    Heart disease Mother    Heart disease Father    Stroke Father       Objective:  *** General: No acute distress.  Patient appears well-groomed.   Head:  Normocephalic/atraumatic Eyes:  Fundi examined but not visualized Neck: supple, no paraspinal tenderness, full range of motion Heart:  Regular rate and rhythm Neurological Exam: alert and oriented to person, place, and time.  Speech fluent and not dysarthric, language intact.  CN II-XII intact. Bulk and tone normal, muscle strength 5/5 throughout.  Sensation to vibration reduced in right first toe.  Deep tendon reflexes 2+ throughout.  Finger to nose testing intact.  Gait normal, Romberg negative.   Metta Clines, DO  CC: Shirline Frees, MD

## 2022-02-09 ENCOUNTER — Ambulatory Visit: Payer: Medicare HMO | Admitting: Neurology

## 2022-02-10 NOTE — Progress Notes (Deleted)
NEUROLOGY FOLLOW UP OFFICE NOTE  Ryan Franklin [id number]  Assessment/Plan:   Scattered small ischemic infarcts in the right MCA territory secondary to unknown embolic source Hypertension Hyperlipidemia Headache stable  1  Secondary stroke prevention as managed by PCP:  - Plavix 75mg  daily  - Rosuvastatin.  LDL goal less than 70  - Normotensive blood pressure  - Hgb A1c goal less than 7 2  Implantable loop recorder 3  Continue nortriptyline 10mg  at bedtime 4  Follow up with Dr. regarding the [facility] Trial.   5  Follow up in ***  Subjective:  Ryan Franklin is a 71 year old right-handed male with HTN, HLD and PUD with history of GI bleed and DVT whom I saw for headache presents today for recent stroke.  CT/CTA head and neck and MRI brain personally reviewed.  On 09/26/2021, he had episode of lightheadedness with left leg numbness lasting 45 minutes.  Several days prior, he reported brief episode of left arm incoordination.  CT head showed no acute abnormality but follow up MRI revealed numerous scattered small acute infarcts in the right MCA territory.  CTA of head and neck showed atherosclerotic changes at the carotid bifurcations but no large vessel occlusion or hemodynamically significant stenosis.  2D echo with bubble study showed EF 60-65% with no interatrial shunt.  LDL was 107 and Hgb A1c was 5.4.  LE venous doppler showed evidence of known old DVT in left popliteal but no acute DVT.  He was on ASA 81mg  prior to admission and discharged on ASA 81mg  and Plavix 75mg  daily for 21 days followed by Plavix monotherapy.  Home rosuvastatin was increased from 10mg  to 20mg  daily.  He also entered the [facility] trial (on asundexian vs placebo).  He was discharged and had an outpatient TEE on 6/22 which revealed calcification of the aortic valve but no significant valve disease, no thrombus, PFO or other cardiac source of emboli.  Since discharge, he has been feeling well.   This morning in the shower, he had a new pruritic rash which has since resolved.  Denies history of palpitations.  Of note, no headaches since decreasing nortriptyline.       PAST MEDICAL HISTORY: Past Medical History:  Diagnosis Date   Arthritis    DVT (deep venous thrombosis) (HCC)    left leg   Hyperlipidemia    Hypertension    Peripheral vascular disease (HCC)    Stroke [facility])     MEDICATIONS: Current Outpatient Medications on File Prior to Visit  Medication Sig Dispense Refill   amLODipine (NORVASC) 5 MG tablet Take 1 tablet (5 mg total) by mouth daily for 30 days. (Patient taking differently: Take 5 mg by mouth at bedtime.) 30 tablet 0   Ascorbic Acid (VITAMIN C) 1000 MG tablet Take 1,000 mg by mouth daily.     aspirin EC 81 MG tablet Take 81 mg by mouth daily. Swallow whole.     Bioflavonoid Products (BIOFLEX) TABS Take 2 tablets by mouth daily.     cholecalciferol (VITAMIN D3) 25 MCG (1000 UNIT) tablet Take 1,000 Units by mouth daily.     clopidogrel (PLAVIX) 75 MG tablet Take 1 tablet (75 mg total) by mouth daily. (Patient taking differently: Take 75 mg by mouth at bedtime.) 30 tablet 2   famotidine-calcium carbonate-magnesium hydroxide (PEPCID COMPLETE) 10-800-165 MG chewable tablet Chew 1 tablet by mouth at bedtime.     Magnesium 250 MG TABS Take 250 mg by mouth daily.  Multiple Vitamin (MULTI VITAMIN) TABS Take 1 tablet by mouth daily. Centrum Silver     nortriptyline (PAMELOR) 10 MG capsule Take 2 capsules (20 mg total) by mouth at bedtime. (Patient taking differently: Take 10 mg by mouth at bedtime.) 60 capsule 5   oxyCODONE-acetaminophen (PERCOCET/ROXICET) 5-325 MG tablet Take 1 tablet by mouth every 6 (six) hours as needed. 12 tablet 0   rosuvastatin (CRESTOR) 40 MG tablet Take 1 tablet (40 mg total) by mouth daily. 90 tablet 3   Study - OCEANIC-STROKE - asundexian 50 mg or placebo tablet (PI-Sethi) Take 1 tablet (50 mg total) by mouth daily. For Investigational Use  Only. Take at the same time each day (preferably in the morning). Tablet should be swallowed whole with water; it CANNOT be crushed or broken. Please contact [facility] for any questions or concerns regarding this medication. 98 tablet 0   triamcinolone cream (KENALOG) 0.1 % Apply 1 Application topically daily as needed (For dermatitis).     TURMERIC CURCUMIN PO Take 2,000 mg by mouth daily as needed.     No current facility-administered medications on file prior to visit.    ALLERGIES: No Known Allergies  FAMILY HISTORY: Family History  Problem Relation Age of Onset   Heart attack Mother    Heart disease Mother    Heart disease Father    Stroke Father       Objective:  *** General: No acute distress.  Patient appears well-groomed.   Head:  Normocephalic/atraumatic Eyes:  Fundi examined but not visualized Neck: supple, no paraspinal tenderness, full range of motion Heart:  Regular rate and rhythm Neurological Exam: alert and oriented to person, place, and time.  Speech fluent and not dysarthric, language intact.  CN II-XII intact. Bulk and tone normal, muscle strength 5/5 throughout.  Sensation to vibration reduced in right first toe.  Deep tendon reflexes 2+ throughout.  Finger to nose testing intact.  Gait normal, Romberg negative.   Ryan Clines, Ryan Franklin  CC: Ryan Frees, Ryan Franklin

## 2022-02-14 ENCOUNTER — Ambulatory Visit: Payer: Medicare HMO | Admitting: Neurology

## 2022-02-15 DIAGNOSIS — E7800 Pure hypercholesterolemia, unspecified: Secondary | ICD-10-CM | POA: Diagnosis not present

## 2022-02-15 DIAGNOSIS — I10 Essential (primary) hypertension: Secondary | ICD-10-CM | POA: Diagnosis not present

## 2022-02-15 DIAGNOSIS — Z0000 Encounter for general adult medical examination without abnormal findings: Secondary | ICD-10-CM | POA: Diagnosis not present

## 2022-02-15 DIAGNOSIS — Z125 Encounter for screening for malignant neoplasm of prostate: Secondary | ICD-10-CM | POA: Diagnosis not present

## 2022-02-15 DIAGNOSIS — I679 Cerebrovascular disease, unspecified: Secondary | ICD-10-CM | POA: Diagnosis not present

## 2022-02-15 DIAGNOSIS — N528 Other male erectile dysfunction: Secondary | ICD-10-CM | POA: Diagnosis not present

## 2022-02-15 DIAGNOSIS — K219 Gastro-esophageal reflux disease without esophagitis: Secondary | ICD-10-CM | POA: Diagnosis not present

## 2022-02-15 DIAGNOSIS — L309 Dermatitis, unspecified: Secondary | ICD-10-CM | POA: Diagnosis not present

## 2022-02-15 DIAGNOSIS — Z23 Encounter for immunization: Secondary | ICD-10-CM | POA: Diagnosis not present

## 2022-02-15 DIAGNOSIS — G4489 Other headache syndrome: Secondary | ICD-10-CM | POA: Diagnosis not present

## 2022-02-15 DIAGNOSIS — I6521 Occlusion and stenosis of right carotid artery: Secondary | ICD-10-CM | POA: Diagnosis not present

## 2022-05-06 ENCOUNTER — Telehealth: Payer: Self-pay | Admitting: Vascular Surgery

## 2022-05-06 NOTE — Telephone Encounter (Signed)
-----  Message from Marty Heck, MD sent at 05/05/2022  5:46 PM EST ----- Can you get this patient scheduled for a left leg DVT study?  Does not need to see a provider.  He is just concerned that he may have a DVT based on his symptoms.  I can call him if any concerning findings.  Thanks,  Gerald Stabs

## 2022-05-09 ENCOUNTER — Other Ambulatory Visit: Payer: Self-pay | Admitting: *Deleted

## 2022-05-09 DIAGNOSIS — M79605 Pain in left leg: Secondary | ICD-10-CM

## 2022-05-10 NOTE — Progress Notes (Unsigned)
NEUROLOGY FOLLOW UP OFFICE NOTE  Ryan Franklin [id number]  Assessment/Plan:   Scattered small ischemic infarcts in the right MCA territory.  Cardiology believed it was secondary to right ICA stenosis, now s/p stent. Headache, nonspecific, not intractable - with morning headaches and daytime fatigue, consider OSA Hypertension Hyperlipidemia   1  Discontinue ASA.  Continue Plavix 75mg  daily 2  Rosuvastatin 20mg  daily.  LDL goal less than 70 3  Normotensive blood pressure 4  Hgb A1c goal less than 7 5  Refer to sleep medicine for evaluation of sleep apnea. 6  Continue nortriptyline 10mg  at bedtime 7  Follow up with Dr. Leonie Man regarding the [facility] Trial.   8  Follow up 6 months.  Subjective:  Ryan Franklin is a 72 year old right-handed male with HTN, HLD and PUD with history of GI bleed and DVT follows up for headache and stroke.  UPDATE: Current medications:  Plavix 75mg  daily, rosuvastatin 20mg  daily, nortriptyline 10mg  QHS  Feels well.  Headaches are still daily but no dull.  Occurs mainly when he wakes up in the morning.  May return again later in the day.  Feels tired during the day.  Sleep is poor.  Evaluated by cardiology.  He did not have an implantable loop recorder.  Believed stroke was related to the ICA stenosis.  Underwent stent in August.    LDL from August was 82.  HISTORY: Stroke: On 09/26/2021, he had episode of lightheadedness with left leg numbness lasting 45 minutes.  Several days prior, he reported brief episode of left arm incoordination.  CT head showed no acute abnormality but follow up MRI revealed numerous scattered small acute infarcts in the right MCA territory.  CTA of head and neck showed atherosclerotic changes at the carotid bifurcations but no large vessel occlusion or hemodynamically significant stenosis.  2D echo with bubble study showed EF 60-65% with no interatrial shunt.  LDL was 107 and Hgb A1c was 5.4.  LE venous doppler showed  evidence of known old DVT in left popliteal but no acute DVT.  He was on ASA 81mg  prior to admission and discharged on ASA 81mg  and Plavix 75mg  daily for 21 days followed by Plavix monotherapy.  Home rosuvastatin was increased from 10mg  to 20mg  daily.  He also entered the [facility] trial (on asundexian vs placebo).  He was discharged and had an outpatient TEE on 6/22 which revealed calcification of the aortic valve but no significant valve disease, no thrombus, PFO or other cardiac source of emboli.  Since discharge, he has been feeling well.  This morning in the shower, he had a new pruritic rash which has since resolved.  Denies history of palpitations.  Headache: He began having new daily near-persistent headaches in early 2021.  They are usually a severe right-sided non-throbbing pain.  They sometimes wake him up in the middle of the night.  Sometimes he is bed-bound.  On rare occasions, he has noted brief flashes in his left eye.  There is no associated nausea, vomiting, photophobia, phonophobia, osmophobia, speech disturbance, dizziness, numbness or weakness.  He has some neck tightness.  He was initially taking ibuprofen and ASA daily, but later was changed to tramadol, which he rarely takes (only if needed) but does seem to dull the pain.  No specific triggers.   Sed rate on 10/07/2019 was 13.  MRI of brain with and without contrast on 12/10/2019 showed abnormal signal within the dorsal midbrain and pons and cerebellum, suspicious for  toxic/metabolic disorders such as Wernicke encephalopathy.  MRA of head was normal.  Denied heavy alcohol use.  Vitamin B1 level was 7 and he was advised to start thiamine 100mg  daily.  Follow up MRI of brain with and without contrast on 05/08/2020 showed no evidence of signal abnormality suggesting that the previous findings were likely artifactual.    He does have a history of different dull frontal headaches and ocular migraines (kaleidoscopic vision, no  headache).  Past NSAIDS/steroid:  ASA, ibuprofen, prednisone (can't take nsaids now due to Eliquis) Past analgesics:  tramadol Past abortive triptans:  none Past abortive ergotamine:  none Past muscle relaxants:  none Past anti-emetic:  none Past antihypertensive medications:  none Past antidepressant medications:  none Past anticonvulsant medications:  none Past anti-CGRP:  Ubrelvy (ineffective) Past vitamins/Herbal/Supplements:  none Past antihistamines/decongestants:  none Other past therapies:  none   PAST MEDICAL HISTORY: Past Medical History:  Diagnosis Date   Hyperlipidemia    Hypertension     MEDICATIONS: Current Outpatient Medications on File Prior to Visit  Medication Sig Dispense Refill   Ascorbic Acid (VITAMIN C) 1000 MG tablet Take 1,000 mg by mouth daily.     Bioflavonoid Products (BIOFLEX) TABS Take 2 tablets by mouth daily.     cholecalciferol (VITAMIN D3) 25 MCG (1000 UNIT) tablet Take 1,000 Units by mouth daily.     clopidogrel (PLAVIX) 75 MG tablet Take 1 tablet (75 mg total) by mouth daily. (Patient taking differently: Take 75 mg by mouth at bedtime.) 30 tablet 2   famotidine-calcium carbonate-magnesium hydroxide (PEPCID COMPLETE) 10-800-165 MG chewable tablet Chew 1 tablet by mouth at bedtime.     Magnesium 250 MG TABS Take 250 mg by mouth daily.     Multiple Vitamin (MULTI VITAMIN) TABS Take 1 tablet by mouth daily. Centrum Silver     nortriptyline (PAMELOR) 10 MG capsule Take 2 capsules (20 mg total) by mouth at bedtime. (Patient taking differently: Take 10 mg by mouth at bedtime.) 60 capsule 5   rosuvastatin (CRESTOR) 40 MG tablet Take 1 tablet (40 mg total) by mouth daily. 90 tablet 3   Study - OCEANIC-STROKE - asundexian 50 mg or placebo tablet (PI-Sethi) Take 1 tablet (50 mg total) by mouth daily. For Investigational Use Only. Take at the same time each day (preferably in the morning). Tablet should be swallowed whole with water; it CANNOT be crushed or  broken. Please contact [facility] for any questions or concerns regarding this medication. 98 tablet 0   amLODipine (NORVASC) 5 MG tablet Take 1 tablet (5 mg total) by mouth daily for 30 days. (Patient taking differently: Take 5 mg by mouth at bedtime.) 30 tablet 0   No current facility-administered medications on file prior to visit.     ALLERGIES: No Known Allergies  FAMILY HISTORY: No family history on file.    Objective:  Blood pressure 139/89, pulse 77, height 5\' 10"  (1.778 m), weight 212 lb (96.2 kg), SpO2 98 %. General: No acute distress.  Patient appears well-groomed.   Head:  Normocephalic/atraumatic Eyes:  Fundi examined but not visualized Neck: supple, no paraspinal tenderness, full range of motion Heart:  Regular rate and rhythm Neurological Exam: alert and oriented to person, place, and time.  Speech fluent and not dysarthric, language intact.  CN II-XII intact. Bulk and tone normal, muscle strength 5/5 throughout.  Sensation to light touch intact.  Deep tendon reflexes 2+ throughout.  Finger to nose testing intact.  Gait normal, Romberg negative.  Metta Clines, DO  CC: Shirline Frees, MD

## 2022-05-11 ENCOUNTER — Encounter: Payer: Self-pay | Admitting: Neurology

## 2022-05-11 ENCOUNTER — Ambulatory Visit: Payer: Medicare HMO | Admitting: Neurology

## 2022-05-11 VITALS — BP 139/89 | HR 77 | Ht 70.0 in | Wt 212.0 lb

## 2022-05-11 DIAGNOSIS — R519 Headache, unspecified: Secondary | ICD-10-CM | POA: Diagnosis not present

## 2022-05-11 DIAGNOSIS — I10 Essential (primary) hypertension: Secondary | ICD-10-CM | POA: Diagnosis not present

## 2022-05-11 DIAGNOSIS — E785 Hyperlipidemia, unspecified: Secondary | ICD-10-CM | POA: Diagnosis not present

## 2022-05-11 DIAGNOSIS — R5383 Other fatigue: Secondary | ICD-10-CM | POA: Diagnosis not present

## 2022-05-11 DIAGNOSIS — I6310 Cerebral infarction due to embolism of unspecified precerebral artery: Secondary | ICD-10-CM | POA: Diagnosis not present

## 2022-05-11 NOTE — Patient Instructions (Addendum)
Continue Plavix 75mg  daily.  STOP ASPIRIN Continue rosuvastatin and amlodipine Continue nortriptyline 10mg  at bedtime Refer to sleep medicine for evaluation of sleep apnea.  Contact me if you don't get a call in a week. Follow up 6 months.

## 2022-05-12 ENCOUNTER — Ambulatory Visit (HOSPITAL_COMMUNITY)
Admission: RE | Admit: 2022-05-12 | Discharge: 2022-05-12 | Disposition: A | Discharge status: Home / Self Care | Payer: Medicare HMO | Source: Ambulatory Visit | Attending: Vascular Surgery | Admitting: Vascular Surgery

## 2022-05-12 DIAGNOSIS — M79605 Pain in left leg: Secondary | ICD-10-CM | POA: Diagnosis not present

## 2022-06-04 NOTE — Progress Notes (Unsigned)
06/06/22- 71 yoM former smoker for sleep evaluation courtesy of dr Tomi Likens with concern of Headache Medical problem list includes HTN, Carotid Stenosis, CVA(MCA stroke), DVT, GERD, Hyperlipidemia,  Epworth score- Body weight today- Covid vax- Flu vax-

## 2022-06-06 ENCOUNTER — Encounter: Payer: Self-pay | Admitting: Internal Medicine

## 2022-06-06 ENCOUNTER — Ambulatory Visit: Payer: Medicare HMO | Admitting: Internal Medicine

## 2022-06-06 VITALS — BP 130/84 | HR 79 | Ht 70.0 in | Wt 217.2 lb

## 2022-06-06 DIAGNOSIS — R400 Somnolence: Secondary | ICD-10-CM | POA: Diagnosis not present

## 2022-06-06 DIAGNOSIS — R0683 Snoring: Secondary | ICD-10-CM

## 2022-06-06 DIAGNOSIS — G4733 Obstructive sleep apnea (adult) (pediatric): Secondary | ICD-10-CM | POA: Insufficient documentation

## 2022-06-06 DIAGNOSIS — I63511 Cerebral infarction due to unspecified occlusion or stenosis of right middle cerebral artery: Secondary | ICD-10-CM

## 2022-06-06 NOTE — Assessment & Plan Note (Signed)
Followed by neurology.   

## 2022-06-06 NOTE — Patient Instructions (Signed)
Order- schedule home sleep test   dxx snoring, headache  Please call us 2 weeks after your tet for results

## 2022-06-06 NOTE — Assessment & Plan Note (Signed)
Nonrestorative sleep with daytime somnolence, morning headache, family history of OSA.  Suspect OSA.  Appropriate discussion done, explanations given and questions answered. Plan-schedule sleep study

## 2022-07-17 DIAGNOSIS — G4730 Sleep apnea, unspecified: Secondary | ICD-10-CM | POA: Diagnosis not present

## 2022-07-27 ENCOUNTER — Ambulatory Visit (INDEPENDENT_AMBULATORY_CARE_PROVIDER_SITE_OTHER): Payer: Medicare HMO

## 2022-07-27 DIAGNOSIS — G4733 Obstructive sleep apnea (adult) (pediatric): Secondary | ICD-10-CM | POA: Diagnosis not present

## 2022-07-27 DIAGNOSIS — R0683 Snoring: Secondary | ICD-10-CM

## 2022-08-17 DIAGNOSIS — I679 Cerebrovascular disease, unspecified: Secondary | ICD-10-CM | POA: Diagnosis not present

## 2022-08-17 DIAGNOSIS — E7800 Pure hypercholesterolemia, unspecified: Secondary | ICD-10-CM | POA: Diagnosis not present

## 2022-08-17 DIAGNOSIS — I10 Essential (primary) hypertension: Secondary | ICD-10-CM | POA: Diagnosis not present

## 2022-08-17 DIAGNOSIS — L309 Dermatitis, unspecified: Secondary | ICD-10-CM | POA: Diagnosis not present

## 2022-08-17 DIAGNOSIS — G4489 Other headache syndrome: Secondary | ICD-10-CM | POA: Diagnosis not present

## 2022-08-17 DIAGNOSIS — K219 Gastro-esophageal reflux disease without esophagitis: Secondary | ICD-10-CM | POA: Diagnosis not present

## 2022-09-05 NOTE — Progress Notes (Unsigned)
06/06/22- 71 yoM former smoker for sleep evaluation courtesy of Dr Everlena Cooper with concern of Headache Medical problem list includes HTN, Carotid Stenosis, CVA(MCA stroke), DVT, GERD, Hyperlipidemia,  Epworth score-12 Body weight today-217 lbs Covid vax- 4 Phizer Flu vax-had No use of cpap machine, never had a sleep study -----Patient states he wakes up throughout the night, doesn't ever feel rested and is always tired, most morning wakes up with a headache  Lives alone but aware of snoring.  Brother has obstructive sleep apnea and probably grandfather as well. Primary concern is morning headache.  Often some daytime tiredness.  Works from home and is able to nap occasionally. Tried melatonin at night to consolidate sleep but says it made him groggy.  Drugs other OTC product.  3 cups morning coffee, none after that.  1 glass of wine at night.  Goes to a gym 3 days a week for which she is up at 5 AM and goes to bed those nights at 9 PM.  Otherwise bedtime around 10 PM and gets up around 7 AM. ENT surgery for tonsils as a child.  No active cardiopulmonary problems.  Did require right carotid stent after CVA.  09/06/22- 71yoM former smoker followed for OSA, complicated by Headache, HTN, Carotid Stenosis, CVA(MCA stroke), DVT, GERD, Hyperlipidemia,  HST 07/17/22- AHI 32.2/ hr, desaturation to 67%, body weight 217 lbs Body weight today- For treatment decision We discussed his sleep study results and treatment options.  He is strongly intolerant of anything confining and is concerned that a CPAP mask might be similar.  We are going to refer him for both CPAP and for consideration of an oral appliance so that he can decide.  Ultimately he may be a good [facility] candidate.  ROS-see HPI    + = positive Constitutional:    weight loss, night sweats, fevers, chills, fatigue, lassitude. HEENT:    headaches, difficulty swallowing, tooth/dental problems, sore throat,       sneezing, itching, ear ache, nasal congestion,  post nasal drip, snoring CV:    chest pain, orthopnea, PND, swelling in lower extremities, anasarca,                                   dizziness, palpitations Resp:   shortness of breath with exertion or at rest.                productive cough,   non-productive cough, coughing up of blood.              change in color of mucus.  wheezing.   Skin:    rash or lesions. GI:  No-   heartburn, indigestion, abdominal pain, nausea, vomiting, diarrhea,                 change in bowel habits, loss of appetite GU: dysuria, change in color of urine, no urgency or frequency.   flank pain. MS:   joint pain, stiffness, decreased range of motion, back pain. Neuro-     nothing unusual Psych:  change in mood or affect.  depression or anxiety.   memory loss.  OBJ- Physical Exam General- Alert, Oriented, Affect-appropriate, Distress- none acute Skin- rash-none, lesions- none, excoriation- none Lymphadenopathy- none Head- atraumatic            Eyes- Gross vision intact, PERRLA, conjunctivae and secretions clear            Ears- Hearing,  canals-normal            Nose- Clear, no-Septal dev, mucus, polyps, erosion, perforation             Throat- Mallampati III-IV , mucosa clear , drainage- none, tonsils- atrophic, + teeth Neck- flexible , trachea midline, no stridor , thyroid nl, carotid no bruit Chest - symmetrical excursion , unlabored           Heart/CV- RRR , no murmur , no gallop  , no rub, nl s1 s2                           - JVD- none , edema- none, stasis changes- none, varices- none           Lung- clear to P&A, wheeze- none, cough- none , dullness-none, rub- none           Chest wall-  Abd-  Br/ Gen/ Rectal- Not done, not indicated Extrem- cyanosis- none, clubbing, none, atrophy- none, strength- nl Neuro- grossly intact to observation

## 2022-09-06 ENCOUNTER — Ambulatory Visit: Payer: Medicare HMO | Admitting: Internal Medicine

## 2022-09-06 ENCOUNTER — Encounter: Payer: Self-pay | Admitting: Internal Medicine

## 2022-09-06 VITALS — BP 124/70 | HR 63 | Ht 70.0 in | Wt 213.2 lb

## 2022-09-06 DIAGNOSIS — G4733 Obstructive sleep apnea (adult) (pediatric): Secondary | ICD-10-CM

## 2022-09-06 DIAGNOSIS — R519 Headache, unspecified: Secondary | ICD-10-CM | POA: Diagnosis not present

## 2022-09-06 DIAGNOSIS — G8929 Other chronic pain: Secondary | ICD-10-CM

## 2022-09-06 NOTE — Assessment & Plan Note (Signed)
Hopefully addressing OSA may help this.

## 2022-09-06 NOTE — Assessment & Plan Note (Signed)
He is considering options, but is concerned he may not tolerate CPAP. Plan- referral for CPAP auto 5-20 and also to consider oral appliance

## 2022-09-06 NOTE — Patient Instructions (Signed)
Order- new DME, new CPAP auto 5-20, mask of choice, humidifier, supplies, AirView/ card  Order- referral to Dr Althea Grimmer, orthodontist    consider oral appliance for OSA

## 2022-09-10 ENCOUNTER — Other Ambulatory Visit: Payer: Self-pay | Admitting: Vascular Surgery

## 2022-09-27 ENCOUNTER — Ambulatory Visit: Payer: Medicare HMO | Admitting: Vascular Surgery

## 2022-09-27 ENCOUNTER — Encounter: Payer: Self-pay | Admitting: Vascular Surgery

## 2022-09-27 ENCOUNTER — Ambulatory Visit (HOSPITAL_COMMUNITY)
Admission: RE | Admit: 2022-09-27 | Discharge: 2022-09-27 | Disposition: A | Discharge status: Home / Self Care | Payer: Medicare HMO | Source: Ambulatory Visit | Attending: Vascular Surgery | Admitting: Vascular Surgery

## 2022-09-27 VITALS — BP 138/74 | HR 71 | Temp 97.6°F | Resp 18 | Ht 70.0 in | Wt 209.0 lb

## 2022-09-27 DIAGNOSIS — H5203 Hypermetropia, bilateral: Secondary | ICD-10-CM | POA: Diagnosis not present

## 2022-09-27 DIAGNOSIS — I6521 Occlusion and stenosis of right carotid artery: Secondary | ICD-10-CM | POA: Diagnosis not present

## 2022-09-27 DIAGNOSIS — I6529 Occlusion and stenosis of unspecified carotid artery: Secondary | ICD-10-CM | POA: Insufficient documentation

## 2022-09-27 DIAGNOSIS — H52223 Regular astigmatism, bilateral: Secondary | ICD-10-CM | POA: Diagnosis not present

## 2022-09-27 DIAGNOSIS — H524 Presbyopia: Secondary | ICD-10-CM | POA: Diagnosis not present

## 2022-09-27 NOTE — Progress Notes (Signed)
Patient name: Ryan Franklin MRN: [id number] DOB: 1950-08-06 Sex: male  REASON FOR VISIT: 9 month follow-up, carotid surveilllance  HPI: Ryan Franklin is a 72 y.o. male with hx HTN and HLD who presents for 9 month follow-up of his carotid artery disease.  He had a symptomatic 60% right ICA stenosis with ulcerated plaque and underwent right TCAR on 11/22/2021.  He did well postoperatively.    He reports no neurologic events since last evaluation.  He remains on aspirin Plavix statin for his carotid stent.  He is also in the oceanic stroke study with neurology followed by Dr. Pearlean Brownie.   Past Medical History:  Diagnosis Date   Arthritis    DVT (deep venous thrombosis) (HCC)    left leg   Hyperlipidemia    Hypertension    Peripheral vascular disease (HCC)    Stroke [facility])     Past Surgical History:  Procedure Laterality Date   BUBBLE STUDY  09/30/2021   Procedure: BUBBLE STUDY;  Surgeon: Chrystie Nose, MD;  Location: [facility] ENDOSCOPY;  Service: Cardiovascular;;   HERNIA REPAIR     TEE WITHOUT CARDIOVERSION N/A 09/30/2021   Procedure: TRANSESOPHAGEAL ECHOCARDIOGRAM (TEE);  Surgeon: Chrystie Nose, MD;  Location: [facility] ENDOSCOPY;  Service: Cardiovascular;  Laterality: N/A;   TRANSCAROTID ARTERY REVASCULARIZATION  Right 11/22/2021   Procedure: RIGHT TRANSCAROTID ARTERY REVASCULARIZATION;  Surgeon: Cephus Shelling, MD;  Location: [facility] OR;  Service: Vascular;  Laterality: Right;   ULTRASOUND GUIDANCE FOR VASCULAR ACCESS Left 11/22/2021   Procedure: ULTRASOUND GUIDANCE FOR VASCULAR ACCESS;  Surgeon: Cephus Shelling, MD;  Location: [facility] OR;  Service: Vascular;  Laterality: Left;    Family History  Problem Relation Age of Onset   Heart attack Mother    Heart disease Mother    Heart disease Father    Stroke Father     SOCIAL HISTORY: Social History   Tobacco Use   Smoking status: Former    Years: 10    Types: Cigarettes    Quit date: 04/1986    Years since quitting: 36.4    Smokeless tobacco: Never  Substance Use Topics   Alcohol use: Yes    Alcohol/week: 7.0 standard drinks of alcohol    Types: 7 Glasses of wine per week    No Known Allergies  Current Outpatient Medications  Medication Sig Dispense Refill   amLODipine (NORVASC) 5 MG tablet Take 1 tablet (5 mg total) by mouth daily for 30 days. (Patient taking differently: Take 5 mg by mouth at bedtime.) 30 tablet 0   Ascorbic Acid (VITAMIN C) 1000 MG tablet Take 1,000 mg by mouth daily.     aspirin EC 81 MG tablet 1 tablet Orally Once a day     cholecalciferol (VITAMIN D3) 25 MCG (1000 UNIT) tablet Take 1,000 Units by mouth daily.     clopidogrel (PLAVIX) 75 MG tablet Take 1 tablet (75 mg total) by mouth daily. (Patient taking differently: Take 75 mg by mouth at bedtime.) 30 tablet 2   famotidine-calcium carbonate-magnesium hydroxide (PEPCID COMPLETE) 10-800-165 MG chewable tablet Chew 1 tablet by mouth at bedtime.     Magnesium 250 MG TABS Take 250 mg by mouth daily.     Multiple Vitamin (MULTI VITAMIN) TABS Take 1 tablet by mouth daily. Centrum Silver     nortriptyline (PAMELOR) 10 MG capsule Take 2 capsules (20 mg total) by mouth at bedtime. (Patient taking differently: Take 10 mg by mouth at bedtime.) 60 capsule 5  rosuvastatin (CRESTOR) 40 MG tablet TAKE 1 TABLET(40 MG) BY MOUTH DAILY 30 tablet 0   Study - OCEANIC-STROKE - asundexian 50 mg or placebo tablet (PI-Sethi) Take 1 tablet (50 mg total) by mouth daily. For Investigational Use Only. Take at the same time each day (preferably in the morning). Tablet should be swallowed whole with water; it CANNOT be crushed or broken. Please contact [facility] for any questions or concerns regarding this medication. 98 tablet 0   No current facility-administered medications for this visit.    REVIEW OF SYSTEMS:  [X]  denotes positive finding, [ ]  denotes negative finding Cardiac  Comments:  Chest pain or chest pressure:    Shortness of breath  upon exertion:    Short of breath when lying flat:    Irregular heart rhythm:        Vascular    Pain in calf, thigh, or hip brought on by ambulation:    Pain in feet at night that wakes you up from your sleep:     Blood clot in your veins:    Leg swelling:         Pulmonary    Oxygen at home:    Productive cough:     Wheezing:         Neurologic    Sudden weakness in arms or legs:     Sudden numbness in arms or legs:     Sudden onset of difficulty speaking or slurred speech:    Temporary loss of vision in one eye:     Problems with dizziness:         Gastrointestinal    Blood in stool:     Vomited blood:         Genitourinary    Burning when urinating:     Blood in urine:        Psychiatric    Major depression:         Hematologic    Bleeding problems:    Problems with blood clotting too easily:        Skin    Rashes or ulcers:        Constitutional    Fever or chills:      PHYSICAL EXAM: Vitals:   09/27/22 1431 09/27/22 1433  BP: 132/72 138/74  Pulse: 71 71  Resp: 18   Temp: 97.6 F (36.4 C)   TempSrc: Temporal   SpO2: 97%   Weight: 209 lb (94.8 kg)   Height: 5\' 10"  (1.778 m)     GENERAL: The patient is a well-nourished male, in no acute distress. The vital signs are documented above. CARDIAC: There is a regular rate and rhythm.  VASCULAR:  Right neck incision healed NEUROLOGIC: No focal weakness or paresthesias are detected.  Cranial nerves II through XII grossly intact. SKIN: There are no ulcers or rashes noted. PSYCHIATRIC: The patient has a normal affect.  DATA:   Carotid duplex today shows widely patent right carotid stent with minimal stenosis in the left ICA  Assessment/Plan:  72 y.o. male who presents for 9 month follow-up and surveillance after right TCAR.  He had a symptomatic 60% right ICA stenosis with ulcerated plaque and underwent right TCAR on 11/22/2021.  Discussed he has a widely patent right carotid stent.  He has minimal  carotid disease on the left side that does not require any intervention.  He should remain on aspirin Plavix statin for risk reduction.  He can certainly come off his Plavix  for other procedures from my standpoint if needed.  I will see him in 1 year with repeat carotid duplex for ongoing surveillance.  Discussed he call with questions or concerns.  Cephus Shelling, MD [facility] Office: [phone]

## 2022-10-08 ENCOUNTER — Other Ambulatory Visit: Payer: Self-pay

## 2022-10-08 DIAGNOSIS — I6529 Occlusion and stenosis of unspecified carotid artery: Secondary | ICD-10-CM

## 2022-11-08 NOTE — Progress Notes (Deleted)
NEUROLOGY FOLLOW UP OFFICE NOTE  Ryan Franklin [id number]  Assessment/Plan:   Scattered small ischemic infarcts in the right MCA territory.   Right ICA stenosis status post stent Headache, nonspecific, not intractable - may be secondary to OSA Obstructive sleep apnea Hypertension Hyperlipidemia   Headache prevention:  nortriptyline 10mg  at bedtime. Limit use of pain relievers to no more than 2 days out of week to prevent risk of rebound or medication-overuse headache. Secondary stroke prevention as managed by PCP: ASA 81mg  and daily and Plavix 75mg  daily Statin.  LDL goal less than 70 Normotensive blood pressure Hgb A1c goal less than 7 Sleep *** Follow up with Dr. Pearlean Brownie regarding the [facility] Trial Follow up ***     Subjective:  Ryan Franklin is a 72 year old right-handed male with HTN, HLD and PUD with history of GI bleed and DVT follows up for headache and stroke.  UPDATE: Current medications:  Plavix 75mg  daily, rosuvastatin 20mg  daily, nortriptyline 10mg  QHS  Evaluated by sleep medicine.  Diagnosed with OSA.  Considering CPAP but ***  Headaches are still daily but no dull.  Occurs mainly when he wakes up in the morning.  May return again later in the day.  Feels tired during the day.  Sleep is poor. ***   HISTORY: Stroke: On 09/26/2021, he had episode of lightheadedness with left leg numbness lasting 45 minutes.  Several days prior, he reported brief episode of left arm incoordination.  CT head showed no acute abnormality but follow up MRI revealed numerous scattered small acute infarcts in the right MCA territory.  CTA of head and neck showed atherosclerotic changes at the carotid bifurcations but no large vessel occlusion or hemodynamically significant stenosis.  2D echo with bubble study showed EF 60-65% with no interatrial shunt.  LDL was 107 and Hgb A1c was 5.4.  LE venous doppler showed evidence of known old DVT in left popliteal but no acute DVT.   He was on ASA 81mg  prior to admission and discharged on ASA 81mg  and Plavix 75mg  daily for 21 days followed by Plavix monotherapy.  Home rosuvastatin was increased from 10mg  to 20mg  daily.  He also entered the [facility] trial (on asundexian vs placebo).  He was discharged and had an outpatient TEE on 6/22 which revealed calcification of the aortic valve but no significant valve disease, no thrombus, PFO or other cardiac source of emboli.  Evaluated by cardiology.  He did not have an implantable loop recorder.  Believed stroke was related to the ICA stenosis.  Underwent stent in August 2023.  Denies history of palpitations.  Headache: He began having new daily near-persistent headaches in early 2021.  They are usually a severe right-sided non-throbbing pain.  They sometimes wake him up in the middle of the night.  Sometimes he is bed-bound.  On rare occasions, he has noted brief flashes in his left eye.  There is no associated nausea, vomiting, photophobia, phonophobia, osmophobia, speech disturbance, dizziness, numbness or weakness.  He has some neck tightness.  He was initially taking ibuprofen and ASA daily, but later was changed to tramadol, which he rarely takes (only if needed) but does seem to dull the pain.  No specific triggers.   Sed rate on 10/07/2019 was 13.  MRI of brain with and without contrast on 12/10/2019 showed abnormal signal within the dorsal midbrain and pons and cerebellum, suspicious for toxic/metabolic disorders such as Wernicke encephalopathy.  MRA of head was normal.  Denied heavy alcohol  use.  Vitamin B1 level was 7 and he was advised to start thiamine 100mg  daily.  Follow up MRI of brain with and without contrast on 05/08/2020 showed no evidence of signal abnormality suggesting that the previous findings were likely artifactual.    He does have a history of different dull frontal headaches and ocular migraines (kaleidoscopic vision, no headache).  Past NSAIDS/steroid:  ASA,  ibuprofen, prednisone (can't take nsaids now due to Eliquis) Past analgesics:  tramadol Past abortive triptans:  none Past abortive ergotamine:  none Past muscle relaxants:  none Past anti-emetic:  none Past antihypertensive medications:  none Past antidepressant medications:  none Past anticonvulsant medications:  none Past anti-CGRP:  Ubrelvy (ineffective) Past vitamins/Herbal/Supplements:  none Past antihistamines/decongestants:  none Other past therapies:  none   PAST MEDICAL HISTORY: Past Medical History:  Diagnosis Date   Arthritis    DVT (deep venous thrombosis) (HCC)    left leg   Hyperlipidemia    Hypertension    Peripheral vascular disease (HCC)    Stroke (HCC)     MEDICATIONS: Current Outpatient Medications on File Prior to Visit  Medication Sig Dispense Refill   amLODipine (NORVASC) 5 MG tablet Take 1 tablet (5 mg total) by mouth daily for 30 days. (Patient taking differently: Take 5 mg by mouth at bedtime.) 30 tablet 0   Ascorbic Acid (VITAMIN C) 1000 MG tablet Take 1,000 mg by mouth daily.     aspirin EC 81 MG tablet 1 tablet Orally Once a day     cholecalciferol (VITAMIN D3) 25 MCG (1000 UNIT) tablet Take 1,000 Units by mouth daily.     clopidogrel (PLAVIX) 75 MG tablet Take 1 tablet (75 mg total) by mouth daily. (Patient taking differently: Take 75 mg by mouth at bedtime.) 30 tablet 2   famotidine-calcium carbonate-magnesium hydroxide (PEPCID COMPLETE) 10-800-165 MG chewable tablet Chew 1 tablet by mouth at bedtime.     Magnesium 250 MG TABS Take 250 mg by mouth daily.     Multiple Vitamin (MULTI VITAMIN) TABS Take 1 tablet by mouth daily. Centrum Silver     nortriptyline (PAMELOR) 10 MG capsule Take 2 capsules (20 mg total) by mouth at bedtime. (Patient taking differently: Take 10 mg by mouth at bedtime.) 60 capsule 5   rosuvastatin (CRESTOR) 40 MG tablet TAKE 1 TABLET(40 MG) BY MOUTH DAILY 30 tablet 0   Study - OCEANIC-STROKE - asundexian 50 mg or placebo  tablet (PI-Sethi) Take 1 tablet (50 mg total) by mouth daily. For Investigational Use Only. Take at the same time each day (preferably in the morning). Tablet should be swallowed whole with water; it CANNOT be crushed or broken. Please contact [facility] for any questions or concerns regarding this medication. 98 tablet 0   No current facility-administered medications on file prior to visit.     ALLERGIES: No Known Allergies  FAMILY HISTORY: Family History  Problem Relation Age of Onset   Heart attack Mother    Heart disease Mother    Heart disease Father    Stroke Father       Objective:  *** General: No acute distress.  Patient appears well-groomed.   Head:  Normocephalic/atraumatic Eyes:  Fundi examined but not visualized Neck: supple, no paraspinal tenderness, full range of motion Heart:  Regular rate and rhythm Neurological Exam: ***   Shon Millet, DO  CC: [name], MD

## 2022-11-09 ENCOUNTER — Ambulatory Visit: Payer: Medicare HMO | Admitting: Neurology

## 2022-11-17 ENCOUNTER — Telehealth: Payer: Self-pay | Admitting: Internal Medicine

## 2022-11-21 ENCOUNTER — Other Ambulatory Visit: Payer: Self-pay

## 2022-11-21 DIAGNOSIS — G4733 Obstructive sleep apnea (adult) (pediatric): Secondary | ICD-10-CM

## 2022-11-21 NOTE — Telephone Encounter (Signed)
Placed Urgent order to [facility] to adjust cpap settings. Made myself a note to provide Dr. Maple Hudson a download in one week. Closing encounter. NFN

## 2022-11-21 NOTE — Telephone Encounter (Signed)
Order- DME [facility]- please change CPAP auto range to 10-20 and then provide download in 1 week

## 2022-11-23 ENCOUNTER — Encounter: Payer: Self-pay | Admitting: Internal Medicine

## 2022-11-23 NOTE — Telephone Encounter (Signed)
I had sent an order on 8/8 to [facility] to change the pressure setting on your CPAP to autopap range 10 -20 cm/ h20. If [facility] hasn't reached you, please check to make sure your voice mail isn't full. You can go ahead and call them directly.  20 is as high as standard CPAP machines can go. I do need to see the download on the new setting so I can tell what is going on. It is possible that we will need to talk about using a different kind of machine. Please let me know if you aren't comfortable on the new settings.

## 2022-12-07 NOTE — Progress Notes (Signed)
06/06/22- 71 yoM former smoker for sleep evaluation courtesy of Dr Everlena Cooper with concern of Headache Medical problem list includes HTN, Carotid Stenosis, CVA(MCA stroke), DVT, GERD, Hyperlipidemia,  Epworth score-12 Body weight today-217 lbs Covid vax- 4 Phizer Flu vax-had No use of cpap machine, never had a sleep study -----Patient states he wakes up throughout the night, doesn't ever feel rested and is always tired, most morning wakes up with a headache  Lives alone but aware of snoring.  Brother has obstructive sleep apnea and probably grandfather as well. Primary concern is morning headache.  Often some daytime tiredness.  Works from home and is able to nap occasionally. Tried melatonin at night to consolidate sleep but says it made him groggy.  Drugs other OTC product.  3 cups morning coffee, none after that.  1 glass of wine at night.  Goes to a gym 3 days a week for which she is up at 5 AM and goes to bed those nights at 9 PM.  Otherwise bedtime around 10 PM and gets up around 7 AM. ENT surgery for tonsils as a child.  No active cardiopulmonary problems.  Did require right carotid stent after CVA.  09/06/22- 71yoM former smoker followed for OSA, complicated by Headache, HTN, Carotid Stenosis, CVA(MCA stroke), DVT, GERD, Hyperlipidemia,  HST 07/17/22- AHI 32.2/ hr, desaturation to 67%, body weight 217 lbs Body weight today- For treatment decision We discussed his sleep study results and treatment options.  He is strongly intolerant of anything confining and is concerned that a CPAP mask might be similar.  We are going to refer him for both CPAP and for consideration of an oral appliance so that he can decide.  Ultimately he may be a good [facility] candidate.  12/09/22- 66QIH former smoker followed for OSA, complicated by Headache, HTN, Carotid Stenosis, CVA(MCA stroke), DVT, GERD, Hyperlipidemia,  HST 07/17/22- AHI 32.2/ hr, desaturation to 67%, body weight 217 lbs Body weight today-212 lbs CPAP auto  10-20/ [facility]-    We had referred to Dr Myrtis Ser for OAP 5/28, but he says Medicare wouldn't approve unless he had tried CPAP first. Download compliance-37%, AHI 3.1/ hr Using nasal pillows mask. Current pressure range is more comfortable, but "I still don't like it". Discussed alternatives. He can check again with Dr Myrtis Ser' office about an OAP. Variable nasal stuffiness. 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 Trying to use Breathe Right Strips. I suggested trial of flonase. Might justify ENT in future.  ROS-see HPI    + = positive Constitutional:    weight loss, night sweats, fevers, chills, fatigue, lassitude. HEENT:    headaches, difficulty swallowing, tooth/dental problems, sore throat,       sneezing, itching, ear ache, +nasal congestion, post nasal drip, snoring CV:    chest pain, orthopnea, PND, swelling in lower extremities, anasarca,                                   dizziness, palpitations Resp:   shortness of breath with exertion or at rest.                productive cough,   non-productive cough, coughing up of blood.              change in color of mucus.  wheezing.   Skin:    rash or lesions. GI:  No-   heartburn, indigestion, abdominal pain, nausea, vomiting, diarrhea,  change in bowel habits, loss of appetite GU: dysuria, change in color of urine, no urgency or frequency.   flank pain. MS:   joint pain, stiffness, decreased range of motion, back pain. Neuro-     nothing unusual Psych:  change in mood or affect.  depression or anxiety.   memory loss.  OBJ- Physical Exam General- Alert, Oriented, Affect-appropriate, Distress- none acute Skin- rash-none, lesions- none, excoriation- none Lymphadenopathy- none Head- atraumatic            Eyes- Gross vision intact, PERRLA, conjunctivae and secretions clear            Ears- Hearing, canals-normal            Nose- Clear, no-Septal dev, mucus, polyps, erosion, perforation             Throat- Mallampati  III-IV , mucosa clear , drainage- none, tonsils- atrophic, + teeth Neck- flexible , trachea midline, no stridor , thyroid nl, carotid no bruit Chest - symmetrical excursion , unlabored           Heart/CV- RRR , no murmur , no gallop  , no rub, nl s1 s2                           - JVD- none , edema- none, stasis changes- none, varices+ calves           Lung- clear to P&A, wheeze- none, cough- none , dullness-none, rub- none           Chest wall-  Abd-  Br/ Gen/ Rectal- Not done, not indicated Extrem- cyanosis- none, clubbing, none, atrophy- none, strength- nl Neuro- grossly intact to observation

## 2022-12-09 ENCOUNTER — Ambulatory Visit: Payer: Medicare HMO | Admitting: Internal Medicine

## 2022-12-09 ENCOUNTER — Encounter: Payer: Self-pay | Admitting: Internal Medicine

## 2022-12-09 VITALS — BP 124/74 | HR 76 | Temp 97.3°F | Ht 70.0 in | Wt 212.8 lb

## 2022-12-09 DIAGNOSIS — G4733 Obstructive sleep apnea (adult) (pediatric): Secondary | ICD-10-CM | POA: Diagnosis not present

## 2022-12-09 DIAGNOSIS — I10 Essential (primary) hypertension: Secondary | ICD-10-CM | POA: Diagnosis not present

## 2022-12-09 NOTE — Assessment & Plan Note (Signed)
124/74 at this visit. Aware of association between HTN and untreated OSA

## 2022-12-09 NOTE — Assessment & Plan Note (Signed)
He doesn't like CPAP. We are adjusting pressures, but he should look again at possibility of a fitted oral appliance. Plan- he will contact Dr Myrtis Ser' office again.

## 2022-12-09 NOTE — Patient Instructions (Signed)
Order- DME [facility]- please change autopap range to 10-12  Order- refer back to Dr Althea Grimmer, Orthodontist- consider oral appliance for osa

## 2023-01-03 ENCOUNTER — Encounter: Payer: Self-pay | Admitting: Internal Medicine

## 2023-01-16 NOTE — Progress Notes (Unsigned)
NEUROLOGY FOLLOW UP OFFICE NOTE  Ryan Franklin [id number]  Assessment/Plan:   Scattered small ischemic infarcts in the right MCA territory.  Cardiology believed it was secondary to right ICA stenosis, now s/p stent. Headache, nonspecific, not intractable - with morning headaches and daytime fatigue, consider OSA Hypertension Hyperlipidemia Gait instability - exhibits decreased vibratory sensation in toes, query underlying polyneuropathy   Nortriptyline 10mg  at bedtime for headache prevention.   Neuropathy workup: NCV-EMG lower extremities Labs:  B12, TSH, IFE, ANA with ENA panel Secondary stroke prevention as managed by PCP: Plavix 75mg  daily Statin.  LDL goal less than 70 Normotensive blood pressure Hgb A1c goal less than 7 Management of OSA as per sleep medicine Mediterranean diet Routine exercise Follow up with Dr. Pearlean Brownie regarding the [facility] Trial.  Follow up one year or as needed.    Subjective:  Ryan Franklin is a 72 year old right-handed male with HTN, HLD and PUD with history of GI bleed and DVT follows up for headache and stroke.  UPDATE: Current medications:  Plavix 75mg  daily, rosuvastatin 20mg  daily, nortriptyline 10mg  at bedtime  Diagnosed with OSA.  However unable to tolerate CPAP.  However, headaches improved on nortriptyline.  They are not severe and are only occasional.    Ryan Franklin has been working out.  Sees a trainer 3 days a week.  Trying to lose weight.    Sometimes if Ryan Franklin turns too quickly, Ryan Franklin briefly loses his balance.  Ryan Franklin hasn't fallen.    Repeat carotid ultrasound from June 2024 revealed no hemodynamically significant stenosis.    HISTORY: Stroke: On 09/26/2021, Ryan Franklin had episode of lightheadedness with left leg numbness lasting 45 minutes.  Several days prior, Ryan Franklin reported brief episode of left arm incoordination.  CT head showed no acute abnormality but follow up MRI revealed numerous scattered small acute infarcts in the right MCA  territory.  CTA of head and neck showed atherosclerotic changes at the carotid bifurcations but no large vessel occlusion or hemodynamically significant stenosis.  2D echo with bubble study showed EF 60-65% with no interatrial shunt.  LDL was 107 and Hgb A1c was 5.4.  LE venous doppler showed evidence of known old DVT in left popliteal but no acute DVT.  Ryan Franklin was on ASA 81mg  prior to admission and discharged on ASA 81mg  and Plavix 75mg  daily for 21 days followed by Plavix monotherapy.  Home rosuvastatin was increased from 10mg  to 20mg  daily.  Ryan Franklin also entered the [facility] trial (on asundexian vs placebo).  Ryan Franklin was discharged and had an outpatient TEE on 6/22 which revealed calcification of the aortic valve but no significant valve disease, no thrombus, PFO or other cardiac source of emboli.  Since discharge, Ryan Franklin has been feeling well.  This morning in the shower, Ryan Franklin had a new pruritic rash which has since resolved.  Denies history of palpitations.  Headache: Ryan Franklin began having new daily near-persistent headaches in early 2021.  They are usually a severe right-sided non-throbbing pain.  They sometimes wake him up in the middle of the night.  Sometimes Ryan Franklin is bed-bound.  On rare occasions, Ryan Franklin has noted brief flashes in his left eye.  There is no associated nausea, vomiting, photophobia, phonophobia, osmophobia, speech disturbance, dizziness, numbness or weakness.  Ryan Franklin has some neck tightness.  Ryan Franklin was initially taking ibuprofen and ASA daily, but later was changed to tramadol, which Ryan Franklin rarely takes (only if needed) but does seem to dull the pain.  No specific triggers.   Sed  rate on 10/07/2019 was 13.  MRI of brain with and without contrast on 12/10/2019 showed abnormal signal within the dorsal midbrain and pons and cerebellum, suspicious for toxic/metabolic disorders such as Wernicke encephalopathy.  MRA of head was normal.  Denied heavy alcohol use.  Vitamin B1 level was 7 and Ryan Franklin was advised to start thiamine 100mg   daily.  Follow up MRI of brain with and without contrast on 05/08/2020 showed no evidence of signal abnormality suggesting that the previous findings were likely artifactual.    Ryan Franklin does have a history of different dull frontal headaches and ocular migraines (kaleidoscopic vision, no headache).  Past NSAIDS/steroid:  ASA, ibuprofen, prednisone (can't take nsaids now due to Eliquis) Past analgesics:  tramadol Past abortive triptans:  none Past abortive ergotamine:  none Past muscle relaxants:  none Past anti-emetic:  none Past antihypertensive medications:  none Past antidepressant medications:  none Past anticonvulsant medications:  none Past anti-CGRP:  Ubrelvy (ineffective) Past vitamins/Herbal/Supplements:  none Past antihistamines/decongestants:  none Other past therapies:  none   PAST MEDICAL HISTORY: Past Medical History:  Diagnosis Date   Arthritis    DVT (deep venous thrombosis) (HCC)    left leg   Hyperlipidemia    Hypertension    Peripheral vascular disease (HCC)    Stroke (HCC)     MEDICATIONS: Current Outpatient Medications on File Prior to Visit  Medication Sig Dispense Refill   amLODipine (NORVASC) 5 MG tablet Take 1 tablet (5 mg total) by mouth daily for 30 days. (Patient taking differently: Take 5 mg by mouth at bedtime.) 30 tablet 0   Ascorbic Acid (VITAMIN C) 1000 MG tablet Take 1,000 mg by mouth daily.     aspirin EC 81 MG tablet 1 tablet Orally Once a day     cholecalciferol (VITAMIN D3) 25 MCG (1000 UNIT) tablet Take 1,000 Units by mouth daily.     clopidogrel (PLAVIX) 75 MG tablet Take 1 tablet (75 mg total) by mouth daily. (Patient taking differently: Take 75 mg by mouth at bedtime.) 30 tablet 2   famotidine-calcium carbonate-magnesium hydroxide (PEPCID COMPLETE) 10-800-165 MG chewable tablet Chew 1 tablet by mouth at bedtime.     Magnesium 250 MG TABS Take 250 mg by mouth daily.     Multiple Vitamin (MULTI VITAMIN) TABS Take 1 tablet by mouth daily.  Centrum Silver     nortriptyline (PAMELOR) 10 MG capsule Take 2 capsules (20 mg total) by mouth at bedtime. (Patient taking differently: Take 10 mg by mouth at bedtime.) 60 capsule 5   rosuvastatin (CRESTOR) 40 MG tablet TAKE 1 TABLET(40 MG) BY MOUTH DAILY 30 tablet 0   Study - OCEANIC-STROKE - asundexian 50 mg or placebo tablet (PI-Sethi) Take 1 tablet (50 mg total) by mouth daily. For Investigational Use Only. Take at the same time each day (preferably in the morning). Tablet should be swallowed whole with water; it CANNOT be crushed or broken. Please contact [facility] for any questions or concerns regarding this medication. 98 tablet 0   No current facility-administered medications on file prior to visit.     ALLERGIES: No Known Allergies  FAMILY HISTORY: Family History  Problem Relation Age of Onset   Heart attack Mother    Heart disease Mother    Heart disease Father    Stroke Father       Objective:  Blood pressure 120/72, pulse (!) 52, height 5\' 10"  (1.778 m), weight 202 lb (91.6 kg), SpO2 96%. General: No acute distress.  Patient appears well-groomed.   Head:  Normocephalic/atraumatic Eyes:  Fundi examined but not visualized Neck: supple, no paraspinal tenderness, full range of motion Heart:  Regular rate and rhythm Neurological Exam: alert and oriented.  Speech fluent and not dysarthric, language intact.  CN II-XII intact. Bulk and tone normal, muscle strength 5/5 throughout.  Sensation to pinprick intact, vibratory sensation reduced in toes.  Deep tendon reflexes 2+ throughout, toes downgoing.  Finger to nose testing intact.  Gait normal, able to tandem walk.  Romberg negative.   Shon Millet, DO  CC: [name], MD

## 2023-01-17 ENCOUNTER — Other Ambulatory Visit (INDEPENDENT_AMBULATORY_CARE_PROVIDER_SITE_OTHER): Payer: Medicare HMO

## 2023-01-17 ENCOUNTER — Encounter: Payer: Self-pay | Admitting: Neurology

## 2023-01-17 ENCOUNTER — Ambulatory Visit: Payer: Medicare HMO | Admitting: Neurology

## 2023-01-17 VITALS — BP 120/72 | HR 52 | Ht 70.0 in | Wt 202.0 lb

## 2023-01-17 DIAGNOSIS — E785 Hyperlipidemia, unspecified: Secondary | ICD-10-CM

## 2023-01-17 DIAGNOSIS — R519 Headache, unspecified: Secondary | ICD-10-CM

## 2023-01-17 DIAGNOSIS — R2681 Unsteadiness on feet: Secondary | ICD-10-CM

## 2023-01-17 DIAGNOSIS — I63511 Cerebral infarction due to unspecified occlusion or stenosis of right middle cerebral artery: Secondary | ICD-10-CM

## 2023-01-17 DIAGNOSIS — I10 Essential (primary) hypertension: Secondary | ICD-10-CM

## 2023-01-17 DIAGNOSIS — G4733 Obstructive sleep apnea (adult) (pediatric): Secondary | ICD-10-CM | POA: Diagnosis not present

## 2023-01-17 MED ORDER — NORTRIPTYLINE HCL 10 MG PO CAPS
10.0000 mg | ORAL_CAPSULE | Freq: Every day | ORAL | 11 refills | Status: AC
Start: 2023-01-17 — End: ?

## 2023-01-17 NOTE — Patient Instructions (Signed)
Continue nortriptyline 10mg  at bedtime Will perform neuropathy workup: Check labs B12, TSH, IFE, ANA with ENA panel Check nerve study of lower extremities Follow up one year or as needed

## 2023-01-18 DIAGNOSIS — Z23 Encounter for immunization: Secondary | ICD-10-CM | POA: Diagnosis not present

## 2023-01-18 LAB — TSH: TSH: 2.43 u[IU]/mL (ref 0.35–5.50)

## 2023-01-18 LAB — VITAMIN B12: Vitamin B-12: 268 pg/mL (ref 211–911)

## 2023-01-22 LAB — ANA+ENA+DNA/DS+SCL 70+SJOSSA/B
ANA Titer 1: NEGATIVE
ENA RNP Ab: <0.2 AI (ref 0.0–0.9)
ENA SM Ab Ser-aCnc: <0.2 AI (ref 0.0–0.9)
ENA SSA (RO) Ab: <0.2 AI (ref 0.0–0.9)
ENA SSB (LA) Ab: <0.2 AI (ref 0.0–0.9)
Scleroderma (Scl-70) (ENA) Antibody, IgG: <0.2 AI (ref 0.0–0.9)
dsDNA Ab: <1 [IU]/mL (ref 0–9)

## 2023-01-22 LAB — IMMUNOFIXATION, SERUM
IgA/Immunoglobulin A, Serum: 267 mg/dL (ref 61–437)
IgG (Immunoglobin G), Serum: 967 mg/dL (ref 603–1613)
IgM (Immunoglobulin M), Srm: 70 mg/dL (ref 20–172)

## 2023-02-14 ENCOUNTER — Ambulatory Visit: Payer: Medicare HMO | Admitting: Neurology

## 2023-02-14 DIAGNOSIS — R2681 Unsteadiness on feet: Secondary | ICD-10-CM | POA: Diagnosis not present

## 2023-02-14 NOTE — Procedures (Signed)
[facility]  [address], [address]  [place], [place] [address] Tel: [phone] Fax: [phone] Test Date:  02/14/2023  Patient: Ryan Franklin DOB: 06/05/1950 Physician: Jacquelyne Balint, MD  Sex: Male Height: 5\' 10"  Ref Phys: Shon Millet, DO  ID#: [id number]   Technician:    History: This is a 72 year old male gait instability and diminished sensation in his feet.  NCV & EMG Findings: Extensive electrodiagnostic evaluation of bilateral lower limbs shows: Bilateral sural and superficial peroneal/fibular sensory responses are within normal limits. Bilateral peroneal/fibular (EDB) and tibial (AH) motor responses are within normal limits. Bilateral H reflex latency is within normal limits. There is no evidence of active or chronic motor axon loss changes affecting any of the tested muscles. Motor unit configuration and recruitment pattern is within normal limits.  Impression: This is a normal study of bilateral lower limbs. In particular, there is no electrodiagnostic evidence of a right or left lumbosacral (L3-S1) radiculopathy, large fiber sensorimotor neuropathy, or myopathy.    ___________________________ Jacquelyne Balint, MD    Nerve Conduction Studies Motor Nerve Results    Latency Amplitude F-Lat Segment Distance CV Comment  Site (ms) Norm (mV) Norm (ms)  (cm) (m/s) Norm   Left Fibular (EDB) Motor  Ankle 3.5  < 6.0 3.5  > 2.5        Bel fib head 10.1 - 3.2 -  Bel fib head-Ankle 31.5 48  > 40   Pop fossa 11.8 - 3.2 -  Pop fossa-Bel fib head 9 53 -   Right Fibular (EDB) Motor  Ankle 2.7  < 6.0 2.5  > 2.5        Bel fib head 10.1 - 2.3 -  Bel fib head-Ankle 31.5 43  > 40   Pop fossa 12.0 - 2.1 -  Pop fossa-Bel fib head 9 47 -   Left Tibial (AH) Motor  Ankle 3.0  < 6.0 13.0  > 4.0        Knee 11.6 - 10.0 -  Knee-Ankle 42 49  > 40   Right Tibial (AH) Motor  Ankle 2.9  < 6.0 18.2  > 4.0        Knee 11.4 - 10.6 -  Knee-Ankle 43 51  > 40    Sensory Sites     Neg Peak Lat Amplitude (O-P) Segment Distance Velocity Comment  Site (ms) Norm (V) Norm  (cm) (ms)   Left Superficial Fibular Sensory  14 cm-Ankle 2.7  < 4.6 4  > 3 14 cm-Ankle 14    Right Superficial Fibular Sensory  14 cm-Ankle 2.9  < 4.6 5  > 3 14 cm-Ankle 14    Left Sural Sensory  Calf-Lat mall 3.6  < 4.6 7  > 3 Calf-Lat mall 14    Right Sural Sensory  Calf-Lat mall 3.4  < 4.6 10  > 3 Calf-Lat mall 14     H-Reflex Results    M-Lat H Lat H Neg Amp H-M Lat  Site (ms) (ms) Norm (mV) (ms)  Left Tibial H-Reflex  Pop fossa 5.3 30.8  < 35.0 4.0 25.5  Right Tibial H-Reflex  Pop fossa 5.5 31.0  < 35.0 5.3 25.5   Electromyography   Side Muscle Ins.Act Fibs Fasc Recrt Amp Dur Poly Activation Comment  Left Tib ant Nml Nml Nml Nml Nml Nml Nml Nml N/A  Left Gastroc MH Nml Nml Nml Nml Nml Nml Nml Nml N/A  Left Vastus lat Nml Nml Nml Nml  Nml Nml Nml Nml N/A  Left Biceps fem SH Nml Nml Nml Nml Nml Nml Nml Nml N/A  Left Gluteus med Nml Nml Nml Nml Nml Nml Nml Nml N/A  Right Tib ant Nml Nml Nml Nml Nml Nml Nml Nml N/A  Right Gastroc MH Nml Nml Nml Nml Nml Nml Nml Nml N/A  Right Vastus lat Nml Nml Nml Nml Nml Nml Nml Nml N/A  Right Biceps fem SH Nml Nml Nml Nml Nml Nml Nml Nml N/A  Right Gluteus med Nml Nml Nml Nml Nml Nml Nml Nml N/A      Waveforms:  Motor           Sensory           H-Reflex

## 2023-03-07 DIAGNOSIS — L309 Dermatitis, unspecified: Secondary | ICD-10-CM | POA: Diagnosis not present

## 2023-03-07 DIAGNOSIS — G4489 Other headache syndrome: Secondary | ICD-10-CM | POA: Diagnosis not present

## 2023-03-07 DIAGNOSIS — Z125 Encounter for screening for malignant neoplasm of prostate: Secondary | ICD-10-CM | POA: Diagnosis not present

## 2023-03-07 DIAGNOSIS — G4733 Obstructive sleep apnea (adult) (pediatric): Secondary | ICD-10-CM | POA: Diagnosis not present

## 2023-03-07 DIAGNOSIS — I679 Cerebrovascular disease, unspecified: Secondary | ICD-10-CM | POA: Diagnosis not present

## 2023-03-07 DIAGNOSIS — K219 Gastro-esophageal reflux disease without esophagitis: Secondary | ICD-10-CM | POA: Diagnosis not present

## 2023-03-07 DIAGNOSIS — E7800 Pure hypercholesterolemia, unspecified: Secondary | ICD-10-CM | POA: Diagnosis not present

## 2023-03-07 DIAGNOSIS — I10 Essential (primary) hypertension: Secondary | ICD-10-CM | POA: Diagnosis not present

## 2023-03-07 DIAGNOSIS — Z0000 Encounter for general adult medical examination without abnormal findings: Secondary | ICD-10-CM | POA: Diagnosis not present

## 2023-03-11 NOTE — Progress Notes (Signed)
HPI M former smoker followed for OSA, complicated by Headache, HTN, Carotid Stenosis, CVA(MCA stroke), DVT, GERD, Hyperlipidemia,  HST 07/17/22- AHI 32.2/ hr, desaturation to 67%, body weight 217 lbs  ===============================================  12/09/22- 71yoM former smoker followed for OSA, complicated by Headache, HTN, Carotid Stenosis, CVA(MCA stroke), DVT, GERD, Hyperlipidemia,  HST 07/17/22- AHI 32.2/ hr, desaturation to 67%, body weight 217 lbs Body weight today-212 lbs CPAP auto 10-20/ [facility]-    We had referred to Dr Myrtis Ser for OAP 5/28, but he says Medicare wouldn't approve unless he had tried CPAP first. Download compliance-37%, AHI 3.1/ hr Using nasal pillows mask. Current pressure range is more comfortable, but "I still don't like it". Discussed alternatives. He can check again with Dr Myrtis Ser' office about an OAP. Variable nasal stuffiness. 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 Trying to use Breathe Right Strips. I suggested trial of flonase. Might justify ENT in future.  03/14/23- 71yoM former smoker followed for OSA, complicated by Headache, HTN, Carotid Stenosis, CVA(MCA stroke), DVT, GERD, Hyperlipidemia,  HST 07/17/22- AHI 32.2/ hr, desaturation to 67%, body weight 217 lbs Body weight today- CPAP auto 10-20/ [facility]-  Download compliance-  -----Unable to use CPAP.  Not comfortable to sleep with.  Using Breath-right strips on nose - this helps some with breathing and sleep. Sleeps off back. Stays tired. Willing to look again at a fitted oral appliance. Not obese and may be candidate for Inspire. Had flu vax.   ROS-see HPI    + = positive Constitutional:    weight loss, night sweats, fevers, chills, fatigue, lassitude. HEENT:    headaches, difficulty swallowing, tooth/dental problems, sore throat,       sneezing, itching, ear ache, +nasal congestion, post nasal drip, snoring CV:    chest pain, orthopnea, PND, swelling in lower extremities, anasarca,                                    dizziness, palpitations Resp:   shortness of breath with exertion or at rest.                productive cough,   non-productive cough, coughing up of blood.              change in color of mucus.  wheezing.   Skin:    rash or lesions. GI:  No-   heartburn, indigestion, abdominal pain, nausea, vomiting, diarrhea,                 change in bowel habits, loss of appetite GU: dysuria, change in color of urine, no urgency or frequency.   flank pain. MS:   joint pain, stiffness, decreased range of motion, back pain. Neuro-     nothing unusual Psych:  change in mood or affect.  depression or anxiety.   memory loss.  OBJ- Physical Exam General- Alert, Oriented, Affect-appropriate, Distress- none acute Skin- rash-none, lesions- none, excoriation- none Lymphadenopathy- none Head- atraumatic            Eyes- Gross vision intact, PERRLA, conjunctivae and secretions clear            Ears- Hearing, canals-normal            Nose- Clear, no-Septal dev, mucus, polyps, erosion, perforation             Throat- Mallampati III-IV , mucosa clear , drainage- none, tonsils- atrophic, + teeth Neck- flexible ,  trachea midline, no stridor , thyroid nl, carotid no bruit Chest - symmetrical excursion , unlabored           Heart/CV- RRR , no murmur , no gallop  , no rub, nl s1 s2                           - JVD- none , edema- none, stasis changes- none, varices+ calves           Lung- clear to P&A, wheeze- none, cough- none , dullness-none, rub- none           Chest wall-  Abd-  Br/ Gen/ Rectal- Not done, not indicated Extrem- cyanosis- none, clubbing, none, atrophy- none, strength- nl Neuro- grossly intact to observation

## 2023-03-14 ENCOUNTER — Encounter: Payer: Self-pay | Admitting: Internal Medicine

## 2023-03-14 ENCOUNTER — Ambulatory Visit: Payer: Medicare HMO | Admitting: Internal Medicine

## 2023-03-14 VITALS — BP 106/74 | HR 77 | Temp 98.3°F | Ht 70.0 in | Wt 207.2 lb

## 2023-03-14 DIAGNOSIS — K219 Gastro-esophageal reflux disease without esophagitis: Secondary | ICD-10-CM

## 2023-03-14 DIAGNOSIS — G4733 Obstructive sleep apnea (adult) (pediatric): Secondary | ICD-10-CM

## 2023-03-14 NOTE — Patient Instructions (Signed)
Order- refer to orthodontics Dr Althea Grimmer   failed CPAP. Consider oral appliance for OSA  Please call if we can help

## 2023-03-23 ENCOUNTER — Other Ambulatory Visit (HOSPITAL_COMMUNITY): Payer: Self-pay | Admitting: Neurology

## 2023-03-23 MED ORDER — STUDY - OCEANIC-STROKE - ASUNDEXIAN 50 MG OR PLACEBO TABLET (PI-SETHI): 1.0000 | ORAL_TABLET | Freq: Every day | ORAL | 0 refills | Status: DC

## 2023-04-25 DIAGNOSIS — I871 Compression of vein: Secondary | ICD-10-CM | POA: Diagnosis not present

## 2023-04-25 DIAGNOSIS — D485 Neoplasm of uncertain behavior of skin: Secondary | ICD-10-CM | POA: Diagnosis not present

## 2023-04-25 DIAGNOSIS — I872 Venous insufficiency (chronic) (peripheral): Secondary | ICD-10-CM | POA: Diagnosis not present

## 2023-04-25 DIAGNOSIS — D234 Other benign neoplasm of skin of scalp and neck: Secondary | ICD-10-CM | POA: Diagnosis not present

## 2023-04-25 DIAGNOSIS — L728 Other follicular cysts of the skin and subcutaneous tissue: Secondary | ICD-10-CM | POA: Diagnosis not present

## 2023-04-29 ENCOUNTER — Encounter: Payer: Self-pay | Admitting: Internal Medicine

## 2023-04-29 NOTE — Assessment & Plan Note (Signed)
He has made legitimate effort but can't tolerate CPAP Plan- refer for oral appliance

## 2023-04-29 NOTE — Assessment & Plan Note (Signed)
GERD precautions with fitted oral appliance for OSA

## 2023-06-01 DIAGNOSIS — H5203 Hypermetropia, bilateral: Secondary | ICD-10-CM | POA: Diagnosis not present

## 2023-06-01 DIAGNOSIS — H524 Presbyopia: Secondary | ICD-10-CM | POA: Diagnosis not present

## 2023-06-01 DIAGNOSIS — H52223 Regular astigmatism, bilateral: Secondary | ICD-10-CM | POA: Diagnosis not present

## 2023-06-15 IMAGING — CT CT ANGIO HEAD
1 of 11 series · 5 of 33 positions shown · IV contrast (omnipaque)
Comparison: Head MRI 05/08/2020

CLINICAL DATA: Carotid stenosis, right.

EXAM:
CT ANGIOGRAPHY HEAD AND NECK
TECHNIQUE: Multidetector CT imaging of the head and neck was performed using
the standard protocol during bolus administration of intravenous
contrast. Multiplanar CT image reconstructions and MIPs were
obtained to evaluate the vascular anatomy. Carotid stenosis
measurements (when applicable) are obtained utilizing NASCET
criteria, using the distal internal carotid diameter as the
denominator.
CONTRAST:  75mL OMNIPAQUE IOHEXOL 350 MG/ML SOLN

[Series 11: ax thin · axial · 0.39mm/px · z∈[+1400,+1651]mm · 5 of 377 slices shown]
[im 63/377  soft-tissue]
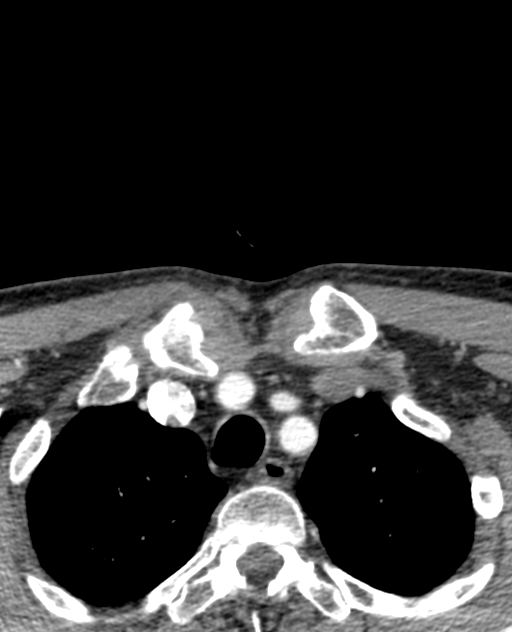
[im 126/377  bone]
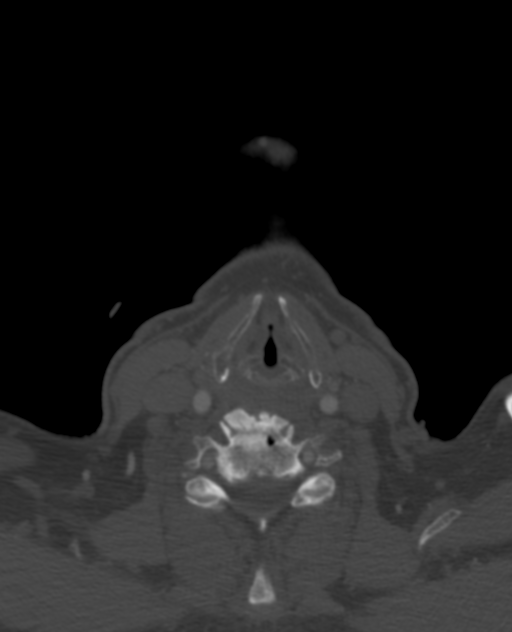
[im 189/377  soft-tissue]
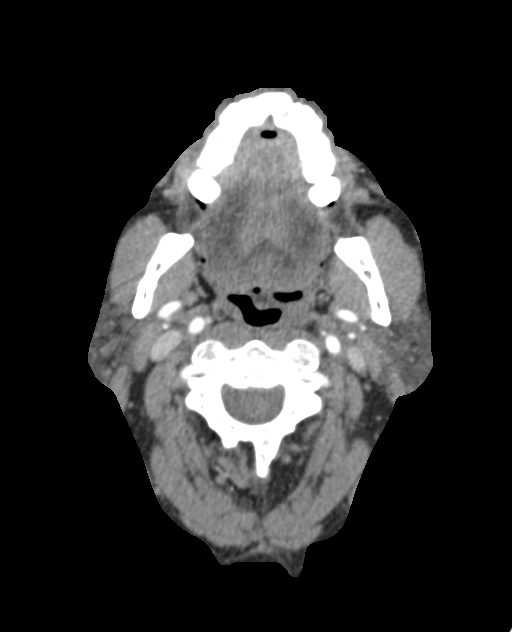
[im 251/377  bone]
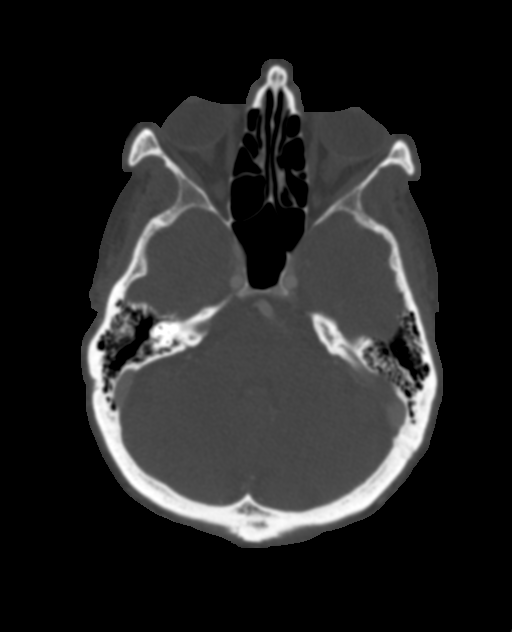
[im 314/377  soft-tissue]
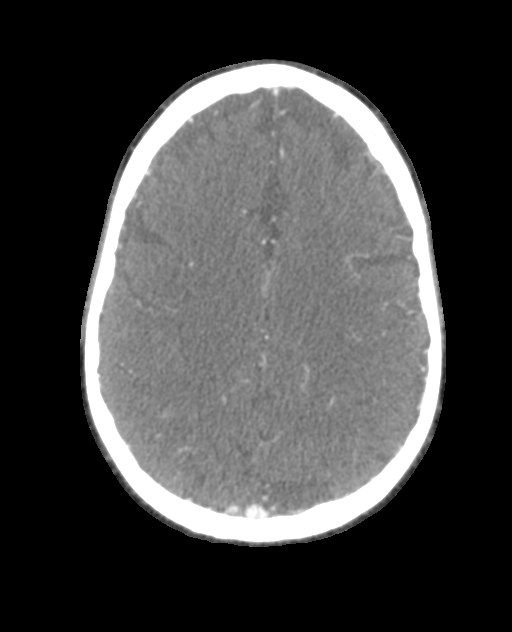

[5 of 33 positions shown; findings below may reference images not displayed]

FINDINGS: CT HEAD FINDINGS

Brain: There is no evidence of an acute infarct, intracranial
hemorrhage, mass, midline shift, or extra-axial fluid collection.
There is mild cerebral atrophy. A chronic white matter lacunar
infarct is again noted at the anterior aspect of the right basal
ganglia.

Vascular: Reported below.

Skull: No fracture or suspicious osseous lesion.

Sinuses: Visualized paranasal sinuses and mastoid air cells are
clear.

Orbits: Unremarkable.

Review of the MIP images confirms the above findings

CTA NECK FINDINGS

Aortic arch: Standard 3 vessel aortic arch with widely patent arch
vessel origins.

Right carotid system: Patent with a moderate amount of calcified and
soft plaque in the carotid bulb resulting in 50% stenosis of the
proximal ICA near the distal aspect of the bulb.

Left carotid system: Patent with a small amount of calcified and
soft plaque in the carotid bulb. No evidence of a significant
stenosis or dissection.

Vertebral arteries: Patent and codominant without evidence of a
significant stenosis or dissection.

Skeleton: Advanced cervical disc degeneration.

Other neck: No evidence of cervical lymphadenopathy or mass.

Upper chest: Clear lung apices.

Review of the MIP images confirms the above findings

CTA HEAD FINDINGS

Anterior circulation: The internal carotid arteries are patent from
skull base to carotid termini with minimal nonstenotic plaque. ACAs
and MCAs are patent without evidence of a proximal branch occlusion
or significant proximal stenosis. No aneurysm is identified.

Posterior circulation: The intracranial vertebral arteries are
widely patent to the basilar. The left PICA and right AICA are
dominant. Patent SCAs are seen bilaterally. The basilar artery is
widely patent. Posterior communicating arteries are diminutive or
absent. PCAs are patent without evidence of a significant proximal
stenosis. No aneurysm is identified.

Venous sinuses: Patent.

Anatomic variants: None.

Review of the MIP images confirms the above findings
IMPRESSION: 1. 50% proximal right ICA stenosis.
2. Minimal intracranial atherosclerosis without major branch
occlusion or significant proximal stenosis.
3. No evidence of acute intracranial abnormality.

## 2023-08-21 DIAGNOSIS — M25562 Pain in left knee: Secondary | ICD-10-CM | POA: Diagnosis not present

## 2023-08-21 DIAGNOSIS — M25512 Pain in left shoulder: Secondary | ICD-10-CM | POA: Diagnosis not present

## 2023-08-30 ENCOUNTER — Other Ambulatory Visit: Payer: Self-pay | Admitting: *Deleted

## 2023-08-30 DIAGNOSIS — I6529 Occlusion and stenosis of unspecified carotid artery: Secondary | ICD-10-CM

## 2023-09-06 DIAGNOSIS — Z114 Encounter for screening for human immunodeficiency virus [HIV]: Secondary | ICD-10-CM | POA: Diagnosis not present

## 2023-09-06 DIAGNOSIS — Z202 Contact with and (suspected) exposure to infections with a predominantly sexual mode of transmission: Secondary | ICD-10-CM | POA: Diagnosis not present

## 2023-09-06 DIAGNOSIS — Z113 Encounter for screening for infections with a predominantly sexual mode of transmission: Secondary | ICD-10-CM | POA: Diagnosis not present

## 2023-09-06 DIAGNOSIS — Z206 Contact with and (suspected) exposure to human immunodeficiency virus [HIV]: Secondary | ICD-10-CM | POA: Diagnosis not present

## 2023-09-08 DIAGNOSIS — A546 Gonococcal infection of anus and rectum: Secondary | ICD-10-CM | POA: Diagnosis not present

## 2023-09-12 ENCOUNTER — Ambulatory Visit: Admitting: Vascular Surgery

## 2023-09-12 ENCOUNTER — Ambulatory Visit (HOSPITAL_COMMUNITY)
Admission: RE | Admit: 2023-09-12 | Discharge: 2023-09-12 | Disposition: A | Discharge status: Home / Self Care | Source: Ambulatory Visit | Attending: Vascular Surgery | Admitting: Vascular Surgery

## 2023-09-12 DIAGNOSIS — I6529 Occlusion and stenosis of unspecified carotid artery: Secondary | ICD-10-CM | POA: Diagnosis not present

## 2023-09-12 NOTE — Progress Notes (Deleted)
 Patient name: Ryan Franklin MRN: [id number] DOB: 1951-02-28 Sex: male  REASON FOR VISIT: 1 year follow-up, carotid surveilllance  HPI: Ryan Franklin is a 73 y.o. male with hx HTN and HLD who presents for 1 year follow-up for carotid artery surveillance.  He had a symptomatic 60% right ICA stenosis with ulcerated plaque and underwent right TCAR on 11/22/2021.  He did well postoperatively.    He remains on aspirin  Plavix  statin for his carotid stent.  He was also in the oceanic stroke study with neurology followed by Dr. Janett Medin.   Past Medical History:  Diagnosis Date   Arthritis    DVT (deep venous thrombosis) (HCC)    left leg   Hyperlipidemia    Hypertension    Peripheral vascular disease (HCC)    Stroke [facility])     Past Surgical History:  Procedure Laterality Date   BUBBLE STUDY  09/30/2021   Procedure: BUBBLE STUDY;  Surgeon: Hazle Lites, MD;  Location: [facility] ENDOSCOPY;  Service: Cardiovascular;;   HERNIA REPAIR     TEE WITHOUT CARDIOVERSION N/A 09/30/2021   Procedure: TRANSESOPHAGEAL ECHOCARDIOGRAM (TEE);  Surgeon: Hazle Lites, MD;  Location: [facility] ENDOSCOPY;  Service: Cardiovascular;  Laterality: N/A;   TRANSCAROTID ARTERY REVASCULARIZATION  Right 11/22/2021   Procedure: RIGHT TRANSCAROTID ARTERY REVASCULARIZATION;  Surgeon: Young Hensen, MD;  Location: [facility] OR;  Service: Vascular;  Laterality: Right;   ULTRASOUND GUIDANCE FOR VASCULAR ACCESS Left 11/22/2021   Procedure: ULTRASOUND GUIDANCE FOR VASCULAR ACCESS;  Surgeon: Young Hensen, MD;  Location: [facility] OR;  Service: Vascular;  Laterality: Left;    Family History  Problem Relation Age of Onset   Heart attack Mother    Heart disease Mother    Heart disease Father    Stroke Father     SOCIAL HISTORY: Social History   Tobacco Use   Smoking status: Former    Current packs/day: 0.00    Types: Cigarettes    Start date: 04/1976    Quit date: 04/1986    Years since quitting: 37.4   Smokeless  tobacco: Never  Substance Use Topics   Alcohol use: Yes    Alcohol/week: 7.0 standard drinks of alcohol    Types: 7 Glasses of wine per week    No Known Allergies  Current Outpatient Medications  Medication Sig Dispense Refill   amLODipine  (NORVASC ) 5 MG tablet Take 1 tablet (5 mg total) by mouth daily for 30 days. (Patient taking differently: Take 5 mg by mouth at bedtime.) 30 tablet 0   Ascorbic Acid (VITAMIN C) 1000 MG tablet Take 1,000 mg by mouth daily.     cholecalciferol (VITAMIN D3) 25 MCG (1000 UNIT) tablet Take 1,000 Units by mouth daily.     clopidogrel  (PLAVIX ) 75 MG tablet Take 1 tablet (75 mg total) by mouth daily. (Patient taking differently: Take 75 mg by mouth at bedtime.) 30 tablet 2   famotidine -calcium  carbonate-magnesium  hydroxide (PEPCID  COMPLETE) 10-800-165 MG chewable tablet Chew 1 tablet by mouth at bedtime.     Magnesium  250 MG TABS Take 250 mg by mouth daily.     Multiple Vitamin (MULTI VITAMIN) TABS Take 1 tablet by mouth daily. Centrum Silver     nortriptyline  (PAMELOR ) 10 MG capsule Take 1 capsule (10 mg total) by mouth at bedtime. 30 capsule 11   rosuvastatin  (CRESTOR ) 40 MG tablet TAKE 1 TABLET(40 MG) BY MOUTH DAILY 30 tablet 0   Study - OCEANIC-STROKE - asundexian 50 mg or placebo tablet (PI-Sethi)  Take 1 tablet (50 mg total) by mouth daily. For Investigational Use Only. Take at the same time each day (preferably in the morning). Tablet should be swallowed whole with water; it CANNOT be crushed or broken. Please contact [facility] for any questions or concerns regarding this medication. 196 tablet 0   No current facility-administered medications for this visit.    REVIEW OF SYSTEMS:  [X]  denotes positive finding, [ ]  denotes negative finding Cardiac  Comments:  Chest pain or chest pressure:    Shortness of breath upon exertion:    Short of breath when lying flat:    Irregular heart rhythm:        Vascular    Pain in calf, thigh, or  hip brought on by ambulation:    Pain in feet at night that wakes you up from your sleep:     Blood clot in your veins:    Leg swelling:         Pulmonary    Oxygen at home:    Productive cough:     Wheezing:         Neurologic    Sudden weakness in arms or legs:     Sudden numbness in arms or legs:     Sudden onset of difficulty speaking or slurred speech:    Temporary loss of vision in one eye:     Problems with dizziness:         Gastrointestinal    Blood in stool:     Vomited blood:         Genitourinary    Burning when urinating:     Blood in urine:        Psychiatric    Major depression:         Hematologic    Bleeding problems:    Problems with blood clotting too easily:        Skin    Rashes or ulcers:        Constitutional    Fever or chills:      PHYSICAL EXAM: There were no vitals filed for this visit.   GENERAL: The patient is a well-nourished male, in no acute distress. The vital signs are documented above. CARDIAC: There is a regular rate and rhythm.  VASCULAR:  Right neck incision healed NEUROLOGIC: No focal weakness or paresthesias are detected.  Cranial nerves II through XII grossly intact. SKIN: There are no ulcers or rashes noted. PSYCHIATRIC: The patient has a normal affect.  DATA:   Carotid duplex today shows widely patent right carotid stent with minimal stenosis in the left ICA  Assessment/Plan:  73 y.o. male with hx HTN and HLD who presents for 1 year follow-up for carotid artery surveillance.  He had a symptomatic 60% right ICA stenosis with ulcerated plaque and underwent right TCAR on 11/22/2021.  He did well postoperatively.    Young Hensen, MD [facility] [place] Office: [phone]

## 2023-09-13 DIAGNOSIS — K219 Gastro-esophageal reflux disease without esophagitis: Secondary | ICD-10-CM | POA: Diagnosis not present

## 2023-09-13 DIAGNOSIS — G4489 Other headache syndrome: Secondary | ICD-10-CM | POA: Diagnosis not present

## 2023-09-13 DIAGNOSIS — I10 Essential (primary) hypertension: Secondary | ICD-10-CM | POA: Diagnosis not present

## 2023-09-13 DIAGNOSIS — E7800 Pure hypercholesterolemia, unspecified: Secondary | ICD-10-CM | POA: Diagnosis not present

## 2023-09-13 DIAGNOSIS — G4733 Obstructive sleep apnea (adult) (pediatric): Secondary | ICD-10-CM | POA: Diagnosis not present

## 2023-09-13 DIAGNOSIS — I679 Cerebrovascular disease, unspecified: Secondary | ICD-10-CM | POA: Diagnosis not present

## 2023-09-20 DIAGNOSIS — M25562 Pain in left knee: Secondary | ICD-10-CM | POA: Diagnosis not present

## 2023-09-25 ENCOUNTER — Other Ambulatory Visit (HOSPITAL_COMMUNITY): Payer: Self-pay | Admitting: Pharmacist

## 2023-09-25 MED ORDER — STUDY - OCEANIC-STROKE - ASUNDEXIAN 50 MG OR PLACEBO TABLET (PI-SETHI): 1.0000 | ORAL_TABLET | Freq: Every day | ORAL | 0 refills | Status: DC

## 2023-09-27 DIAGNOSIS — M25512 Pain in left shoulder: Secondary | ICD-10-CM | POA: Diagnosis not present

## 2023-09-27 DIAGNOSIS — M25562 Pain in left knee: Secondary | ICD-10-CM | POA: Diagnosis not present

## 2023-10-02 NOTE — Progress Notes (Unsigned)
 Patient name: Ryan Franklin MRN: [id number] DOB: October 07, 1950 Sex: male  REASON FOR VISIT: 1 year follow-up, carotid surveilllance  HPI: Ryan Franklin is a 73 y.o. male with hx HTN and HLD who presents for 1 year follow-up for carotid artery surveillance.  He had a symptomatic 60% right ICA stenosis with ulcerated plaque and underwent right TCAR on 11/22/2021.  He did well postoperatively.    He remains on aspirin  Plavix  statin for his carotid stent.  He was also in the oceanic stroke study with neurology followed by Dr. Rosemarie.   Past Medical History:  Diagnosis Date   Arthritis    DVT (deep venous thrombosis) (HCC)    left leg   Hyperlipidemia    Hypertension    Peripheral vascular disease (HCC)    Stroke [facility])     Past Surgical History:  Procedure Laterality Date   BUBBLE STUDY  09/30/2021   Procedure: BUBBLE STUDY;  Surgeon: Mona Vinie BROCKS, MD;  Location: [facility] ENDOSCOPY;  Service: Cardiovascular;;   HERNIA REPAIR     TEE WITHOUT CARDIOVERSION N/A 09/30/2021   Procedure: TRANSESOPHAGEAL ECHOCARDIOGRAM (TEE);  Surgeon: Mona Vinie BROCKS, MD;  Location: [facility] ENDOSCOPY;  Service: Cardiovascular;  Laterality: N/A;   TRANSCAROTID ARTERY REVASCULARIZATION  Right 11/22/2021   Procedure: RIGHT TRANSCAROTID ARTERY REVASCULARIZATION;  Surgeon: Gretta Lonni PARAS, MD;  Location: [facility] OR;  Service: Vascular;  Laterality: Right;   ULTRASOUND GUIDANCE FOR VASCULAR ACCESS Left 11/22/2021   Procedure: ULTRASOUND GUIDANCE FOR VASCULAR ACCESS;  Surgeon: Gretta Lonni PARAS, MD;  Location: [facility] OR;  Service: Vascular;  Laterality: Left;    Family History  Problem Relation Age of Onset   Heart attack Mother    Heart disease Mother    Heart disease Father    Stroke Father     SOCIAL HISTORY: Social History   Tobacco Use   Smoking status: Former    Current packs/day: 0.00    Types: Cigarettes    Start date: 04/1976    Quit date: 04/1986    Years since quitting: 37.5   Smokeless  tobacco: Never  Substance Use Topics   Alcohol use: Yes    Alcohol/week: 7.0 standard drinks of alcohol    Types: 7 Glasses of wine per week    No Known Allergies  Current Outpatient Medications  Medication Sig Dispense Refill   amLODipine  (NORVASC ) 5 MG tablet Take 1 tablet (5 mg total) by mouth daily for 30 days. (Patient taking differently: Take 5 mg by mouth at bedtime.) 30 tablet 0   Ascorbic Acid (VITAMIN C) 1000 MG tablet Take 1,000 mg by mouth daily.     cholecalciferol (VITAMIN D3) 25 MCG (1000 UNIT) tablet Take 1,000 Units by mouth daily.     clopidogrel  (PLAVIX ) 75 MG tablet Take 1 tablet (75 mg total) by mouth daily. (Patient taking differently: Take 75 mg by mouth at bedtime.) 30 tablet 2   famotidine -calcium  carbonate-magnesium  hydroxide (PEPCID  COMPLETE) 10-800-165 MG chewable tablet Chew 1 tablet by mouth at bedtime.     Magnesium  250 MG TABS Take 250 mg by mouth daily.     Multiple Vitamin (MULTI VITAMIN) TABS Take 1 tablet by mouth daily. Centrum Silver     nortriptyline  (PAMELOR ) 10 MG capsule Take 1 capsule (10 mg total) by mouth at bedtime. 30 capsule 11   rosuvastatin  (CRESTOR ) 40 MG tablet TAKE 1 TABLET(40 MG) BY MOUTH DAILY 30 tablet 0   Study - OCEANIC-STROKE - asundexian 50 mg or placebo tablet (PI-Sethi)  Take 1 tablet (50 mg total) by mouth daily. For Investigational Use Only. Take at the same time each day (preferably in the morning). Tablet should be swallowed whole with water; it CANNOT be crushed or broken. Please contact [facility] for any questions or concerns regarding this medication. 196 tablet 0   No current facility-administered medications for this visit.    REVIEW OF SYSTEMS:  [X]  denotes positive finding, [ ]  denotes negative finding Cardiac  Comments:  Chest pain or chest pressure:    Shortness of breath upon exertion:    Short of breath when lying flat:    Irregular heart rhythm:        Vascular    Pain in calf, thigh, or  hip brought on by ambulation:    Pain in feet at night that wakes you up from your sleep:     Blood clot in your veins:    Leg swelling:         Pulmonary    Oxygen at home:    Productive cough:     Wheezing:         Neurologic    Sudden weakness in arms or legs:     Sudden numbness in arms or legs:     Sudden onset of difficulty speaking or slurred speech:    Temporary loss of vision in one eye:     Problems with dizziness:         Gastrointestinal    Blood in stool:     Vomited blood:         Genitourinary    Burning when urinating:     Blood in urine:        Psychiatric    Major depression:         Hematologic    Bleeding problems:    Problems with blood clotting too easily:        Skin    Rashes or ulcers:        Constitutional    Fever or chills:      PHYSICAL EXAM: There were no vitals filed for this visit.   GENERAL: The patient is a well-nourished male, in no acute distress. The vital signs are documented above. CARDIAC: There is a regular rate and rhythm.  VASCULAR:  Right neck incision healed NEUROLOGIC: No focal weakness or paresthesias are detected.  Cranial nerves II through XII grossly intact. SKIN: There are no ulcers or rashes noted. PSYCHIATRIC: The patient has a normal affect.  DATA:   Carotid duplex 09/12/23 shows widely patent right carotid stent with minimal stenosis in the left ICA  Assessment/Plan:  72 y.o. male with hx HTN and HLD who presents for 1 year follow-up for carotid artery surveillance.  He had a symptomatic 60% right ICA stenosis with ulcerated plaque and underwent right TCAR on 11/22/2021.    Carotid duplex shows widely patent right carotid stent.  He has no significant contralateral disease.  He will follow-up in 1 year with ongoing surveillance.   Lonni DOROTHA Gaskins, MD [facility] [place] Office: [phone]

## 2023-10-03 ENCOUNTER — Encounter: Payer: Self-pay | Admitting: Vascular Surgery

## 2023-10-03 ENCOUNTER — Ambulatory Visit: Attending: Vascular Surgery | Admitting: Vascular Surgery

## 2023-10-03 VITALS — BP 124/71 | HR 71 | Temp 98.6°F | Resp 18 | Ht 70.0 in | Wt 202.5 lb

## 2023-10-03 DIAGNOSIS — I6521 Occlusion and stenosis of right carotid artery: Secondary | ICD-10-CM

## 2023-10-03 DIAGNOSIS — Z114 Encounter for screening for human immunodeficiency virus [HIV]: Secondary | ICD-10-CM | POA: Diagnosis not present

## 2023-10-03 DIAGNOSIS — I6529 Occlusion and stenosis of unspecified carotid artery: Secondary | ICD-10-CM | POA: Diagnosis not present

## 2023-10-03 DIAGNOSIS — Z113 Encounter for screening for infections with a predominantly sexual mode of transmission: Secondary | ICD-10-CM | POA: Diagnosis not present

## 2023-10-08 NOTE — Progress Notes (Deleted)
 HPI M former smoker followed for OSA, complicated by Headache, HTN, Carotid Stenosis, CVA(MCA stroke), DVT, GERD, Hyperlipidemia,  HST 07/17/22- AHI 32.2/ hr, desaturation to 67%, body weight 217 lbs  ===============================================   03/14/23- 71yoM former smoker followed for OSA, complicated by Headache, HTN, Carotid Stenosis, CVA(MCA stroke), DVT, GERD, Hyperlipidemia,  HST 07/17/22- AHI 32.2/ hr, desaturation to 67%, body weight 217 lbs Body weight today- CPAP auto 10-20/ [employer]-  Download compliance-  -----Unable to use CPAP.  Not comfortable to sleep with.  Using Breath-right strips on nose - this helps some with breathing and sleep. Sleeps off back. Stays tired. Willing to look again at a fitted oral appliance. Not obese and may be candidate for Inspire. Had flu vax.  10/09/23- 32bnF former smoker followed for OSA, complicated by Headache, HTN, Carotid Stenosis, CVA(MCA stroke), DVT, GERD, Hyperlipidemia,  HST 07/17/22- AHI 32.2/ hr, desaturation to 67%, body weight 217 lbs Body weight today- Failed CPAP due to discomfort and we referred to Dr Micky in December.   ROS-see HPI    + = positive Constitutional:    weight loss, night sweats, fevers, chills, fatigue, lassitude. HEENT:    headaches, difficulty swallowing, tooth/dental problems, sore throat,       sneezing, itching, ear ache, +nasal congestion, post nasal drip, snoring CV:    chest pain, orthopnea, PND, swelling in lower extremities, anasarca,                                   dizziness, palpitations Resp:   shortness of breath with exertion or at rest.                productive cough,   non-productive cough, coughing up of blood.              change in color of mucus.  wheezing.   Skin:    rash or lesions. GI:  No-   heartburn, indigestion, abdominal pain, nausea, vomiting, diarrhea,                 change in bowel habits, loss of appetite GU: dysuria, change in color of urine, no urgency or frequency.    flank pain. MS:   joint pain, stiffness, decreased range of motion, back pain. Neuro-     nothing unusual Psych:  change in mood or affect.  depression or anxiety.   memory loss.  OBJ- Physical Exam General- Alert, Oriented, Affect-appropriate, Distress- none acute Skin- rash-none, lesions- none, excoriation- none Lymphadenopathy- none Head- atraumatic            Eyes- Gross vision intact, PERRLA, conjunctivae and secretions clear            Ears- Hearing, canals-normal            Nose- Clear, no-Septal dev, mucus, polyps, erosion, perforation             Throat- Mallampati III-IV , mucosa clear , drainage- none, tonsils- atrophic, + teeth Neck- flexible , trachea midline, no stridor , thyroid  nl, carotid no bruit Chest - symmetrical excursion , unlabored           Heart/CV- RRR , no murmur , no gallop  , no rub, nl s1 s2                           - JVD- none , edema- none, stasis changes- none,  varices+ calves           Lung- clear to P&A, wheeze- none, cough- none , dullness-none, rub- none           Chest wall-  Abd-  Br/ Gen/ Rectal- Not done, not indicated Extrem- cyanosis- none, clubbing, none, atrophy- none, strength- nl Neuro- grossly intact to observation

## 2023-10-10 ENCOUNTER — Ambulatory Visit: Payer: Medicare HMO | Admitting: Internal Medicine

## 2023-10-12 NOTE — Therapy (Signed)
 OUTPATIENT PHYSICAL THERAPY SHOULDER EVALUATION   Patient Name: Ryan Franklin MRN: [id number] DOB:1950/11/27, 73 y.o., male Today's Date: 10/16/2023  END OF SESSION:  PT End of Session - 10/16/23 1101     Visit Number 1    Date for PT Re-Evaluation 12/25/23    Authorization Type [employer]    PT Start Time 1100    PT Stop Time 1145    PT Time Calculation (min) 45 min          Past Medical History:  Diagnosis Date   Arthritis    Carotid artery occlusion    DVT (deep venous thrombosis) (HCC)    left leg   Hyperlipidemia    Hypertension    Peripheral vascular disease (HCC)    Stroke [facility])    Past Surgical History:  Procedure Laterality Date   BUBBLE STUDY  09/30/2021   Procedure: BUBBLE STUDY;  Surgeon: Aadil Sur Vinie BROCKS, MD;  Location: [facility] ENDOSCOPY;  Service: Cardiovascular;;   HERNIA REPAIR     TEE WITHOUT CARDIOVERSION N/A 09/30/2021   Procedure: TRANSESOPHAGEAL ECHOCARDIOGRAM (TEE);  Surgeon: Olie Dibert Vinie BROCKS, MD;  Location: [facility] ENDOSCOPY;  Service: Cardiovascular;  Laterality: N/A;   TRANSCAROTID ARTERY REVASCULARIZATION  Right 11/22/2021   Procedure: RIGHT TRANSCAROTID ARTERY REVASCULARIZATION;  Surgeon: Gretta Lonni PARAS, MD;  Location: [facility] OR;  Service: Vascular;  Laterality: Right;   ULTRASOUND GUIDANCE FOR VASCULAR ACCESS Left 11/22/2021   Procedure: ULTRASOUND GUIDANCE FOR VASCULAR ACCESS;  Surgeon: Gretta Lonni PARAS, MD;  Location: [facility] OR;  Service: Vascular;  Laterality: Left;   Patient Active Problem List   Diagnosis Date Noted   OSA (obstructive sleep apnea) 06/06/2022   Carotid artery stenosis 11/22/2021   Symptomatic carotid artery stenosis, right 11/16/2021   Acute right MCA stroke (HCC) 09/28/2021   CVA (cerebral vascular accident) (HCC) 09/27/2021   Hyperlipidemia    Essential hypertension 03/01/2021   Gastroesophageal reflux disease without esophagitis 03/01/2021   Chronic headache 03/01/2021   Low back pain 03/01/2021   Partial retinal artery  occlusion, right eye 03/01/2021   Pure hypercholesterolemia 03/01/2021   Retinal artery occlusion 03/01/2021   Carotid stenosis 03/01/2021    PCP: Elsie Lesches  REFERRING PROVIDER: Juliene Bailey  REFERRING DIAG:  [phone] (ICD-10-CM) - Pain in left shoulder    THERAPY DIAG:  Chronic left shoulder pain  Stiffness of left shoulder, not elsewhere classified  Impingement of left shoulder  Tendinosis of left rotator cuff  Rationale for Evaluation and Treatment: Rehabilitation  ONSET DATE: 08/21/23  SUBJECTIVE:  SUBJECTIVE STATEMENT: I work out with a Psychologist, educational and the shoulder is affecting some things. There are certain movements that hurt it. It's mainly overhead. Today the pain is not so bad.    PERTINENT HISTORY: 5/12- Additionally, he has been having for a few months some intermittent pain in the left shoulder with raising above his head. He does have some pain at night. It does come and go. It sounds more rotator cuff tendinopathic. We did provide him with home exercises. If this does not improve his symptoms he will follow-up for additional management. All questions were encouraged and answered.   6/18- In regards to the left shoulder I do believe this is more of some rotator cuff tendinosis. I will get him started in formal physical therapy. If this does not work we may consider further diagnostics or treatment. Follow-up in 4 to 6 weeks, earlier if needed. All questions were encouraged and answered.   PAIN:  Are you having pain? Yes: NPRS scale: 7/10 Pain location: L shoulder  Pain description: sharp, aches Aggravating factors: raising my arm up or out to side, free weights, lifting Relieving factors: Ibuprofen, ice   PRECAUTIONS: None  RED FLAGS: None   WEIGHT BEARING RESTRICTIONS:  No  FALLS:  Has patient fallen in last 6 months? No  LIVING ENVIRONMENT: Lives with: lives alone Lives in: House/apartment  OCCUPATION: Self employed   PLOF: Independent  PATIENT GOALS: be able to work out in my full extent   NEXT MD VISIT:   OBJECTIVE:  Note: Objective measures were completed at Evaluation unless otherwise noted.  DIAGNOSTIC FINDINGS:  N/A  PATIENT SURVEYS:  QuickDash 9.1/100 = 9.1%  COGNITION: Overall cognitive status: Within functional limits for tasks assessed     SENSATION: WFL  POSTURE: Slightly rounded shoulders  UPPER EXTREMITY ROM: WFL, but pain with ABD and IR    UPPER EXTREMITY MMT: 5/5 some pain with resisted abduction   SHOULDER SPECIAL TESTS: Impingement tests: Neer impingement test: negative, Hawkins/Kennedy impingement test: positive , and Painful arc test: positive   JOINT MOBILITY TESTING:  Full PROM                                                                                                                            TREATMENT DATE: 10/16/23 EVAL   PATIENT EDUCATION: Education details: POC, HEP, shoulder anatomy  Person educated: Patient Education method: Explanation and Demonstration Education comprehension: verbalized understanding and returned demonstration  HOME EXERCISE PROGRAM: Access Code: [email or web address] URL: [email or web address] Date: 10/16/2023 Prepared by: Almetta Fam  Exercises - Doorway Pec Stretch at 90 Degrees Abduction  - 1 x daily - 7 x weekly - 2 reps - 30 hold - Standing Shoulder Row with Anchored Resistance  - 1 x daily - 7 x weekly - 2 sets - 10 reps - Shoulder extension with resistance - Neutral  - 1 x daily - 7 x weekly - 2 sets - 10 reps - Standing Shoulder Horizontal  Abduction with Resistance  - 1 x daily - 7 x weekly - 2 sets - 10 reps - Shoulder Flexion Serratus Activation with Resistance  - 1 x daily - 7 x weekly - 2 sets - 10 reps - Standing Shoulder External Rotation with  Resistance  - 1 x daily - 7 x weekly - 2 sets - 10 reps  ASSESSMENT:  CLINICAL IMPRESSION: Patient is a 73 y.o. male who was seen today for physical therapy evaluation and treatment for L shoulder pain. He has pain with flexion and abduction motions. Also has some pain with resisted abduction. He presents with positive painful arc and Vonzell Berber tests, indicating possible impingement syndrome. His doctor thinks he may have some rotator cuff tendinosis. Patient works out with a Warehouse manager a week and reports she is very good about modifying exercises and stopping if he has pain. He will benefit from skilled PT to address his shoulder pain with modalities, postural strengthening, and stretching to allow him to continue with his training without pain.   OBJECTIVE IMPAIRMENTS: impaired UE functional use, improper body mechanics, postural dysfunction, and pain.   ACTIVITY LIMITATIONS: carrying, lifting, and reach over head  PARTICIPATION LIMITATIONS: cleaning, yard work, and gym  REHAB POTENTIAL: Good  CLINICAL DECISION MAKING: Stable/uncomplicated  EVALUATION COMPLEXITY: Low  GOALS: Goals reviewed with patient? Yes  SHORT TERM GOALS: Target date: 11/20/23  Patient will be independent with initial HEP.  Baseline:  Goal status: INITIAL   LONG TERM GOALS: Target date: 12/25/23  Patient will be independent with advanced/ongoing HEP to improve outcomes and carryover.  Baseline:  Goal status: INITIAL  2.  Patient will report 75% improvement in L shoulder pain to improve QOL.  Baseline: 7/10 Goal status: INITIAL  3.  Patient will be able to work out at the gym without pain in shoulder  Baseline:  Goal status: INITIAL  4.  Patient will demonstrate negative shoulder impingement testing Baseline: + painful arc and Hawkins Goal status: INITIAL   PLAN:  PT FREQUENCY: 1-2x/week  PT DURATION: 10 weeks  PLANNED INTERVENTIONS: 97110-Therapeutic exercises, 97530- Therapeutic  activity, 97112- Neuromuscular re-education, 97535- Self Care, 02859- Manual therapy, 97016- Vasopneumatic device, [id number]- Ultrasound, [id number]- Ionotophoresis 4mg /ml Dexamethasone , 79439 (1-2 muscles), 20561 (3+ muscles)- Dry Needling, Patient/Family education, Taping, Joint mobilization, Joint manipulation, Spinal manipulation, Spinal mobilization, Cryotherapy, and Moist heat  PLAN FOR NEXT SESSION: shoulder/postural strengthening, can try US  or ionto for pain    Almetta Fam, PT 10/16/2023, 11:41 AM

## 2023-10-16 ENCOUNTER — Ambulatory Visit: Attending: Sports Medicine

## 2023-10-16 DIAGNOSIS — M25512 Pain in left shoulder: Secondary | ICD-10-CM | POA: Diagnosis not present

## 2023-10-16 DIAGNOSIS — M67814 Other specified disorders of tendon, left shoulder: Secondary | ICD-10-CM | POA: Diagnosis not present

## 2023-10-16 DIAGNOSIS — G8929 Other chronic pain: Secondary | ICD-10-CM | POA: Diagnosis not present

## 2023-10-16 DIAGNOSIS — M25812 Other specified joint disorders, left shoulder: Secondary | ICD-10-CM | POA: Insufficient documentation

## 2023-10-16 DIAGNOSIS — M25612 Stiffness of left shoulder, not elsewhere classified: Secondary | ICD-10-CM | POA: Insufficient documentation

## 2023-10-18 ENCOUNTER — Ambulatory Visit: Admitting: Physical Therapy

## 2023-10-18 ENCOUNTER — Encounter: Payer: Self-pay | Admitting: Physical Therapy

## 2023-10-18 DIAGNOSIS — G8929 Other chronic pain: Secondary | ICD-10-CM | POA: Diagnosis not present

## 2023-10-18 DIAGNOSIS — M25512 Pain in left shoulder: Secondary | ICD-10-CM | POA: Diagnosis not present

## 2023-10-18 DIAGNOSIS — M25612 Stiffness of left shoulder, not elsewhere classified: Secondary | ICD-10-CM | POA: Diagnosis not present

## 2023-10-18 DIAGNOSIS — M67814 Other specified disorders of tendon, left shoulder: Secondary | ICD-10-CM | POA: Diagnosis not present

## 2023-10-18 DIAGNOSIS — M25812 Other specified joint disorders, left shoulder: Secondary | ICD-10-CM

## 2023-10-18 NOTE — Therapy (Signed)
 OUTPATIENT PHYSICAL THERAPY SHOULDER TREATMENT    Patient Name: Ryan Franklin MRN: [id number] DOB:[birth year], 73 y.o., male Today's Date: 10/18/2023  END OF SESSION:  PT End of Session - 10/18/23 1428     Visit Number 2    Date for PT Re-Evaluation 12/25/23    Authorization Type [employer]    PT Start Time 1355    PT Stop Time 1426    PT Time Calculation (min) 31 min    Activity Tolerance Patient tolerated treatment well    Behavior During Therapy [facility] for tasks assessed/performed           Past Medical History:  Diagnosis Date   Arthritis    Carotid artery occlusion    DVT (deep venous thrombosis) (HCC)    left leg   Hyperlipidemia    Hypertension    Peripheral vascular disease (HCC)    Stroke [facility])    Past Surgical History:  Procedure Laterality Date   BUBBLE STUDY  09/30/2021   Procedure: BUBBLE STUDY;  Surgeon: Mona Vinie BROCKS, MD;  Location: [facility] ENDOSCOPY;  Service: Cardiovascular;;   HERNIA REPAIR     TEE WITHOUT CARDIOVERSION N/A 09/30/2021   Procedure: TRANSESOPHAGEAL ECHOCARDIOGRAM (TEE);  Surgeon: Mona Vinie BROCKS, MD;  Location: [facility] ENDOSCOPY;  Service: Cardiovascular;  Laterality: N/A;   TRANSCAROTID ARTERY REVASCULARIZATION  Right 11/22/2021   Procedure: RIGHT TRANSCAROTID ARTERY REVASCULARIZATION;  Surgeon: Gretta Lonni PARAS, MD;  Location: [facility] OR;  Service: Vascular;  Laterality: Right;   ULTRASOUND GUIDANCE FOR VASCULAR ACCESS Left 11/22/2021   Procedure: ULTRASOUND GUIDANCE FOR VASCULAR ACCESS;  Surgeon: Gretta Lonni PARAS, MD;  Location: [facility] OR;  Service: Vascular;  Laterality: Left;   Patient Active Problem List   Diagnosis Date Noted   OSA (obstructive sleep apnea) 06/06/2022   Carotid artery stenosis 11/22/2021   Symptomatic carotid artery stenosis, right 11/16/2021   Acute right MCA stroke (HCC) 09/28/2021   CVA (cerebral vascular accident) (HCC) 09/27/2021   Hyperlipidemia    Essential hypertension 03/01/2021   Gastroesophageal reflux  disease without esophagitis 03/01/2021   Chronic headache 03/01/2021   Low back pain 03/01/2021   Partial retinal artery occlusion, right eye 03/01/2021   Pure hypercholesterolemia 03/01/2021   Retinal artery occlusion 03/01/2021   Carotid stenosis 03/01/2021    PCP: Elsie Lesches  REFERRING PROVIDER: Juliene Bailey  REFERRING DIAG:  [phone] (ICD-10-CM) - Pain in left shoulder    THERAPY DIAG:  Chronic left shoulder pain  Stiffness of left shoulder, not elsewhere classified  Impingement of left shoulder  Tendinosis of left rotator cuff  Rationale for Evaluation and Treatment: Rehabilitation  ONSET DATE: 08/21/23  SUBJECTIVE:  SUBJECTIVE STATEMENT:   Doing well, no big changes since eval. Shoulder pains come and go, some days are really bad/others not so much, sleeping on it can flare it up   EVAL: I work out with a trainer and the shoulder is affecting some things. There are certain movements that hurt it. It's mainly overhead. Today the pain is not so bad.    PERTINENT HISTORY: 5/12- Additionally, he has been having for a few months some intermittent pain in the left shoulder with raising above his head. He does have some pain at night. It does come and go. It sounds more rotator cuff tendinopathic. We did provide him with home exercises. If this does not improve his symptoms he will follow-up for additional management. All questions were encouraged and answered.   6/18- In regards to the left shoulder I do believe this is more of some rotator cuff tendinosis. I will get him started in formal physical therapy. If this does not work we may consider further diagnostics or treatment. Follow-up in 4 to 6 weeks, earlier if needed. All questions were encouraged and answered.   PAIN:  Are you having  pain? Yes: NPRS scale: 2/10 Pain location: L shoulder  Pain description: sharp  Aggravating factors: raising my arm up or out to side, free weights, lifting, direction of exercise makes a lot of difference  Relieving factors: Ibuprofen, ice   PRECAUTIONS: None  RED FLAGS: None   WEIGHT BEARING RESTRICTIONS: No  FALLS:  Has patient fallen in last 6 months? No  LIVING ENVIRONMENT: Lives with: lives alone Lives in: House/apartment  OCCUPATION: Self employed   PLOF: Independent  PATIENT GOALS: be able to work out in my full extent   NEXT MD VISIT:   OBJECTIVE:  Note: Objective measures were completed at Evaluation unless otherwise noted.  DIAGNOSTIC FINDINGS:  N/A  PATIENT SURVEYS:  QuickDash 9.1/100 = 9.1%  COGNITION: Overall cognitive status: Within functional limits for tasks assessed     SENSATION: WFL  POSTURE: Slightly rounded shoulders  UPPER EXTREMITY ROM: WFL, but pain with ABD and IR    UPPER EXTREMITY MMT: 5/5 some pain with resisted abduction   SHOULDER SPECIAL TESTS: Impingement tests: Neer impingement test: negative, Hawkins/Kennedy impingement test: positive , and Painful arc test: positive   JOINT MOBILITY TESTING:  Full PROM                                                                                                                            TREATMENT DATE:    10/18/23  UBE L5 x3 min forward/backward  Prone blackburn 6 x12 each 0# 3 way reaches on wall red TB x10 B limited by shoulder pain with exercise  Corner stretch 3x30 seconds  Thoracic 3D excursions x10 all directions   Ionto patch #1 anterior L shoulder (4 hour patch/1.71mL dex), education on sx that would indicate removing patch early, wear/removal time     10/16/23 EVAL  PATIENT EDUCATION: Education details: POC, HEP, shoulder anatomy  Person educated: Patient Education method: Medical illustrator Education comprehension: verbalized understanding and  returned demonstration  HOME EXERCISE PROGRAM: Access Code: [email or web address] URL: [email or web address] Date: 10/16/2023 Prepared by: Almetta Fam  Exercises - Doorway Pec Stretch at 90 Degrees Abduction  - 1 x daily - 7 x weekly - 2 reps - 30 hold - Standing Shoulder Row with Anchored Resistance  - 1 x daily - 7 x weekly - 2 sets - 10 reps - Shoulder extension with resistance - Neutral  - 1 x daily - 7 x weekly - 2 sets - 10 reps - Standing Shoulder Horizontal Abduction with Resistance  - 1 x daily - 7 x weekly - 2 sets - 10 reps - Shoulder Flexion Serratus Activation with Resistance  - 1 x daily - 7 x weekly - 2 sets - 10 reps - Standing Shoulder External Rotation with Resistance  - 1 x daily - 7 x weekly - 2 sets - 10 reps  ASSESSMENT:  CLINICAL IMPRESSION:  Arrives doing OK, we warmed up on the UBE then focused on periscapular and rotator cuff strengthening and retraining. Did OK, we will progress interventions as tolerated moving forward.    EVAL: Patient is a 73 y.o. male who was seen today for physical therapy evaluation and treatment for L shoulder pain. He has pain with flexion and abduction motions. Also has some pain with resisted abduction. He presents with positive painful arc and Vonzell Berber tests, indicating possible impingement syndrome. His doctor thinks he may have some rotator cuff tendinosis. Patient works out with a Warehouse manager a week and reports she is very good about modifying exercises and stopping if he has pain. He will benefit from skilled PT to address his shoulder pain with modalities, postural strengthening, and stretching to allow him to continue with his training without pain.   OBJECTIVE IMPAIRMENTS: impaired UE functional use, improper body mechanics, postural dysfunction, and pain.   ACTIVITY LIMITATIONS: carrying, lifting, and reach over head  PARTICIPATION LIMITATIONS: cleaning, yard work, and gym  REHAB POTENTIAL: Good  CLINICAL DECISION  MAKING: Stable/uncomplicated  EVALUATION COMPLEXITY: Low  GOALS: Goals reviewed with patient? Yes  SHORT TERM GOALS: Target date: 11/20/23  Patient will be independent with initial HEP.  Baseline:  Goal status: INITIAL   LONG TERM GOALS: Target date: 12/25/23  Patient will be independent with advanced/ongoing HEP to improve outcomes and carryover.  Baseline:  Goal status: INITIAL  2.  Patient will report 75% improvement in L shoulder pain to improve QOL.  Baseline: 7/10 Goal status: INITIAL  3.  Patient will be able to work out at the gym without pain in shoulder  Baseline:  Goal status: INITIAL  4.  Patient will demonstrate negative shoulder impingement testing Baseline: + painful arc and Hawkins Goal status: INITIAL   PLAN:  PT FREQUENCY: 1-2x/week  PT DURATION: 10 weeks  PLANNED INTERVENTIONS: 97110-Therapeutic exercises, 97530- Therapeutic activity, [id number]- Neuromuscular re-education, 97535- Self Care, 02859- Manual therapy, 97016- Vasopneumatic device, [id number]- Ultrasound, [id number]- Ionotophoresis 4mg /ml Dexamethasone , 79439 (1-2 muscles), 20561 (3+ muscles)- Dry Needling, Patient/Family education, Taping, Joint mobilization, Joint manipulation, Spinal manipulation, Spinal mobilization, Cryotherapy, and Moist heat  PLAN FOR NEXT SESSION: progressive shoulder/postural strengthening, continue ionto if it was helpful   Josette Rough, PT, DPT 10/18/23 2:29 PM

## 2023-10-31 ENCOUNTER — Ambulatory Visit: Admitting: Physical Therapy

## 2023-10-31 DIAGNOSIS — M25612 Stiffness of left shoulder, not elsewhere classified: Secondary | ICD-10-CM

## 2023-10-31 DIAGNOSIS — G8929 Other chronic pain: Secondary | ICD-10-CM

## 2023-10-31 DIAGNOSIS — M25512 Pain in left shoulder: Secondary | ICD-10-CM | POA: Diagnosis not present

## 2023-10-31 DIAGNOSIS — M25812 Other specified joint disorders, left shoulder: Secondary | ICD-10-CM | POA: Diagnosis not present

## 2023-10-31 DIAGNOSIS — M67814 Other specified disorders of tendon, left shoulder: Secondary | ICD-10-CM

## 2023-10-31 NOTE — Therapy (Signed)
 OUTPATIENT PHYSICAL THERAPY SHOULDER TREATMENT    Patient Name: Ryan Franklin MRN: [id number] DOB:[birth year], 73 y.o., male Today's Date: 10/31/2023  END OF SESSION:  PT End of Session - 10/31/23 0930     Visit Number 3    Date for PT Re-Evaluation 12/25/23    Authorization Type [employer]    PT Start Time 0930    PT Stop Time 1015    PT Time Calculation (min) 45 min           Past Medical History:  Diagnosis Date   Arthritis    Carotid artery occlusion    DVT (deep venous thrombosis) (HCC)    left leg   Hyperlipidemia    Hypertension    Peripheral vascular disease (HCC)    Stroke [facility])    Past Surgical History:  Procedure Laterality Date   BUBBLE STUDY  09/30/2021   Procedure: BUBBLE STUDY;  Surgeon: Mona Vinie BROCKS, MD;  Location: [facility] ENDOSCOPY;  Service: Cardiovascular;;   HERNIA REPAIR     TEE WITHOUT CARDIOVERSION N/A 09/30/2021   Procedure: TRANSESOPHAGEAL ECHOCARDIOGRAM (TEE);  Surgeon: Mona Vinie BROCKS, MD;  Location: [facility] ENDOSCOPY;  Service: Cardiovascular;  Laterality: N/A;   TRANSCAROTID ARTERY REVASCULARIZATION  Right 11/22/2021   Procedure: RIGHT TRANSCAROTID ARTERY REVASCULARIZATION;  Surgeon: Gretta Lonni PARAS, MD;  Location: [facility] OR;  Service: Vascular;  Laterality: Right;   ULTRASOUND GUIDANCE FOR VASCULAR ACCESS Left 11/22/2021   Procedure: ULTRASOUND GUIDANCE FOR VASCULAR ACCESS;  Surgeon: Gretta Lonni PARAS, MD;  Location: [facility] OR;  Service: Vascular;  Laterality: Left;   Patient Active Problem List   Diagnosis Date Noted   OSA (obstructive sleep apnea) 06/06/2022   Carotid artery stenosis 11/22/2021   Symptomatic carotid artery stenosis, right 11/16/2021   Acute right MCA stroke (HCC) 09/28/2021   CVA (cerebral vascular accident) (HCC) 09/27/2021   Hyperlipidemia    Essential hypertension 03/01/2021   Gastroesophageal reflux disease without esophagitis 03/01/2021   Chronic headache 03/01/2021   Low back pain 03/01/2021   Partial retinal  artery occlusion, right eye 03/01/2021   Pure hypercholesterolemia 03/01/2021   Retinal artery occlusion 03/01/2021   Carotid stenosis 03/01/2021    PCP: Elsie Lesches  REFERRING PROVIDER: Juliene Bailey  REFERRING DIAG:  [phone] (ICD-10-CM) - Pain in left shoulder    THERAPY DIAG:  Chronic left shoulder pain  Stiffness of left shoulder, not elsewhere classified  Impingement of left shoulder  Tendinosis of left rotator cuff  Rationale for Evaluation and Treatment: Rehabilitation  ONSET DATE: 08/21/23  SUBJECTIVE:  SUBJECTIVE STATEMENT: Getting better, patch was good. A lot better, doing ex and stretches    EVAL: I work out with a trainer and the shoulder is affecting some things. There are certain movements that hurt it. It's mainly overhead. Today the pain is not so bad.    PERTINENT HISTORY: 5/12- Additionally, he has been having for a few months some intermittent pain in the left shoulder with raising above his head. He does have some pain at night. It does come and go. It sounds more rotator cuff tendinopathic. We did provide him with home exercises. If this does not improve his symptoms he will follow-up for additional management. All questions were encouraged and answered.   6/18- In regards to the left shoulder I do believe this is more of some rotator cuff tendinosis. I will get him started in formal physical therapy. If this does not work we may consider further diagnostics or treatment. Follow-up in 4 to 6 weeks, earlier if needed. All questions were encouraged and answered.   PAIN:  Are you having pain? Yes: NPRS scale: 0/10 Pain location: L shoulder  Pain description: sharp  Aggravating factors: raising my arm up or out to side, free weights, lifting, direction of exercise makes a lot  of difference  Relieving factors: Ibuprofen, ice   PRECAUTIONS: None  RED FLAGS: None   WEIGHT BEARING RESTRICTIONS: No  FALLS:  Has patient fallen in last 6 months? No  LIVING ENVIRONMENT: Lives with: lives alone Lives in: House/apartment  OCCUPATION: Self employed   PLOF: Independent  PATIENT GOALS: be able to work out in my full extent   NEXT MD VISIT:   OBJECTIVE:  Note: Objective measures were completed at Evaluation unless otherwise noted.  DIAGNOSTIC FINDINGS:  N/A  PATIENT SURVEYS:  QuickDash 9.1/100 = 9.1%  COGNITION: Overall cognitive status: Within functional limits for tasks assessed     SENSATION: WFL  POSTURE: Slightly rounded shoulders  UPPER EXTREMITY ROM: WFL, but pain with ABD and IR    UPPER EXTREMITY MMT: 5/5 some pain with resisted abduction   SHOULDER SPECIAL TESTS: Impingement tests: Neer impingement test: negative, Hawkins/Kennedy impingement test: positive , and Painful arc test: positive   JOINT MOBILITY TESTING:  Full PROM                                                                                                                            TREATMENT DATE:   10/31/23 UBE 3 min each way L 3 4# empty can 2 sets 10 Left 4# PNF 2 sets 10 Left 4# seated horz abd 2 sets 10 Left  Green tband IR and ER 2 sets 10 Left  Red tband 3 way stab 10 x BIL- fatigue 4# 4pt rhy stab 10 x - pain with position 3-abd 4# shld flex into abd 10 x, reverse 10 x each- pain with abd Ionto patch #1 anterior L shoulder (4 hour patch/1.23mL dex), reviewed on sx that would  indicate removing patch early, wear/removal time     10/18/23  UBE L5 x3 min forward/backward  Prone blackburn 6 x12 each 0# 3 way reaches on wall red TB x10 B limited by shoulder pain with exercise  Corner stretch 3x30 seconds  Thoracic 3D excursions x10 all directions   Ionto patch #1 anterior L shoulder (4 hour patch/1.43mL dex), education on sx that would indicate  removing patch early, wear/removal time     10/16/23 EVAL   PATIENT EDUCATION: Education details: POC, HEP, shoulder anatomy  Person educated: Patient Education method: Explanation and Demonstration Education comprehension: verbalized understanding and returned demonstration  HOME EXERCISE PROGRAM: Access Code: [email or web address] URL: [email or web address] Date: 10/16/2023 Prepared by: Almetta Fam  Exercises - Doorway Pec Stretch at 90 Degrees Abduction  - 1 x daily - 7 x weekly - 2 reps - 30 hold - Standing Shoulder Row with Anchored Resistance  - 1 x daily - 7 x weekly - 2 sets - 10 reps - Shoulder extension with resistance - Neutral  - 1 x daily - 7 x weekly - 2 sets - 10 reps - Standing Shoulder Horizontal Abduction with Resistance  - 1 x daily - 7 x weekly - 2 sets - 10 reps - Shoulder Flexion Serratus Activation with Resistance  - 1 x daily - 7 x weekly - 2 sets - 10 reps - Standing Shoulder External Rotation with Resistance  - 1 x daily - 7 x weekly - 2 sets - 10 reps  ASSESSMENT:  CLINICAL IMPRESSION:  Spent a lot of time during session educ on better and correct positioning with ex. Consistant soreness/pain with abd and shld fatigue with many ex.  EVAL: Patient is a 73 y.o. male who was seen today for physical therapy evaluation and treatment for L shoulder pain. He has pain with flexion and abduction motions. Also has some pain with resisted abduction. He presents with positive painful arc and Vonzell Berber tests, indicating possible impingement syndrome. His doctor thinks he may have some rotator cuff tendinosis. Patient works out with a Warehouse manager a week and reports she is very good about modifying exercises and stopping if he has pain. He will benefit from skilled PT to address his shoulder pain with modalities, postural strengthening, and stretching to allow him to continue with his training without pain.   OBJECTIVE IMPAIRMENTS: impaired UE functional use,  improper body mechanics, postural dysfunction, and pain.   ACTIVITY LIMITATIONS: carrying, lifting, and reach over head  PARTICIPATION LIMITATIONS: cleaning, yard work, and gym  REHAB POTENTIAL: Good  CLINICAL DECISION MAKING: Stable/uncomplicated  EVALUATION COMPLEXITY: Low  GOALS: Goals reviewed with patient? Yes  SHORT TERM GOALS: Target date: 11/20/23  Patient will be independent with initial HEP.  Baseline:  Goal status: MET 10/31/23   LONG TERM GOALS: Target date: 12/25/23  Patient will be independent with advanced/ongoing HEP to improve outcomes and carryover.  Baseline:  Goal status: INITIAL  2.  Patient will report 75% improvement in L shoulder pain to improve QOL.  Baseline: 7/10 Goal status: progressing 10/31/23  3.  Patient will be able to work out at the gym without pain in shoulder  Baseline:  Goal status: progressing 10/31/23  4.  Patient will demonstrate negative shoulder impingement testing Baseline: + painful arc and Hawkins Goal status: progressing 10/31/23   PLAN:  PT FREQUENCY: 1-2x/week  PT DURATION: 10 weeks  PLANNED INTERVENTIONS: 97110-Therapeutic exercises, 97530- Therapeutic activity, 97112- Neuromuscular re-education, 97535- Self Care, 02859- Manual therapy, 97016- Vasopneumatic  device, [id number]- Ultrasound, 02966- Ionotophoresis 4mg /ml Dexamethasone , 20560 (1-2 muscles), 20561 (3+ muscles)- Dry Needling, Patient/Family education, Taping, Joint mobilization, Joint manipulation, Spinal manipulation, Spinal mobilization, Cryotherapy, and Moist heat  PLAN FOR NEXT SESSION: progressive shoulder/postural strengthening, continue ionto   Ruqayya Ventress , PTA 10/31/23 9:31 AM

## 2023-11-01 DIAGNOSIS — M25512 Pain in left shoulder: Secondary | ICD-10-CM | POA: Diagnosis not present

## 2023-11-01 DIAGNOSIS — K921 Melena: Secondary | ICD-10-CM | POA: Diagnosis not present

## 2023-11-01 DIAGNOSIS — Z7901 Long term (current) use of anticoagulants: Secondary | ICD-10-CM | POA: Diagnosis not present

## 2023-11-02 ENCOUNTER — Ambulatory Visit: Admitting: Physical Therapy

## 2023-11-02 DIAGNOSIS — M67814 Other specified disorders of tendon, left shoulder: Secondary | ICD-10-CM

## 2023-11-02 DIAGNOSIS — M25512 Pain in left shoulder: Secondary | ICD-10-CM | POA: Diagnosis not present

## 2023-11-02 DIAGNOSIS — G8929 Other chronic pain: Secondary | ICD-10-CM | POA: Diagnosis not present

## 2023-11-02 DIAGNOSIS — M25812 Other specified joint disorders, left shoulder: Secondary | ICD-10-CM

## 2023-11-02 DIAGNOSIS — M25612 Stiffness of left shoulder, not elsewhere classified: Secondary | ICD-10-CM

## 2023-11-02 NOTE — Therapy (Signed)
 OUTPATIENT PHYSICAL THERAPY SHOULDER TREATMENT    Patient Name: Ryan Franklin MRN: [id number] DOB:June 09, 1950, 73 y.o., male Today's Date: 11/02/2023  END OF SESSION:  PT End of Session - 11/02/23 0930     Visit Number 4    Date for PT Re-Evaluation 12/25/23    Authorization Type [employer]    PT Start Time 0930    PT Stop Time 1015    PT Time Calculation (min) 45 min           Past Medical History:  Diagnosis Date   Arthritis    Carotid artery occlusion    DVT (deep venous thrombosis) (HCC)    left leg   Hyperlipidemia    Hypertension    Peripheral vascular disease (HCC)    Stroke [facility])    Past Surgical History:  Procedure Laterality Date   BUBBLE STUDY  09/30/2021   Procedure: BUBBLE STUDY;  Surgeon: Mona Vinie BROCKS, MD;  Location: [facility] ENDOSCOPY;  Service: Cardiovascular;;   HERNIA REPAIR     TEE WITHOUT CARDIOVERSION N/A 09/30/2021   Procedure: TRANSESOPHAGEAL ECHOCARDIOGRAM (TEE);  Surgeon: Mona Vinie BROCKS, MD;  Location: [facility] ENDOSCOPY;  Service: Cardiovascular;  Laterality: N/A;   TRANSCAROTID ARTERY REVASCULARIZATION  Right 11/22/2021   Procedure: RIGHT TRANSCAROTID ARTERY REVASCULARIZATION;  Surgeon: Gretta Lonni PARAS, MD;  Location: [facility] OR;  Service: Vascular;  Laterality: Right;   ULTRASOUND GUIDANCE FOR VASCULAR ACCESS Left 11/22/2021   Procedure: ULTRASOUND GUIDANCE FOR VASCULAR ACCESS;  Surgeon: Gretta Lonni PARAS, MD;  Location: [facility] OR;  Service: Vascular;  Laterality: Left;   Patient Active Problem List   Diagnosis Date Noted   OSA (obstructive sleep apnea) 06/06/2022   Carotid artery stenosis 11/22/2021   Symptomatic carotid artery stenosis, right 11/16/2021   Acute right MCA stroke (HCC) 09/28/2021   CVA (cerebral vascular accident) (HCC) 09/27/2021   Hyperlipidemia    Essential hypertension 03/01/2021   Gastroesophageal reflux disease without esophagitis 03/01/2021   Chronic headache 03/01/2021   Low back pain 03/01/2021   Partial retinal  artery occlusion, right eye 03/01/2021   Pure hypercholesterolemia 03/01/2021   Retinal artery occlusion 03/01/2021   Carotid stenosis 03/01/2021    PCP: Elsie Lesches  REFERRING PROVIDER: Juliene Bailey  REFERRING DIAG:  [phone] (ICD-10-CM) - Pain in left shoulder    THERAPY DIAG:  Chronic left shoulder pain  Stiffness of left shoulder, not elsewhere classified  Impingement of left shoulder  Tendinosis of left rotator cuff  Rationale for Evaluation and Treatment: Rehabilitation  ONSET DATE: 08/21/23  SUBJECTIVE:  SUBJECTIVE STATEMENT: Getting better, patch is helping. At gym this morning but did legs    EVAL: I work out with a trainer and the shoulder is affecting some things. There are certain movements that hurt it. It's mainly overhead. Today the pain is not so bad.    PERTINENT HISTORY: 5/12- Additionally, he has been having for a few months some intermittent pain in the left shoulder with raising above his head. He does have some pain at night. It does come and go. It sounds more rotator cuff tendinopathic. We did provide him with home exercises. If this does not improve his symptoms he will follow-up for additional management. All questions were encouraged and answered.   6/18- In regards to the left shoulder I do believe this is more of some rotator cuff tendinosis. I will get him started in formal physical therapy. If this does not work we may consider further diagnostics or treatment. Follow-up in 4 to 6 weeks, earlier if needed. All questions were encouraged and answered.   PAIN:  Are you having pain? Yes: NPRS scale: 0/10 Pain location: L shoulder  Pain description: sharp  Aggravating factors: raising my arm up or out to side, free weights, lifting, direction of exercise makes a lot  of difference  Relieving factors: Ibuprofen, ice   PRECAUTIONS: None  RED FLAGS: None   WEIGHT BEARING RESTRICTIONS: No  FALLS:  Has patient fallen in last 6 months? No  LIVING ENVIRONMENT: Lives with: lives alone Lives in: House/apartment  OCCUPATION: Self employed   PLOF: Independent  PATIENT GOALS: be able to work out in my full extent   NEXT MD VISIT:   OBJECTIVE:  Note: Objective measures were completed at Evaluation unless otherwise noted.  DIAGNOSTIC FINDINGS:  N/A  PATIENT SURVEYS:  QuickDash 9.1/100 = 9.1%  COGNITION: Overall cognitive status: Within functional limits for tasks assessed     SENSATION: WFL  POSTURE: Slightly rounded shoulders  UPPER EXTREMITY ROM: WFL, but pain with ABD and IR    UPPER EXTREMITY MMT: 5/5 some pain with resisted abduction   SHOULDER SPECIAL TESTS: Impingement tests: Neer impingement test: negative, Hawkins/Kennedy impingement test: positive , and Painful arc test: positive   JOINT MOBILITY TESTING:  Full PROM                                                                                                                            TREATMENT DATE:   11/02/23 UBE L 3 each way 4# seated row ,shld ext and horz abd 2 sets 10 4# standing shld flex,abd ( 2 angles ) 10 each 4# postural wall angles 5 x 4# empty can 2 sets 10 left 4# PNF 2 sets 10 Left Cable pulley IR 15# 2 sets 10, ER 10# 2 sets 10 4# 4 pt rhy stab 10 x each FULL and = ROM. MMT WFLS with pain on Left 4# flex into abd 10 x then reverse 10 x PROM with  end range stretch for pect and ER Educ in dorrway stretching Educ in avoiding chesy press,OH and push ups for now at gym Ionto patch #3 anterior L shoulder     10/31/23 UBE 3 min each way L 3 4# empty can 2 sets 10 Left 4# PNF 2 sets 10 Left 4# seated horz abd 2 sets 10 Left  Green tband IR and ER 2 sets 10 Left  Red tband 3 way stab 10 x BIL- fatigue 4# 4pt rhy stab 10 x - pain with  position 3-abd 4# shld flex into abd 10 x, reverse 10 x each- pain with abd Ionto patch #2 anterior L shoulder (4 hour patch/1.74mL dex), reviewed on sx that would indicate removing patch early, wear/removal time     10/18/23  UBE L5 x3 min forward/backward  Prone blackburn 6 x12 each 0# 3 way reaches on wall red TB x10 B limited by shoulder pain with exercise  Corner stretch 3x30 seconds  Thoracic 3D excursions x10 all directions   Ionto patch #1 anterior L shoulder (4 hour patch/1.45mL dex), education on sx that would indicate removing patch early, wear/removal time     10/16/23 EVAL   PATIENT EDUCATION: Education details: POC, HEP, shoulder anatomy  Person educated: Patient Education method: Explanation and Demonstration Education comprehension: verbalized understanding and returned demonstration  HOME EXERCISE PROGRAM: Access Code: [email or web address] URL: [email or web address] Date: 10/16/2023 Prepared by: Almetta Fam  Exercises - Doorway Pec Stretch at 90 Degrees Abduction  - 1 x daily - 7 x weekly - 2 reps - 30 hold - Standing Shoulder Row with Anchored Resistance  - 1 x daily - 7 x weekly - 2 sets - 10 reps - Shoulder extension with resistance - Neutral  - 1 x daily - 7 x weekly - 2 sets - 10 reps - Standing Shoulder Horizontal Abduction with Resistance  - 1 x daily - 7 x weekly - 2 sets - 10 reps - Shoulder Flexion Serratus Activation with Resistance  - 1 x daily - 7 x weekly - 2 sets - 10 reps - Standing Shoulder External Rotation with Resistance  - 1 x daily - 7 x weekly - 2 sets - 10 reps  ASSESSMENT:  CLINICAL IMPRESSION: ROM WNLS and = . MMT WFLS with pain on left. End range tightness of ER- showed doorway stretch. Educ on ex to avoid and decrease strain in gym for now, until inflammation calms down. Porgressed ex with cuing for impingment.  EVAL: Patient is a 73 y.o. male who was seen today for physical therapy evaluation and treatment for L shoulder pain.  He has pain with flexion and abduction motions. Also has some pain with resisted abduction. He presents with positive painful arc and Vonzell Berber tests, indicating possible impingement syndrome. His doctor thinks he may have some rotator cuff tendinosis. Patient works out with a Warehouse manager a week and reports she is very good about modifying exercises and stopping if he has pain. He will benefit from skilled PT to address his shoulder pain with modalities, postural strengthening, and stretching to allow him to continue with his training without pain.   OBJECTIVE IMPAIRMENTS: impaired UE functional use, improper body mechanics, postural dysfunction, and pain.   ACTIVITY LIMITATIONS: carrying, lifting, and reach over head  PARTICIPATION LIMITATIONS: cleaning, yard work, and gym  REHAB POTENTIAL: Good  CLINICAL DECISION MAKING: Stable/uncomplicated  EVALUATION COMPLEXITY: Low  GOALS: Goals reviewed with patient? Yes  SHORT TERM GOALS: Target date: 11/20/23  Patient  will be independent with initial HEP.  Baseline:  Goal status: MET 10/31/23   LONG TERM GOALS: Target date: 12/25/23  Patient will be independent with advanced/ongoing HEP to improve outcomes and carryover.  Baseline:  Goal status: INITIAL  2.  Patient will report 75% improvement in L shoulder pain to improve QOL.  Baseline: 7/10 Goal status: progressing 10/31/23  3.  Patient will be able to work out at the gym without pain in shoulder  Baseline:  Goal status: progressing 10/31/23  4.  Patient will demonstrate negative shoulder impingement testing Baseline: + painful arc and Hawkins Goal status: progressing 10/31/23   PLAN:  PT FREQUENCY: 1-2x/week  PT DURATION: 10 weeks  PLANNED INTERVENTIONS: 97110-Therapeutic exercises, 97530- Therapeutic activity, 97112- Neuromuscular re-education, 97535- Self Care, 02859- Manual therapy, 97016- Vasopneumatic device, [id number]- Ultrasound, [id number]- Ionotophoresis 4mg /ml  Dexamethasone , 79439 (1-2 muscles), 20561 (3+ muscles)- Dry Needling, Patient/Family education, Taping, Joint mobilization, Joint manipulation, Spinal manipulation, Spinal mobilization, Cryotherapy, and Moist heat  PLAN FOR NEXT SESSION: progressive shoulder/postural strengthening, continue ionto   [employer] , PTA 11/02/23 9:30 AM

## 2023-11-07 ENCOUNTER — Encounter: Payer: Self-pay | Admitting: Physical Therapy

## 2023-11-07 ENCOUNTER — Ambulatory Visit: Admitting: Physical Therapy

## 2023-11-07 DIAGNOSIS — M25812 Other specified joint disorders, left shoulder: Secondary | ICD-10-CM

## 2023-11-07 DIAGNOSIS — M67814 Other specified disorders of tendon, left shoulder: Secondary | ICD-10-CM | POA: Diagnosis not present

## 2023-11-07 DIAGNOSIS — G8929 Other chronic pain: Secondary | ICD-10-CM | POA: Diagnosis not present

## 2023-11-07 DIAGNOSIS — M25612 Stiffness of left shoulder, not elsewhere classified: Secondary | ICD-10-CM | POA: Diagnosis not present

## 2023-11-07 DIAGNOSIS — M25512 Pain in left shoulder: Secondary | ICD-10-CM | POA: Diagnosis not present

## 2023-11-07 NOTE — Therapy (Signed)
 OUTPATIENT PHYSICAL THERAPY SHOULDER TREATMENT    Patient Name: Ryan Franklin MRN: [id number] DOB:1950/12/20, 73 y.o., male Today's Date: 11/07/2023  END OF SESSION:  PT End of Session - 11/07/23 0939     Visit Number 5    Date for PT Re-Evaluation 12/25/23    Authorization Type [employer]    PT Start Time [phone]    PT Stop Time 1013    PT Time Calculation (min) 39 min    Activity Tolerance Patient tolerated treatment well    Behavior During Therapy [facility] for tasks assessed/performed            Past Medical History:  Diagnosis Date   Arthritis    Carotid artery occlusion    DVT (deep venous thrombosis) (HCC)    left leg   Hyperlipidemia    Hypertension    Peripheral vascular disease (HCC)    Stroke [facility])    Past Surgical History:  Procedure Laterality Date   BUBBLE STUDY  09/30/2021   Procedure: BUBBLE STUDY;  Surgeon: Mona Vinie BROCKS, MD;  Location: [facility] ENDOSCOPY;  Service: Cardiovascular;;   HERNIA REPAIR     TEE WITHOUT CARDIOVERSION N/A 09/30/2021   Procedure: TRANSESOPHAGEAL ECHOCARDIOGRAM (TEE);  Surgeon: Mona Vinie BROCKS, MD;  Location: [facility] ENDOSCOPY;  Service: Cardiovascular;  Laterality: N/A;   TRANSCAROTID ARTERY REVASCULARIZATION  Right 11/22/2021   Procedure: RIGHT TRANSCAROTID ARTERY REVASCULARIZATION;  Surgeon: Gretta Lonni PARAS, MD;  Location: [facility] OR;  Service: Vascular;  Laterality: Right;   ULTRASOUND GUIDANCE FOR VASCULAR ACCESS Left 11/22/2021   Procedure: ULTRASOUND GUIDANCE FOR VASCULAR ACCESS;  Surgeon: Gretta Lonni PARAS, MD;  Location: [facility] OR;  Service: Vascular;  Laterality: Left;   Patient Active Problem List   Diagnosis Date Noted   OSA (obstructive sleep apnea) 06/06/2022   Carotid artery stenosis 11/22/2021   Symptomatic carotid artery stenosis, right 11/16/2021   Acute right MCA stroke (HCC) 09/28/2021   CVA (cerebral vascular accident) (HCC) 09/27/2021   Hyperlipidemia    Essential hypertension 03/01/2021   Gastroesophageal reflux  disease without esophagitis 03/01/2021   Chronic headache 03/01/2021   Low back pain 03/01/2021   Partial retinal artery occlusion, right eye 03/01/2021   Pure hypercholesterolemia 03/01/2021   Retinal artery occlusion 03/01/2021   Carotid stenosis 03/01/2021    PCP: Elsie Lesches  REFERRING PROVIDER: Juliene Bailey  REFERRING DIAG:  [phone] (ICD-10-CM) - Pain in left shoulder    THERAPY DIAG:  Chronic left shoulder pain  Stiffness of left shoulder, not elsewhere classified  Impingement of left shoulder  Tendinosis of left rotator cuff  Rationale for Evaluation and Treatment: Rehabilitation  ONSET DATE: 08/21/23  SUBJECTIVE:  SUBJECTIVE STATEMENT:  Left shoulder is better but right shoulder is starting to hurt in the same spot, its not horrible but I think therapy is irritating it a bit     EVAL: I work out with a trainer and the shoulder is affecting some things. There are certain movements that hurt it. It's mainly overhead. Today the pain is not so bad.    PERTINENT HISTORY: 5/12- Additionally, he has been having for a few months some intermittent pain in the left shoulder with raising above his head. He does have some pain at night. It does come and go. It sounds more rotator cuff tendinopathic. We did provide him with home exercises. If this does not improve his symptoms he will follow-up for additional management. All questions were encouraged and answered.   6/18- In regards to the left shoulder I do believe this is more of some rotator cuff tendinosis. I will get him started in formal physical therapy. If this does not work we may consider further diagnostics or treatment. Follow-up in 4 to 6 weeks, earlier if needed. All questions were encouraged and answered.   PAIN:  Are you having  pain? Yes: NPRS scale: 1/10 Pain location: L shoulder OK, R shoulder hurts  Pain description: same sort of sharp not constant Aggravating factors: raising my arm up or out to side, free weights, lifting, direction of exercise makes a lot of difference  Relieving factors: Ibuprofen, ice   PRECAUTIONS: None  RED FLAGS: None   WEIGHT BEARING RESTRICTIONS: No  FALLS:  Has patient fallen in last 6 months? No  LIVING ENVIRONMENT: Lives with: lives alone Lives in: House/apartment  OCCUPATION: Self employed   PLOF: Independent  PATIENT GOALS: be able to work out in my full extent   NEXT MD VISIT:   OBJECTIVE:  Note: Objective measures were completed at Evaluation unless otherwise noted.  DIAGNOSTIC FINDINGS:  N/A  PATIENT SURVEYS:  QuickDash 9.1/100 = 9.1%  COGNITION: Overall cognitive status: Within functional limits for tasks assessed     SENSATION: WFL  POSTURE: Slightly rounded shoulders  UPPER EXTREMITY ROM: WFL, but pain with ABD and IR    UPPER EXTREMITY MMT: 5/5 some pain with resisted abduction   SHOULDER SPECIAL TESTS: Impingement tests: Neer impingement test: negative, Hawkins/Kennedy impingement test: positive , and Painful arc test: positive   JOINT MOBILITY TESTING:  Full PROM                                                                                                                            TREATMENT DATE:    11/07/23  UBE L3x4 minutes forward/backward for w/u    Erika 6 2# x12 each  Supine serratus punches 7# x12 L Supine serratus CW/CCW circles 7# 2x12  Supine serratus alphabet 7# x3 rounds  Body blade horizontal and vertical shakes 2x30 seconds L  Ionto patches B anterior shoulders 1.67mL 4 hour wear time     11/02/23 UBE  L 3 each way 4# seated row ,shld ext and horz abd 2 sets 10 4# standing shld flex,abd ( 2 angles ) 10 each 4# postural wall angles 5 x 4# empty can 2 sets 10 left 4# PNF 2 sets 10 Left Cable  pulley IR 15# 2 sets 10, ER 10# 2 sets 10 4# 4 pt rhy stab 10 x each FULL and = ROM. MMT WFLS with pain on Left 4# flex into abd 10 x then reverse 10 x PROM with end range stretch for pect and ER Educ in dorrway stretching Educ in avoiding chesy press,OH and push ups for now at gym Ionto patch #3 anterior L shoulder     10/31/23 UBE 3 min each way L 3 4# empty can 2 sets 10 Left 4# PNF 2 sets 10 Left 4# seated horz abd 2 sets 10 Left  Green tband IR and ER 2 sets 10 Left  Red tband 3 way stab 10 x BIL- fatigue 4# 4pt rhy stab 10 x - pain with position 3-abd 4# shld flex into abd 10 x, reverse 10 x each- pain with abd Ionto patch #2 anterior L shoulder (4 hour patch/1.83mL dex), reviewed on sx that would indicate removing patch early, wear/removal time     10/18/23  UBE L5 x3 min forward/backward  Prone blackburn 6 x12 each 0# 3 way reaches on wall red TB x10 B limited by shoulder pain with exercise  Corner stretch 3x30 seconds  Thoracic 3D excursions x10 all directions   Ionto patch #1 anterior L shoulder (4 hour patch/1.68mL dex), education on sx that would indicate removing patch early, wear/removal time     10/16/23 EVAL   PATIENT EDUCATION: Education details: POC, HEP, shoulder anatomy  Person educated: Patient Education method: Explanation and Demonstration Education comprehension: verbalized understanding and returned demonstration  HOME EXERCISE PROGRAM: Access Code: [email or web address] URL: [email or web address] Date: 10/16/2023 Prepared by: Almetta Fam  Exercises - Doorway Pec Stretch at 90 Degrees Abduction  - 1 x daily - 7 x weekly - 2 reps - 30 hold - Standing Shoulder Row with Anchored Resistance  - 1 x daily - 7 x weekly - 2 sets - 10 reps - Shoulder extension with resistance - Neutral  - 1 x daily - 7 x weekly - 2 sets - 10 reps - Standing Shoulder Horizontal Abduction with Resistance  - 1 x daily - 7 x weekly - 2 sets - 10 reps - Shoulder Flexion  Serratus Activation with Resistance  - 1 x daily - 7 x weekly - 2 sets - 10 reps - Standing Shoulder External Rotation with Resistance  - 1 x daily - 7 x weekly - 2 sets - 10 reps  ASSESSMENT:  CLINICAL IMPRESSION:    Now having some R shoulder pain in the same place- we kept working on targeted strengthening today as well as with ionto patches. Left shoulder is getting better for sure, will continue to progress as able/tolerated. Worked more on smaller, stabilizing groups in L shoulder to avoid aggravating new R shoulder pain today.      EVAL: Patient is a 73 y.o. male who was seen today for physical therapy evaluation and treatment for L shoulder pain. He has pain with flexion and abduction motions. Also has some pain with resisted abduction. He presents with positive painful arc and Vonzell Berber tests, indicating possible impingement syndrome. His doctor thinks he may have some rotator cuff tendinosis. Patient works out with a Warehouse manager  a week and reports she is very good about modifying exercises and stopping if he has pain. He will benefit from skilled PT to address his shoulder pain with modalities, postural strengthening, and stretching to allow him to continue with his training without pain.   OBJECTIVE IMPAIRMENTS: impaired UE functional use, improper body mechanics, postural dysfunction, and pain.   ACTIVITY LIMITATIONS: carrying, lifting, and reach over head  PARTICIPATION LIMITATIONS: cleaning, yard work, and gym  REHAB POTENTIAL: Good  CLINICAL DECISION MAKING: Stable/uncomplicated  EVALUATION COMPLEXITY: Low  GOALS: Goals reviewed with patient? Yes  SHORT TERM GOALS: Target date: 11/20/23  Patient will be independent with initial HEP.  Baseline:  Goal status: MET 10/31/23   LONG TERM GOALS: Target date: 12/25/23  Patient will be independent with advanced/ongoing HEP to improve outcomes and carryover.  Baseline:  Goal status: INITIAL  2.  Patient will report  75% improvement in L shoulder pain to improve QOL.  Baseline: 7/10 Goal status: progressing 10/31/23  3.  Patient will be able to work out at the gym without pain in shoulder  Baseline:  Goal status: progressing 10/31/23  4.  Patient will demonstrate negative shoulder impingement testing Baseline: + painful arc and Hawkins Goal status: progressing 10/31/23   PLAN:  PT FREQUENCY: 1-2x/week  PT DURATION: 10 weeks  PLANNED INTERVENTIONS: 97110-Therapeutic exercises, 97530- Therapeutic activity, 97112- Neuromuscular re-education, 97535- Self Care, 02859- Manual therapy, 97016- Vasopneumatic device, [id number]- Ultrasound, [id number]- Ionotophoresis 4mg /ml Dexamethasone , 79439 (1-2 muscles), 20561 (3+ muscles)- Dry Needling, Patient/Family education, Taping, Joint mobilization, Joint manipulation, Spinal manipulation, Spinal mobilization, Cryotherapy, and Moist heat  PLAN FOR NEXT SESSION: progressive shoulder/postural strengthening, continue ionto- how is R shoulder feeling since last time?   Josette Rough, PT, DPT 11/07/23 10:21 AM

## 2023-11-09 ENCOUNTER — Ambulatory Visit: Admitting: Physical Therapy

## 2023-11-09 DIAGNOSIS — M25812 Other specified joint disorders, left shoulder: Secondary | ICD-10-CM | POA: Diagnosis not present

## 2023-11-09 DIAGNOSIS — M25612 Stiffness of left shoulder, not elsewhere classified: Secondary | ICD-10-CM | POA: Diagnosis not present

## 2023-11-09 DIAGNOSIS — M67814 Other specified disorders of tendon, left shoulder: Secondary | ICD-10-CM

## 2023-11-09 DIAGNOSIS — G8929 Other chronic pain: Secondary | ICD-10-CM

## 2023-11-09 DIAGNOSIS — M25512 Pain in left shoulder: Secondary | ICD-10-CM | POA: Diagnosis not present

## 2023-11-09 NOTE — Therapy (Signed)
 OUTPATIENT PHYSICAL THERAPY SHOULDER TREATMENT    Patient Name: EBERT FORRESTER MRN: [id number] DOB:[birth year], 73 y.o., male Today's Date: 11/09/2023  END OF SESSION:  PT End of Session - 11/09/23 1010     Visit Number 6    Date for PT Re-Evaluation 12/25/23    Authorization Type [employer]    PT Start Time 1015    PT Stop Time 1100    PT Time Calculation (min) 45 min            Past Medical History:  Diagnosis Date   Arthritis    Carotid artery occlusion    DVT (deep venous thrombosis) (HCC)    left leg   Hyperlipidemia    Hypertension    Peripheral vascular disease (HCC)    Stroke [facility])    Past Surgical History:  Procedure Laterality Date   BUBBLE STUDY  09/30/2021   Procedure: BUBBLE STUDY;  Surgeon: Mona Vinie BROCKS, MD;  Location: [facility] ENDOSCOPY;  Service: Cardiovascular;;   HERNIA REPAIR     TEE WITHOUT CARDIOVERSION N/A 09/30/2021   Procedure: TRANSESOPHAGEAL ECHOCARDIOGRAM (TEE);  Surgeon: Mona Vinie BROCKS, MD;  Location: [facility] ENDOSCOPY;  Service: Cardiovascular;  Laterality: N/A;   TRANSCAROTID ARTERY REVASCULARIZATION  Right 11/22/2021   Procedure: RIGHT TRANSCAROTID ARTERY REVASCULARIZATION;  Surgeon: Gretta Lonni PARAS, MD;  Location: [facility] OR;  Service: Vascular;  Laterality: Right;   ULTRASOUND GUIDANCE FOR VASCULAR ACCESS Left 11/22/2021   Procedure: ULTRASOUND GUIDANCE FOR VASCULAR ACCESS;  Surgeon: Gretta Lonni PARAS, MD;  Location: [facility] OR;  Service: Vascular;  Laterality: Left;   Patient Active Problem List   Diagnosis Date Noted   OSA (obstructive sleep apnea) 06/06/2022   Carotid artery stenosis 11/22/2021   Symptomatic carotid artery stenosis, right 11/16/2021   Acute right MCA stroke (HCC) 09/28/2021   CVA (cerebral vascular accident) (HCC) 09/27/2021   Hyperlipidemia    Essential hypertension 03/01/2021   Gastroesophageal reflux disease without esophagitis 03/01/2021   Chronic headache 03/01/2021   Low back pain 03/01/2021   Partial retinal  artery occlusion, right eye 03/01/2021   Pure hypercholesterolemia 03/01/2021   Retinal artery occlusion 03/01/2021   Carotid stenosis 03/01/2021    PCP: Elsie Lesches  REFERRING PROVIDER: Juliene Bailey  REFERRING DIAG:  [phone] (ICD-10-CM) - Pain in left shoulder    THERAPY DIAG:  Chronic left shoulder pain  Stiffness of left shoulder, not elsewhere classified  Impingement of left shoulder  Tendinosis of left rotator cuff  Rationale for Evaluation and Treatment: Rehabilitation  ONSET DATE: 08/21/23  SUBJECTIVE:  SUBJECTIVE STATEMENT: Overall 60% better    PERTINENT HISTORY: 5/12- Additionally, he has been having for a few months some intermittent pain in the left shoulder with raising above his head. He does have some pain at night. It does come and go. It sounds more rotator cuff tendinopathic. We did provide him with home exercises. If this does not improve his symptoms he will follow-up for additional management. All questions were encouraged and answered.   6/18- In regards to the left shoulder I do believe this is more of some rotator cuff tendinosis. I will get him started in formal physical therapy. If this does not work we may consider further diagnostics or treatment. Follow-up in 4 to 6 weeks, earlier if needed. All questions were encouraged and answered.   PAIN:  Are you having pain? Yes: NPRS scale: 1/10 Pain location: L shoulder OK, R shoulder hurts  Pain description: same sort of sharp not constant Aggravating factors: raising my arm up or out to side, free weights, lifting, direction of exercise makes a lot of difference  Relieving factors: Ibuprofen, ice   PRECAUTIONS: None  RED FLAGS: None   WEIGHT BEARING RESTRICTIONS: No  FALLS:  Has patient fallen in last 6 months?  No  LIVING ENVIRONMENT: Lives with: lives alone Lives in: House/apartment  OCCUPATION: Self employed   PLOF: Independent  PATIENT GOALS: be able to work out in my full extent   NEXT MD VISIT:   OBJECTIVE:  Note: Objective measures were completed at Evaluation unless otherwise noted.  DIAGNOSTIC FINDINGS:  N/A  PATIENT SURVEYS:  QuickDash 9.1/100 = 9.1%  COGNITION: Overall cognitive status: Within functional limits for tasks assessed     SENSATION: WFL  POSTURE: Slightly rounded shoulders  UPPER EXTREMITY ROM: WFL, but pain with ABD and IR    UPPER EXTREMITY MMT: 5/5 some pain with resisted abduction   SHOULDER SPECIAL TESTS: Impingement tests: Neer impingement test: negative, Hawkins/Kennedy impingement test: positive , and Painful arc test: positive   JOINT MOBILITY TESTING:  Full PROM                                                                                                                            TREATMENT DATE:   11/09/23 Assessed and documented goals FULL ,PAINFREE ROM. MMT ABD 5/5 without pain Body Blade- various directions Rhy stab with wt ball 15 x flex abd abd Yellow ball rolls on wall CW and CCW 15 x Serratus punch 25# 2 sets 15 Single arm chest press 10 x 2 sets 20# Nustep standing push/pull 2 min the horz abd 2 min L 5 Seated BIL row, horz abd and shld ext 6# 2 sets 10 Empty can 6# 2 sets 10 PNF 6# 2 sets 10 Tricep ext 75# 2 sets 15 Bicep Curl 35# 2 sets 15 Upright Row 35# 2 sets 15  Ionto patch #5 anterior L shoulder      11/07/23  UBE L3x4 minutes forward/backward for  w/u    Erika 6 2# x12 each  Supine serratus punches 7# x12 L Supine serratus CW/CCW circles 7# 2x12  Supine serratus alphabet 7# x3 rounds  Body blade horizontal and vertical shakes 2x30 seconds L  Ionto patches B anterior shoulders 1.54mL 4 hour wear time     11/02/23 UBE L 3 each way 4# seated row ,shld ext and horz abd 2 sets 10 4#  standing shld flex,abd ( 2 angles ) 10 each 4# postural wall angles 5 x 4# empty can 2 sets 10 left 4# PNF 2 sets 10 Left Cable pulley IR 15# 2 sets 10, ER 10# 2 sets 10 4# 4 pt rhy stab 10 x each FULL and = ROM. MMT WFLS with pain on Left 4# flex into abd 10 x then reverse 10 x PROM with end range stretch for pect and ER Educ in dorrway stretching Educ in avoiding chesy press,OH and push ups for now at gym Ionto patch #3 anterior L shoulder     10/31/23 UBE 3 min each way L 3 4# empty can 2 sets 10 Left 4# PNF 2 sets 10 Left 4# seated horz abd 2 sets 10 Left  Green tband IR and ER 2 sets 10 Left  Red tband 3 way stab 10 x BIL- fatigue 4# 4pt rhy stab 10 x - pain with position 3-abd 4# shld flex into abd 10 x, reverse 10 x each- pain with abd Ionto patch #2 anterior L shoulder (4 hour patch/1.47mL dex), reviewed on sx that would indicate removing patch early, wear/removal time     10/18/23  UBE L5 x3 min forward/backward  Prone blackburn 6 x12 each 0# 3 way reaches on wall red TB x10 B limited by shoulder pain with exercise  Corner stretch 3x30 seconds  Thoracic 3D excursions x10 all directions   Ionto patch #1 anterior L shoulder (4 hour patch/1.41mL dex), education on sx that would indicate removing patch early, wear/removal time     10/16/23 EVAL   PATIENT EDUCATION: Education details: POC, HEP, shoulder anatomy  Person educated: Patient Education method: Explanation and Demonstration Education comprehension: verbalized understanding and returned demonstration  HOME EXERCISE PROGRAM: Access Code: [email or web address] URL: [email or web address] Date: 10/16/2023 Prepared by: Almetta Fam  Exercises - Doorway Pec Stretch at 90 Degrees Abduction  - 1 x daily - 7 x weekly - 2 reps - 30 hold - Standing Shoulder Row with Anchored Resistance  - 1 x daily - 7 x weekly - 2 sets - 10 reps - Shoulder extension with resistance - Neutral  - 1 x daily - 7 x weekly - 2 sets -  10 reps - Standing Shoulder Horizontal Abduction with Resistance  - 1 x daily - 7 x weekly - 2 sets - 10 reps - Shoulder Flexion Serratus Activation with Resistance  - 1 x daily - 7 x weekly - 2 sets - 10 reps - Standing Shoulder External Rotation with Resistance  - 1 x daily - 7 x weekly - 2 sets - 10 reps  ASSESSMENT:  CLINICAL IMPRESSION:  Full ROM painfree. MMT shld abd 5/5 without pain. Goals assessed and documented, progressed ex and discussed gym safety    EVAL: Patient is a 73 y.o. male who was seen today for physical therapy evaluation and treatment for L shoulder pain. He has pain with flexion and abduction motions. Also has some pain with resisted abduction. He presents with positive painful arc and Vonzell Berber tests, indicating possible impingement  syndrome. His doctor thinks he may have some rotator cuff tendinosis. Patient works out with a Warehouse manager a week and reports she is very good about modifying exercises and stopping if he has pain. He will benefit from skilled PT to address his shoulder pain with modalities, postural strengthening, and stretching to allow him to continue with his training without pain.   OBJECTIVE IMPAIRMENTS: impaired UE functional use, improper body mechanics, postural dysfunction, and pain.   ACTIVITY LIMITATIONS: carrying, lifting, and reach over head  PARTICIPATION LIMITATIONS: cleaning, yard work, and gym  REHAB POTENTIAL: Good  CLINICAL DECISION MAKING: Stable/uncomplicated  EVALUATION COMPLEXITY: Low  GOALS: Goals reviewed with patient? Yes  SHORT TERM GOALS: Target date: 11/20/23  Patient will be independent with initial HEP.  Baseline:  Goal status: MET 10/31/23   LONG TERM GOALS: Target date: 12/25/23  Patient will be independent with advanced/ongoing HEP to improve outcomes and carryover.  Baseline:  Goal status: INITIAL  2.  Patient will report 75% improvement in L shoulder pain to improve QOL.  Baseline: 7/10 Goal  status: progressing 10/31/23  60% 11/09/23 progressing  3.  Patient will be able to work out at the gym without pain in shoulder  Baseline:  Goal status: progressing 10/31/23 and 11/09/23  4.  Patient will demonstrate negative shoulder impingement testing Baseline: + painful arc and Hawkins Goal status: progressing 10/31/23 and 11/09/23   PLAN:  PT FREQUENCY: 1-2x/week  PT DURATION: 10 weeks  PLANNED INTERVENTIONS: 97110-Therapeutic exercises, 97530- Therapeutic activity, 97112- Neuromuscular re-education, 97535- Self Care, 02859- Manual therapy, 97016- Vasopneumatic device, [id number]- Ultrasound, [id number]- Ionotophoresis 4mg /ml Dexamethasone , 79439 (1-2 muscles), 20561 (3+ muscles)- Dry Needling, Patient/Family education, Taping, Joint mobilization, Joint manipulation, Spinal manipulation, Spinal mobilization, Cryotherapy, and Moist heat  PLAN FOR NEXT SESSION: progressive shoulder/postural strengthening, continue ionto- how is R shoulder feeling since last time?   Jon [name] PTA 11/09/23 10:11 AM

## 2023-11-15 ENCOUNTER — Other Ambulatory Visit: Payer: Self-pay

## 2023-11-15 ENCOUNTER — Ambulatory Visit: Attending: Sports Medicine

## 2023-11-15 DIAGNOSIS — M67814 Other specified disorders of tendon, left shoulder: Secondary | ICD-10-CM | POA: Diagnosis not present

## 2023-11-15 DIAGNOSIS — G8929 Other chronic pain: Secondary | ICD-10-CM | POA: Insufficient documentation

## 2023-11-15 DIAGNOSIS — M25512 Pain in left shoulder: Secondary | ICD-10-CM | POA: Diagnosis not present

## 2023-11-15 DIAGNOSIS — M25812 Other specified joint disorders, left shoulder: Secondary | ICD-10-CM | POA: Diagnosis not present

## 2023-11-15 DIAGNOSIS — M25612 Stiffness of left shoulder, not elsewhere classified: Secondary | ICD-10-CM | POA: Diagnosis not present

## 2023-11-15 NOTE — Therapy (Signed)
 OUTPATIENT PHYSICAL THERAPY SHOULDER TREATMENT    Patient Name: Ryan Franklin MRN: [id number] DOB:Apr 05, 1951, 73 y.o., male Today's Date: 11/15/2023  END OF SESSION:  PT End of Session - 11/15/23 0806     Visit Number 7    Date for PT Re-Evaluation 12/25/23    Authorization Type [employer]    PT Start Time 0806    PT Stop Time 0845    PT Time Calculation (min) 39 min             Past Medical History:  Diagnosis Date   Arthritis    Carotid artery occlusion    DVT (deep venous thrombosis) (HCC)    left leg   Hyperlipidemia    Hypertension    Peripheral vascular disease (HCC)    Stroke [facility])    Past Surgical History:  Procedure Laterality Date   BUBBLE STUDY  09/30/2021   Procedure: BUBBLE STUDY;  Surgeon: Mona Vinie BROCKS, MD;  Location: [facility] ENDOSCOPY;  Service: Cardiovascular;;   HERNIA REPAIR     TEE WITHOUT CARDIOVERSION N/A 09/30/2021   Procedure: TRANSESOPHAGEAL ECHOCARDIOGRAM (TEE);  Surgeon: Mona Vinie BROCKS, MD;  Location: [facility] ENDOSCOPY;  Service: Cardiovascular;  Laterality: N/A;   TRANSCAROTID ARTERY REVASCULARIZATION  Right 11/22/2021   Procedure: RIGHT TRANSCAROTID ARTERY REVASCULARIZATION;  Surgeon: Gretta Lonni PARAS, MD;  Location: [facility] OR;  Service: Vascular;  Laterality: Right;   ULTRASOUND GUIDANCE FOR VASCULAR ACCESS Left 11/22/2021   Procedure: ULTRASOUND GUIDANCE FOR VASCULAR ACCESS;  Surgeon: Gretta Lonni PARAS, MD;  Location: [facility] OR;  Service: Vascular;  Laterality: Left;   Patient Active Problem List   Diagnosis Date Noted   OSA (obstructive sleep apnea) 06/06/2022   Carotid artery stenosis 11/22/2021   Symptomatic carotid artery stenosis, right 11/16/2021   Acute right MCA stroke (HCC) 09/28/2021   CVA (cerebral vascular accident) (HCC) 09/27/2021   Hyperlipidemia    Essential hypertension 03/01/2021   Gastroesophageal reflux disease without esophagitis 03/01/2021   Chronic headache 03/01/2021   Low back pain 03/01/2021   Partial retinal  artery occlusion, right eye 03/01/2021   Pure hypercholesterolemia 03/01/2021   Retinal artery occlusion 03/01/2021   Carotid stenosis 03/01/2021    PCP: Elsie Lesches  REFERRING PROVIDER: Juliene Bailey  REFERRING DIAG:  [phone] (ICD-10-CM) - Pain in left shoulder    THERAPY DIAG:  Chronic left shoulder pain  Stiffness of left shoulder, not elsewhere classified  Impingement of left shoulder  Tendinosis of left rotator cuff  Rationale for Evaluation and Treatment: Rehabilitation  ONSET DATE: 08/21/23  SUBJECTIVE:  SUBJECTIVE STATEMENT: Better, working out with trainer 4 x week, adapting ex with the trainer. Dont need iontophoresis today.  Still afraid to sleep on L side due to pain.   PERTINENT HISTORY: 5/12- Additionally, he has been having for a few months some intermittent pain in the left shoulder with raising above his head. He does have some pain at night. It does come and go. It sounds more rotator cuff tendinopathic. We did provide him with home exercises. If this does not improve his symptoms he will follow-up for additional management. All questions were encouraged and answered.   6/18- In regards to the left shoulder I do believe this is more of some rotator cuff tendinosis. I will get him started in formal physical therapy. If this does not work we may consider further diagnostics or treatment. Follow-up in 4 to 6 weeks, earlier if needed. All questions were encouraged and answered.   PAIN:  Are you having pain? Yes: NPRS scale: 1/10 Pain location: L shoulder OK, R shoulder hurts  Pain description: same sort of sharp not constant Aggravating factors: raising my arm up or out to side, free weights, lifting, direction of exercise makes a lot of difference  Relieving factors: Ibuprofen, ice    PRECAUTIONS: None  RED FLAGS: None   WEIGHT BEARING RESTRICTIONS: No  FALLS:  Has patient fallen in last 6 months? No  LIVING ENVIRONMENT: Lives with: lives alone Lives in: House/apartment  OCCUPATION: Self employed   PLOF: Independent  PATIENT GOALS: be able to work out in my full extent   NEXT MD VISIT:   OBJECTIVE:  Note: Objective measures were completed at Evaluation unless otherwise noted.  DIAGNOSTIC FINDINGS:  N/A  PATIENT SURVEYS:  QuickDash 9.1/100 = 9.1%  COGNITION: Overall cognitive status: Within functional limits for tasks assessed     SENSATION: WFL  POSTURE: Slightly rounded shoulders  UPPER EXTREMITY ROM: WFL, but pain with ABD and IR    UPPER EXTREMITY MMT: 5/5 some pain with resisted abduction   SHOULDER SPECIAL TESTS: Impingement tests: Neer impingement test: negative, Hawkins/Kennedy impingement test: positive , and Painful arc test: positive   JOINT MOBILITY TESTING:  Full PROM                                                                                                                            TREATMENT DATE:  11/15/23: Reassessed ER lag sign B, pt with mild pain L shoulder, also tender to palpate over long head L biceps Manual: supine for inferior glides L humeral head, also  Lat glides humeral head while stretching L post shoulder capsule into L horizontal adduction, utilizing belt for better isolation  Inst in self post L shoulder capsule stretch with thread the needle, also with supine horizontal L shoulder adduction , combined with L LTR  Rhythmic stabilization: Body blade L hand various positions,  Added shoulder ext with body blade B Adapted B shoulder ER with theraband to incorporate fast  movements short amplitude for better control, rhythmic stab Serratus punches with 7# B mass practice Education to stretch post L shoulder capsule before strengthening UE with trainer  11/09/23 Assessed and documented goals FULL  ,PAINFREE ROM. MMT ABD 5/5 without pain Body Blade- various directions Rhy stab with wt ball 15 x flex abd abd Yellow ball rolls on wall CW and CCW 15 x Serratus punch 25# 2 sets 15 Single arm chest press 10 x 2 sets 20# Nustep standing push/pull 2 min the horz abd 2 min L 5 Seated BIL row, horz abd and shld ext 6# 2 sets 10 Empty can 6# 2 sets 10 PNF 6# 2 sets 10 Tricep ext 75# 2 sets 15 Bicep Curl 35# 2 sets 15 Upright Row 35# 2 sets 15  Ionto patch #5 anterior L shoulder      11/07/23  UBE L3x4 minutes forward/backward for w/u    Erika 6 2# x12 each  Supine serratus punches 7# x12 L Supine serratus CW/CCW circles 7# 2x12  Supine serratus alphabet 7# x3 rounds  Body blade horizontal and vertical shakes 2x30 seconds L  Ionto patches B anterior shoulders 1.38mL 4 hour wear time     11/02/23 UBE L 3 each way 4# seated row ,shld ext and horz abd 2 sets 10 4# standing shld flex,abd ( 2 angles ) 10 each 4# postural wall angles 5 x 4# empty can 2 sets 10 left 4# PNF 2 sets 10 Left Cable pulley IR 15# 2 sets 10, ER 10# 2 sets 10 4# 4 pt rhy stab 10 x each FULL and = ROM. MMT WFLS with pain on Left 4# flex into abd 10 x then reverse 10 x PROM with end range stretch for pect and ER Educ in dorrway stretching Educ in avoiding chesy press,OH and push ups for now at gym Ionto patch #3 anterior L shoulder     10/31/23 UBE 3 min each way L 3 4# empty can 2 sets 10 Left 4# PNF 2 sets 10 Left 4# seated horz abd 2 sets 10 Left  Green tband IR and ER 2 sets 10 Left  Red tband 3 way stab 10 x BIL- fatigue 4# 4pt rhy stab 10 x - pain with position 3-abd 4# shld flex into abd 10 x, reverse 10 x each- pain with abd Ionto patch #2 anterior L shoulder (4 hour patch/1.86mL dex), reviewed on sx that would indicate removing patch early, wear/removal time     10/18/23  UBE L5 x3 min forward/backward  Prone blackburn 6 x12 each 0# 3 way reaches on wall red TB x10 B  limited by shoulder pain with exercise  Corner stretch 3x30 seconds  Thoracic 3D excursions x10 all directions   Ionto patch #1 anterior L shoulder (4 hour patch/1.2mL dex), education on sx that would indicate removing patch early, wear/removal time     10/16/23 EVAL   PATIENT EDUCATION: Education details: POC, HEP, shoulder anatomy  Person educated: Patient Education method: Explanation and Demonstration Education comprehension: verbalized understanding and returned demonstration  HOME EXERCISE PROGRAM: Access Code: [email or web address] URL: [email or web address] Date: 10/16/2023 Prepared by: Almetta Fam  Exercises - Doorway Pec Stretch at 90 Degrees Abduction  - 1 x daily - 7 x weekly - 2 reps - 30 hold - Standing Shoulder Row with Anchored Resistance  - 1 x daily - 7 x weekly - 2 sets - 10 reps - Shoulder extension with resistance - Neutral  - 1 x daily -  7 x weekly - 2 sets - 10 reps - Standing Shoulder Horizontal Abduction with Resistance  - 1 x daily - 7 x weekly - 2 sets - 10 reps - Shoulder Flexion Serratus Activation with Resistance  - 1 x daily - 7 x weekly - 2 sets - 10 reps - Standing Shoulder External Rotation with Resistance  - 1 x daily - 7 x weekly - 2 sets - 10 reps  ASSESSMENT:  CLINICAL IMPRESSION:  7th PT visit to address rotator cuff tendinopathy B, more on L.  Focused more on refining his current routine to address endurance , centralization of humeral head with post L shoulder capsule stretch, and scapular muscle stability.  He is recovering very well and may be near completion of his PT . Will assess again next visit.   EVAL: Patient is a 73 y.o. male who was seen today for physical therapy evaluation and treatment for L shoulder pain. He has pain with flexion and abduction motions. Also has some pain with resisted abduction. He presents with positive painful arc and Vonzell Berber tests, indicating possible impingement syndrome. His doctor thinks he may  have some rotator cuff tendinosis. Patient works out with a Warehouse manager a week and reports she is very good about modifying exercises and stopping if he has pain. He will benefit from skilled PT to address his shoulder pain with modalities, postural strengthening, and stretching to allow him to continue with his training without pain.   OBJECTIVE IMPAIRMENTS: impaired UE functional use, improper body mechanics, postural dysfunction, and pain.   ACTIVITY LIMITATIONS: carrying, lifting, and reach over head  PARTICIPATION LIMITATIONS: cleaning, yard work, and gym  REHAB POTENTIAL: Good  CLINICAL DECISION MAKING: Stable/uncomplicated  EVALUATION COMPLEXITY: Low  GOALS: Goals reviewed with patient? Yes  SHORT TERM GOALS: Target date: 11/20/23  Patient will be independent with initial HEP.  Baseline:  Goal status: MET 10/31/23   LONG TERM GOALS: Target date: 12/25/23  Patient will be independent with advanced/ongoing HEP to improve outcomes and carryover.  Baseline:  Goal status: INITIAL  2.  Patient will report 75% improvement in L shoulder pain to improve QOL.  Baseline: 7/10 Goal status: progressing 10/31/23  60% 11/09/23 progressing  3.  Patient will be able to work out at the gym without pain in shoulder  Baseline:  Goal status: progressing 10/31/23 and 11/09/23  4.  Patient will demonstrate negative shoulder impingement testing Baseline: + painful arc and Hawkins Goal status: progressing 10/31/23 and 11/09/23   PLAN:  PT FREQUENCY: 1-2x/week  PT DURATION: 10 weeks  PLANNED INTERVENTIONS: 97110-Therapeutic exercises, 97530- Therapeutic activity, 97112- Neuromuscular re-education, 97535- Self Care, 02859- Manual therapy, 97016- Vasopneumatic device, [id number]- Ultrasound, [id number]- Ionotophoresis 4mg /ml Dexamethasone , 79439 (1-2 muscles), 20561 (3+ muscles)- Dry Needling, Patient/Family education, Taping, Joint mobilization, Joint manipulation, Spinal manipulation, Spinal mobilization,  Cryotherapy, and Moist heat  PLAN FOR NEXT SESSION: progressive shoulder/postural strengthening, continue ionto- how is R shoulder feeling since last time?   Greig Credit, PT, DPT, OCS 11/15/23 5:21 PM

## 2023-11-21 ENCOUNTER — Ambulatory Visit: Admitting: Physical Therapy

## 2023-11-21 DIAGNOSIS — M25512 Pain in left shoulder: Secondary | ICD-10-CM | POA: Diagnosis not present

## 2023-11-21 DIAGNOSIS — M67814 Other specified disorders of tendon, left shoulder: Secondary | ICD-10-CM | POA: Diagnosis not present

## 2023-11-21 DIAGNOSIS — M25812 Other specified joint disorders, left shoulder: Secondary | ICD-10-CM | POA: Diagnosis not present

## 2023-11-21 DIAGNOSIS — G8929 Other chronic pain: Secondary | ICD-10-CM

## 2023-11-21 DIAGNOSIS — M25612 Stiffness of left shoulder, not elsewhere classified: Secondary | ICD-10-CM | POA: Diagnosis not present

## 2023-11-21 NOTE — Therapy (Signed)
 OUTPATIENT PHYSICAL THERAPY SHOULDER TREATMENT    Patient Name: ELDEN BRUCATO MRN: [id number] DOB:1951-01-08, 73 y.o., male Today's Date: 11/21/2023  END OF SESSION:  PT End of Session - 11/21/23 0753     Visit Number 8    Date for PT Re-Evaluation 12/25/23    Authorization Type [employer]    PT Start Time 0753    PT Stop Time 0835    PT Time Calculation (min) 42 min             Past Medical History:  Diagnosis Date   Arthritis    Carotid artery occlusion    DVT (deep venous thrombosis) (HCC)    left leg   Hyperlipidemia    Hypertension    Peripheral vascular disease (HCC)    Stroke [facility])    Past Surgical History:  Procedure Laterality Date   BUBBLE STUDY  09/30/2021   Procedure: BUBBLE STUDY;  Surgeon: Mona Vinie BROCKS, MD;  Location: [facility] ENDOSCOPY;  Service: Cardiovascular;;   HERNIA REPAIR     TEE WITHOUT CARDIOVERSION N/A 09/30/2021   Procedure: TRANSESOPHAGEAL ECHOCARDIOGRAM (TEE);  Surgeon: Mona Vinie BROCKS, MD;  Location: [facility] ENDOSCOPY;  Service: Cardiovascular;  Laterality: N/A;   TRANSCAROTID ARTERY REVASCULARIZATION  Right 11/22/2021   Procedure: RIGHT TRANSCAROTID ARTERY REVASCULARIZATION;  Surgeon: Gretta Lonni PARAS, MD;  Location: [facility] OR;  Service: Vascular;  Laterality: Right;   ULTRASOUND GUIDANCE FOR VASCULAR ACCESS Left 11/22/2021   Procedure: ULTRASOUND GUIDANCE FOR VASCULAR ACCESS;  Surgeon: Gretta Lonni PARAS, MD;  Location: [facility] OR;  Service: Vascular;  Laterality: Left;   Patient Active Problem List   Diagnosis Date Noted   OSA (obstructive sleep apnea) 06/06/2022   Carotid artery stenosis 11/22/2021   Symptomatic carotid artery stenosis, right 11/16/2021   Acute right MCA stroke (HCC) 09/28/2021   CVA (cerebral vascular accident) (HCC) 09/27/2021   Hyperlipidemia    Essential hypertension 03/01/2021   Gastroesophageal reflux disease without esophagitis 03/01/2021   Chronic headache 03/01/2021   Low back pain 03/01/2021   Partial retinal  artery occlusion, right eye 03/01/2021   Pure hypercholesterolemia 03/01/2021   Retinal artery occlusion 03/01/2021   Carotid stenosis 03/01/2021    PCP: Elsie Lesches  REFERRING PROVIDER: Juliene Bailey  REFERRING DIAG:  [id number] (ICD-10-CM) - Pain in left shoulder    THERAPY DIAG:  Chronic left shoulder pain  Stiffness of left shoulder, not elsewhere classified  Impingement of left shoulder  Tendinosis of left rotator cuff  Rationale for Evaluation and Treatment: Rehabilitation  ONSET DATE: 08/21/23  SUBJECTIVE:         overall doing better. Maybe ready for 1 x a week and do more at gym  PERTINENT HISTORY: 5/12- Additionally, he has been having for a few months some intermittent pain in the left shoulder with raising above his head. He does have some pain at night. It does come and go. It sounds more rotator cuff tendinopathic. We did provide him with home exercises. If this does not improve his symptoms he will follow-up for additional management. All questions were encouraged and answered.   6/18- In regards to the left shoulder I do believe this is more of some rotator cuff tendinosis. I will get him started in formal physical therapy. If this does not work we may consider further diagnostics or treatment. Follow-up in 4 to 6 weeks, earlier if needed. All questions were encouraged and answered.   PAIN:  Are you having pain? Yes: NPRS scale: 1/10 Pain location: L shoulder OK, R shoulder hurts  Pain description: same sort of sharp not constant Aggravating factors: raising my arm up or out to side, free weights, lifting, direction of exercise makes a lot of difference  Relieving factors: Ibuprofen, ice   PRECAUTIONS: None  RED FLAGS: None   WEIGHT BEARING RESTRICTIONS: No  FALLS:  Has patient fallen  in last 6 months? No  LIVING ENVIRONMENT: Lives with: lives alone Lives in: House/apartment  OCCUPATION: Self employed   PLOF: Independent  PATIENT GOALS: be able to work out in my full extent   NEXT MD VISIT:   OBJECTIVE:  Note: Objective measures were completed at Evaluation unless otherwise noted.  DIAGNOSTIC FINDINGS:  N/A  PATIENT SURVEYS:  QuickDash 9.1/100 = 9.1%  COGNITION: Overall cognitive status: Within functional limits for tasks assessed     SENSATION: WFL  POSTURE: Slightly rounded shoulders  UPPER EXTREMITY ROM: WFL, but pain with ABD and IR    UPPER EXTREMITY MMT: 5/5 some pain with resisted abduction   SHOULDER SPECIAL TESTS: Impingement tests: Neer impingement test: negative, Hawkins/Kennedy impingement test: positive , and Painful arc test: positive   JOINT MOBILITY TESTING:  Full PROM                                                                                                                            TREATMENT DATE:   11/21/23 Walking planks 10 x led RT . 10 x led left Push ups 8 x Plank BOSU -various ex 10# OH press 2 sets 10 Cable pulley PNF 45# 6# 3 pt rhy stab 12 x at wall- pain at W I,T ,Y 6# left UE Joint capsul stretching     11/15/23: Reassessed ER lag sign B, pt with mild pain L shoulder, also tender to palpate over long head L biceps Manual: supine for inferior glides L humeral head, also  Lat glides humeral head while stretching L post shoulder capsule into L horizontal adduction, utilizing belt for better isolation  Inst in self post L shoulder capsule stretch with thread the needle, also with supine horizontal L shoulder adduction , combined with L LTR  Rhythmic stabilization:  Body blade L hand various positions,  Added shoulder ext with body blade B Adapted B shoulder ER with theraband to incorporate fast movements short amplitude for better control, rhythmic stab Serratus punches with 7# B mass  practice Education to stretch post L shoulder capsule before strengthening UE with trainer  11/09/23 Assessed and documented goals FULL ,PAINFREE ROM. MMT ABD 5/5 without pain Body Blade- various directions Rhy stab with wt ball 15 x flex abd abd Yellow ball rolls on wall CW and CCW 15 x Serratus punch 25# 2 sets 15 Single arm chest press 10 x 2 sets 20# Nustep standing push/pull 2 min the horz abd 2 min L 5 Seated BIL row, horz abd and shld ext 6# 2 sets 10 Empty can 6# 2 sets 10 PNF 6# 2 sets 10 Tricep ext 75# 2 sets 15 Bicep Curl 35# 2 sets 15 Upright Row 35# 2 sets 15  Ionto patch #5 anterior L shoulder      11/07/23  UBE L3x4 minutes forward/backward for w/u    Erika 6 2# x12 each  Supine serratus punches 7# x12 L Supine serratus CW/CCW circles 7# 2x12  Supine serratus alphabet 7# x3 rounds  Body blade horizontal and vertical shakes 2x30 seconds L  Ionto patches B anterior shoulders 1.22mL 4 hour wear time     11/02/23 UBE L 3 each way 4# seated row ,shld ext and horz abd 2 sets 10 4# standing shld flex,abd ( 2 angles ) 10 each 4# postural wall angles 5 x 4# empty can 2 sets 10 left 4# PNF 2 sets 10 Left Cable pulley IR 15# 2 sets 10, ER 10# 2 sets 10 4# 4 pt rhy stab 10 x each FULL and = ROM. MMT WFLS with pain on Left 4# flex into abd 10 x then reverse 10 x PROM with end range stretch for pect and ER Educ in dorrway stretching Educ in avoiding chesy press,OH and push ups for now at gym Ionto patch #3 anterior L shoulder     10/31/23 UBE 3 min each way L 3 4# empty can 2 sets 10 Left 4# PNF 2 sets 10 Left 4# seated horz abd 2 sets 10 Left  Green tband IR and ER 2 sets 10 Left  Red tband 3 way stab 10 x BIL- fatigue 4# 4pt rhy stab 10 x - pain with position 3-abd 4# shld flex into abd 10 x, reverse 10 x each- pain with abd Ionto patch #2 anterior L shoulder (4 hour patch/1.41mL dex), reviewed on sx that would indicate removing patch early,  wear/removal time     10/18/23  UBE L5 x3 min forward/backward  Prone blackburn 6 x12 each 0# 3 way reaches on wall red TB x10 B limited by shoulder pain with exercise  Corner stretch 3x30 seconds  Thoracic 3D excursions x10 all directions   Ionto patch #1 anterior L shoulder (4 hour patch/1.67mL dex), education on sx that would indicate removing patch early, wear/removal time     10/16/23 EVAL   PATIENT EDUCATION: Education details: POC, HEP, shoulder anatomy  Person educated: Patient Education method: Explanation and Demonstration Education comprehension: verbalized understanding and returned demonstration  HOME EXERCISE PROGRAM: Access Code: [email or web address] URL: [email or web address] Date: 10/16/2023 Prepared by: Almetta Fam  Exercises - Doorway Pec Stretch at 90 Degrees Abduction  - 1 x daily - 7 x weekly - 2 reps - 30 hold - Standing Shoulder Row with Anchored Resistance  - 1 x daily -  7 x weekly - 2 sets - 10 reps - Shoulder extension with resistance - Neutral  - 1 x daily - 7 x weekly - 2 sets - 10 reps - Standing Shoulder Horizontal Abduction with Resistance  - 1 x daily - 7 x weekly - 2 sets - 10 reps - Shoulder Flexion Serratus Activation with Resistance  - 1 x daily - 7 x weekly - 2 sets - 10 reps - Standing Shoulder External Rotation with Resistance  - 1 x daily - 7 x weekly - 2 sets - 10 reps  ASSESSMENT:  CLINICAL IMPRESSION: started OH and push up activities with educ to set shld back with [email or web address] to stop with pain and not  overdo with trainer as he slowly resumes full activity.stressed stretching before and after. Edus on sleeping positions to not aggravate shld and he VU   EVAL: Patient is a 73 y.o. male who was seen today for physical therapy evaluation and treatment for L shoulder pain. He has pain with flexion and abduction motions. Also has some pain with resisted abduction. He presents with positive painful arc and Vonzell Berber tests,  indicating possible impingement syndrome. His doctor thinks he may have some rotator cuff tendinosis. Patient works out with a Warehouse manager a week and reports she is very good about modifying exercises and stopping if he has pain. He will benefit from skilled PT to address his shoulder pain with modalities, postural strengthening, and stretching to allow him to continue with his training without pain.   OBJECTIVE IMPAIRMENTS: impaired UE functional use, improper body mechanics, postural dysfunction, and pain.   ACTIVITY LIMITATIONS: carrying, lifting, and reach over head  PARTICIPATION LIMITATIONS: cleaning, yard work, and gym  REHAB POTENTIAL: Good  CLINICAL DECISION MAKING: Stable/uncomplicated  EVALUATION COMPLEXITY: Low  GOALS: Goals reviewed with patient? Yes  SHORT TERM GOALS: Target date: 11/20/23  Patient will be independent with initial HEP.  Baseline:  Goal status: MET 10/31/23   LONG TERM GOALS: Target date: 12/25/23  Patient will be independent with advanced/ongoing HEP to improve outcomes and carryover.  Baseline:  Goal status: 11/21/23 progressing  2.  Patient will report 75% improvement in L shoulder pain to improve QOL.  Baseline: 7/10 Goal status: progressing 10/31/23  60% 11/09/23 progressing  and 11/21/23  3.  Patient will be able to work out at the gym without pain in shoulder  Baseline:  Goal status: progressing 10/31/23 and 11/09/23 progressed 11/21/23  4.  Patient will demonstrate negative shoulder impingement testing Baseline: + painful arc and Hawkins Goal status: progressing 10/31/23 and 11/09/23   PLAN:  PT FREQUENCY: 1-2x/week  PT DURATION: 10 weeks  PLANNED INTERVENTIONS: 97110-Therapeutic exercises, 97530- Therapeutic activity, 97112- Neuromuscular re-education, 97535- Self Care, 02859- Manual therapy, 97016- Vasopneumatic device, [id number]- Ultrasound, [id number]- Ionotophoresis 4mg /ml Dexamethasone , 79439 (1-2 muscles), 20561 (3+ muscles)- Dry Needling,  Patient/Family education, Taping, Joint mobilization, Joint manipulation, Spinal manipulation, Spinal mobilization, Cryotherapy, and Moist heat  PLAN FOR NEXT SESSION: decrease to 1 x a week and up gym ex Angie Ashely Joshua PTA 11/21/23 7:54 AM

## 2023-11-24 ENCOUNTER — Ambulatory Visit

## 2023-11-28 ENCOUNTER — Other Ambulatory Visit: Payer: Self-pay

## 2023-11-28 ENCOUNTER — Ambulatory Visit: Admission: EM | Admit: 2023-11-28 | Discharge: 2023-11-28 | Disposition: A | Discharge status: Home / Self Care

## 2023-11-28 DIAGNOSIS — R0981 Nasal congestion: Secondary | ICD-10-CM | POA: Diagnosis not present

## 2023-11-28 DIAGNOSIS — J3489 Other specified disorders of nose and nasal sinuses: Secondary | ICD-10-CM

## 2023-11-28 NOTE — ED Triage Notes (Addendum)
 Pt presents with complaints of nasal congestion and pressure behind both eyes x 2 days. Currently rates overall facial pain a 7/10. States the pressure is worse on the left side of face. OTC Ibuprofen + Benadryl  taken with no improvement in symptoms. Denies fevers and sick contacts.

## 2023-11-28 NOTE — ED Provider Notes (Signed)
 [facility]    CSN: [id number] Arrival date & time: 11/28/23  1431      History   Chief Complaint Chief Complaint  Patient presents with   Nasal Congestion    HPI SOVEREIGN RAMIRO is a 73 y.o. male.   HPI   Pt states he has been having sinus pressure, nasal congestion and sinus pain that is radiating in to the left jaw that started about 2 days ago He denies coughing, fever, chills,  He reports some postnasal drainage along with voice changes He denies similar symptoms in close contacts. He denies recent travel Interventions: took a benadryl  today before work and ibuprofen      Past Medical History:  Diagnosis Date   Arthritis    Carotid artery occlusion    DVT (deep venous thrombosis) (HCC)    left leg   Hyperlipidemia    Hypertension    Peripheral vascular disease (HCC)    Stroke [facility])     Patient Active Problem List   Diagnosis Date Noted   OSA (obstructive sleep apnea) 06/06/2022   Carotid artery stenosis 11/22/2021   Symptomatic carotid artery stenosis, right 11/16/2021   Acute right MCA stroke (HCC) 09/28/2021   CVA (cerebral vascular accident) (HCC) 09/27/2021   Hyperlipidemia    Essential hypertension 03/01/2021   Gastroesophageal reflux disease without esophagitis 03/01/2021   Chronic headache 03/01/2021   Low back pain 03/01/2021   Partial retinal artery occlusion, right eye 03/01/2021   Pure hypercholesterolemia 03/01/2021   Retinal artery occlusion 03/01/2021   Carotid stenosis 03/01/2021    Past Surgical History:  Procedure Laterality Date   BUBBLE STUDY  09/30/2021   Procedure: BUBBLE STUDY;  Surgeon: Mona Vinie BROCKS, MD;  Location: [facility] ENDOSCOPY;  Service: Cardiovascular;;   HERNIA REPAIR     TEE WITHOUT CARDIOVERSION N/A 09/30/2021   Procedure: TRANSESOPHAGEAL ECHOCARDIOGRAM (TEE);  Surgeon: Mona Vinie BROCKS, MD;  Location: [facility] ENDOSCOPY;  Service: Cardiovascular;  Laterality: N/A;   TRANSCAROTID ARTERY REVASCULARIZATION   Right 11/22/2021   Procedure: RIGHT TRANSCAROTID ARTERY REVASCULARIZATION;  Surgeon: Gretta Lonni PARAS, MD;  Location: [facility] OR;  Service: Vascular;  Laterality: Right;   ULTRASOUND GUIDANCE FOR VASCULAR ACCESS Left 11/22/2021   Procedure: ULTRASOUND GUIDANCE FOR VASCULAR ACCESS;  Surgeon: Gretta Lonni PARAS, MD;  Location: [facility] OR;  Service: Vascular;  Laterality: Left;       Home Medications    Prior to Admission medications   Medication Sig Start Date End Date Taking? Authorizing Provider  amLODipine  (NORVASC ) 5 MG tablet Take 1 tablet (5 mg total) by mouth daily for 30 days. Patient taking differently: Take 5 mg by mouth at bedtime. 08/15/18 10/03/23  Norris Will PARAS, PA-C  Ascorbic Acid (VITAMIN C) 1000 MG tablet Take 1,000 mg by mouth daily.    [provider]  cholecalciferol (VITAMIN D3) 25 MCG (1000 UNIT) tablet Take 1,000 Units by mouth daily.    [provider]  clopidogrel  (PLAVIX ) 75 MG tablet Take 1 tablet (75 mg total) by mouth daily. Patient taking differently: Take 75 mg by mouth at bedtime. 09/29/21   Rojelio Nest, DO  famotidine -calcium  carbonate-magnesium  hydroxide (PEPCID  COMPLETE) 10-800-165 MG chewable tablet Chew 1 tablet by mouth at bedtime.    [provider]  Magnesium  250 MG TABS Take 250 mg by mouth daily.    [provider]  Multiple Vitamin (MULTI VITAMIN) TABS Take 1 tablet by mouth daily. Centrum Silver    [provider]  nortriptyline  (PAMELOR ) 10  MG capsule Take 1 capsule (10 mg total) by mouth at bedtime. 01/17/23   Skeet Juliene SAUNDERS, DO  rosuvastatin  (CRESTOR ) 40 MG tablet TAKE 1 TABLET(40 MG) BY MOUTH DAILY 09/12/22   Sheree Penne Bruckner, MD  Study - OCEANIC-STROKE - asundexian 50 mg or placebo tablet (PI-Sethi) Take 1 tablet (50 mg total) by mouth daily. For Investigational Use Only. Take at the same time each day (preferably in the morning). Tablet should be swallowed whole with water; it CANNOT be crushed or  broken. Please contact [facility] for any questions or concerns regarding this medication. 09/25/23   Rosemarie Eather RAMAN, MD    Family History Family History  Problem Relation Age of Onset   Heart attack Mother    Heart disease Mother    Heart disease Father    Stroke Father     Social History Social History   Tobacco Use   Smoking status: Former    Current packs/day: 0.00    Types: Cigarettes    Start date: 04/1976    Quit date: 04/1986    Years since quitting: 37.6   Smokeless tobacco: Never  Vaping Use   Vaping status: Never Used  Substance Use Topics   Alcohol use: Yes    Alcohol/week: 7.0 standard drinks of alcohol    Types: 7 Glasses of wine per week   Drug use: Never     Allergies   Patient has no known allergies.   Review of Systems Review of Systems  HENT:  Positive for congestion, sinus pressure and sinus pain.      Physical Exam Triage Vital Signs ED Triage Vitals  Encounter Vitals Group     BP 11/28/23 1452 113/71     Girls Systolic BP Percentile --      Girls Diastolic BP Percentile --      Boys Systolic BP Percentile --      Boys Diastolic BP Percentile --      Pulse Rate 11/28/23 1452 79     Resp 11/28/23 1452 18     Temp 11/28/23 1452 98.4 F (36.9 C)     Temp Source 11/28/23 1452 Oral     SpO2 11/28/23 1452 95 %     Weight 11/28/23 1454 190 lb (86.2 kg)     Height 11/28/23 1454 5' 10 (1.778 m)     Head Circumference --      Peak Flow --      Pain Score 11/28/23 1454 7     Pain Loc --      Pain Education --      Exclude from Growth Chart --    No data found.  Updated Vital Signs BP 113/71 (BP Location: Right Arm)   Pulse 79   Temp 98.4 F (36.9 C) (Oral)   Resp 18   Ht 5' 10 (1.778 m)   Wt 190 lb (86.2 kg)   SpO2 95%   BMI 27.26 kg/m   Visual Acuity Right Eye Distance:   Left Eye Distance:   Bilateral Distance:    Right Eye Near:   Left Eye Near:    Bilateral Near:     Physical Exam Vitals  reviewed.  Constitutional:      General: He is awake.     Appearance: Normal appearance. He is well-developed and well-groomed.  HENT:     Head: Normocephalic and atraumatic.     Right Ear: Hearing, tympanic membrane and ear canal normal.     Left Ear:  Hearing, tympanic membrane and ear canal normal.     Nose: No nasal tenderness, congestion or rhinorrhea.     Right Turbinates: Not enlarged or swollen.     Left Turbinates: Not enlarged or swollen.     Left Sinus: Maxillary sinus tenderness and frontal sinus tenderness present.     Mouth/Throat:     Lips: Pink.     Mouth: Mucous membranes are moist.     Pharynx: Oropharynx is clear. Uvula midline. No pharyngeal swelling, oropharyngeal exudate, posterior oropharyngeal erythema, uvula swelling or postnasal drip.     Tonsils: No tonsillar exudate or tonsillar abscesses.  Eyes:     General: Lids are normal. Gaze aligned appropriately.     Extraocular Movements: Extraocular movements intact.  Cardiovascular:     Rate and Rhythm: Normal rate and regular rhythm.     Heart sounds: Normal heart sounds.  Pulmonary:     Effort: Pulmonary effort is normal.     Breath sounds: Normal breath sounds. No decreased air movement. No decreased breath sounds, wheezing, rhonchi or rales.  Musculoskeletal:     Cervical back: Normal range of motion and neck supple.  Lymphadenopathy:     Head:     Right side of head: No submental, submandibular or preauricular adenopathy.     Left side of head: No submental, submandibular or preauricular adenopathy.     Cervical:     Right cervical: No superficial cervical adenopathy.    Left cervical: No superficial cervical adenopathy.     Upper Body:     Right upper body: No supraclavicular adenopathy.     Left upper body: No supraclavicular adenopathy.  Skin:    General: Skin is warm and dry.  Neurological:     General: No focal deficit present.     Mental Status: He is alert and oriented to person, place, and  time.  Psychiatric:        Mood and Affect: Mood normal.        Behavior: Behavior normal. Behavior is cooperative.        Thought Content: Thought content normal.        Judgment: Judgment normal.      UC Treatments / Results  Labs (all labs ordered are listed, but only abnormal results are displayed) Labs Reviewed - No data to display  EKG   Radiology No results found.  Procedures Procedures (including critical care time)  Medications Ordered in UC Medications - No data to display  Initial Impression / Assessment and Plan / UC Course  I have reviewed the triage vital signs and the nursing notes.  Pertinent labs & imaging results that were available during my care of the patient were reviewed by me and considered in my medical decision making (see chart for details).      Final Clinical Impressions(s) / UC Diagnoses   Final diagnoses:  Sinus pressure  Nasal congestion   Visit with patient indicates symptoms comprised of sinus congestion, sinus pressure, postnasal drainage for the past 2 days  congruent with acute URI that is likely viral in nature  Patient has tested negative for COVID and flu in clinic today   Physical exam is largely reassuring.  Patient does not appear toxic.  There is mild tenderness to palpation of the left maxillary sinus but no evidence of bogginess or swelling.  Vitals are largely reassuring as well Due to nature and duration of symptoms recommended treatment regimen is symptomatic relief and follow up if needed Discussed  with patient the various viral and bacterial etiologies of current illness and appropriate course of treatment Discussed OTC medication options for multisymptom relief such as Dayquil/Nyquil, Theraflu, AlkaSeltzer, etc. Discussed return precautions if symptoms are not improving or worsen over next 5-7 days.       Discharge Instructions      Based on your described symptoms and the duration of symptoms it is likely  that you have a viral upper respiratory infection (often called a cold)  Symptoms can last for 3-10 days with lingering cough and intermittent symptoms lasting weeks after that.  The goal of treatment at this time is to reduce your symptoms and discomfort   I recommend using Robitussin and Mucinex  (regular formulations, nothing with decongestants or DM)  You can also use Tylenol  for body aches and fever reduction I also recommend adding an antihistamine to your daily regimen This includes medications like Claritin, Allegra, Zyrtec- the generics of these work very well and are usually less expensive I recommend using Flonase nasal spray - 2 puffs twice per day to help with your nasal congestion The antihistamines and Flonase can take a few weeks to provide significant relief from allergy symptoms but should start to provide some benefit soon. You can use a humidifier at night to help with preventing nasal dryness and irritation   If your symptoms are not improving or seem to be getting worse over the next 5 to 7 days you can always return here to urgent care or you can follow-up with your primary care provider for ongoing management Go to the ER if you begin to have more serious symptoms such as shortness of breath, trouble breathing, loss of consciousness, swelling around the eyes, high fever, severe lasting headaches, vision changes or neck pain/stiffness.       ED Prescriptions   None    PDMP not reviewed this encounter.   Marylene Rocky BRAVO, PA-C 11/28/23 1531

## 2023-11-28 NOTE — Discharge Instructions (Signed)
 Based on your described symptoms and the duration of symptoms it is likely that you have a viral upper respiratory infection (often called a "cold")  Symptoms can last for 3-10 days with lingering cough and intermittent symptoms lasting weeks after that.  The goal of treatment at this time is to reduce your symptoms and discomfort   I recommend using Robitussin and Mucinex (regular formulations, nothing with decongestants or DM)  You can also use Tylenol for body aches and fever reduction I also recommend adding an antihistamine to your daily regimen This includes medications like Claritin, Allegra, Zyrtec- the generics of these work very well and are usually less expensive I recommend using Flonase nasal spray - 2 puffs twice per day to help with your nasal congestion The antihistamines and Flonase can take a few weeks to provide significant relief from allergy symptoms but should start to provide some benefit soon. You can use a humidifier at night to help with preventing nasal dryness and irritation   If your symptoms are not improving or seem to be getting worse over the next 5 to 7 days you can always return here to urgent care or you can follow-up with your primary care provider for ongoing management Go to the ER if you begin to have more serious symptoms such as shortness of breath, trouble breathing, loss of consciousness, swelling around the eyes, high fever, severe lasting headaches, vision changes or neck pain/stiffness.

## 2023-11-29 ENCOUNTER — Ambulatory Visit

## 2023-12-01 ENCOUNTER — Ambulatory Visit: Admitting: Physical Therapy

## 2023-12-01 DIAGNOSIS — M25612 Stiffness of left shoulder, not elsewhere classified: Secondary | ICD-10-CM

## 2023-12-01 DIAGNOSIS — G8929 Other chronic pain: Secondary | ICD-10-CM | POA: Diagnosis not present

## 2023-12-01 DIAGNOSIS — M67814 Other specified disorders of tendon, left shoulder: Secondary | ICD-10-CM | POA: Diagnosis not present

## 2023-12-01 DIAGNOSIS — M25812 Other specified joint disorders, left shoulder: Secondary | ICD-10-CM

## 2023-12-01 DIAGNOSIS — M25512 Pain in left shoulder: Secondary | ICD-10-CM | POA: Diagnosis not present

## 2023-12-01 NOTE — Therapy (Signed)
 OUTPATIENT PHYSICAL THERAPY SHOULDER TREATMENT    Patient Name: Ryan Franklin MRN: [id number] DOB:[birth year], 73 y.o., male Today's Date: 12/01/2023  END OF SESSION:  PT End of Session - 12/01/23 0848     Visit Number 9    Date for PT Re-Evaluation 12/25/23    Authorization Type [employer]    PT Start Time 0847    PT Stop Time 0925    PT Time Calculation (min) 38 min             Past Medical History:  Diagnosis Date   Arthritis    Carotid artery occlusion    DVT (deep venous thrombosis) (HCC)    left leg   Hyperlipidemia    Hypertension    Peripheral vascular disease (HCC)    Stroke [facility])    Past Surgical History:  Procedure Laterality Date   BUBBLE STUDY  09/30/2021   Procedure: BUBBLE STUDY;  Surgeon: Mona Vinie BROCKS, MD;  Location: [facility] ENDOSCOPY;  Service: Cardiovascular;;   HERNIA REPAIR     TEE WITHOUT CARDIOVERSION N/A 09/30/2021   Procedure: TRANSESOPHAGEAL ECHOCARDIOGRAM (TEE);  Surgeon: Mona Vinie BROCKS, MD;  Location: [facility] ENDOSCOPY;  Service: Cardiovascular;  Laterality: N/A;   TRANSCAROTID ARTERY REVASCULARIZATION  Right 11/22/2021   Procedure: RIGHT TRANSCAROTID ARTERY REVASCULARIZATION;  Surgeon: Gretta Lonni PARAS, MD;  Location[facility] OR;  Service: Vascular;  Laterality: Right;   ULTRASOUND GUIDANCE FOR VASCULAR ACCESS Left 11/22/2021   Procedure: ULTRASOUND GUIDANCE FOR VASCULAR ACCESS;  Surgeon: Gretta Lonni PARAS, MD;  Location: [facility] OR;  Service: Vascular;  Laterality: Left;   Patient Active Problem List   Diagnosis Date Noted   OSA (obstructive sleep apnea) 06/06/2022   Carotid artery stenosis 11/22/2021   Symptomatic carotid artery stenosis, right 11/16/2021   Acute right MCA stroke (HCC) 09/28/2021   CVA (cerebral vascular accident) (HCC) 09/27/2021   Hyperlipidemia    Essential hypertension 03/01/2021   Gastroesophageal reflux disease without esophagitis 03/01/2021   Chronic headache 03/01/2021   Low back pain 03/01/2021   Partial retinal  artery occlusion, right eye 03/01/2021   Pure hypercholesterolemia 03/01/2021   Retinal artery occlusion 03/01/2021   Carotid stenosis 03/01/2021    PCP: Elsie Lesches  REFERRING PROVIDER: Juliene Bailey  REFERRING DIAG:  [phone] (ICD-10-CM) - Pain in left shoulder    THERAPY DIAG:  Chronic left shoulder pain  Stiffness of left shoulder, not elsewhere classified  Impingement of left shoulder  Rationale for Evaluation and Treatment: Rehabilitation  ONSET DATE: 08/21/23  SUBJECTIVE:         really feeling pretty  good. Doing everything but pull ups and starting that MOnday at gym.                                                                                                                                      PERTINENT HISTORY: 5/12- Additionally, he has been having for a few months  some intermittent pain in the left shoulder with raising above his head. He does have some pain at night. It does come and go. It sounds more rotator cuff tendinopathic. We did provide him with home exercises. If this does not improve his symptoms he will follow-up for additional management. All questions were encouraged and answered.   6/18- In regards to the left shoulder I do believe this is more of some rotator cuff tendinosis. I will get him started in formal physical therapy. If this does not work we may consider further diagnostics or treatment. Follow-up in 4 to 6 weeks, earlier if needed. All questions were encouraged and answered.   PAIN:  Are you having pain? Yes: NPRS scale: 1/10 Pain location: L shoulder OK, R shoulder hurts  Pain description: same sort of sharp not constant Aggravating factors: raising my arm up or out to side, free weights, lifting, direction of exercise makes a lot of difference  Relieving factors: Ibuprofen, ice   PRECAUTIONS: None  RED FLAGS: None   WEIGHT BEARING RESTRICTIONS: No  FALLS:  Has patient fallen in last 6 months? No  LIVING ENVIRONMENT: Lives  with: lives alone Lives in: House/apartment  OCCUPATION: Self employed   PLOF: Independent  PATIENT GOALS: be able to work out in my full extent   NEXT MD VISIT:   OBJECTIVE:  Note: Objective measures were completed at Evaluation unless otherwise noted.  DIAGNOSTIC FINDINGS:  N/A  PATIENT SURVEYS:  QuickDash 9.1/100 = 9.1%  COGNITION: Overall cognitive status: Within functional limits for tasks assessed     SENSATION: WFL  POSTURE: Slightly rounded shoulders  UPPER EXTREMITY ROM: WFL, but pain with ABD and IR    UPPER EXTREMITY MMT: 5/5 some pain with resisted abduction   SHOULDER SPECIAL TESTS: Impingement tests: Neer impingement test: negative, Hawkins/Kennedy impingement test: positive , and Painful arc test: positive   JOINT MOBILITY TESTING:  Full PROM                                                                                                                            TREATMENT DATE:   12/01/23 PNF 7# 2 sets 10 standing Standing ER 7# 2 sets 10 Kettle bell around the back 10 x each way 7# flex into abd 10x 7# abd into flex 10 x Plank alt flex 20 x 4# 6# wall angles 10 x 6# 4 pt stab 10 x each 20# cable pulley IR 2 sets 10 BIL 15# cable pulley ER 2 sets 10 BIL 45# upright row 2 sets 10 25# cable pulley ext 2 sets 10 35# row 2 sets 10  11/21/23 Walking planks 10 x led RT . 10 x led left Push ups 8 x Plank BOSU -various ex 10# OH press 2 sets 10 Cable pulley PNF 45# 6# 3 pt rhy stab 12 x at wall- pain at W I,T ,Y 6# left UE Joint capsul stretching     11/15/23: Reassessed ER lag  sign B, pt with mild pain L shoulder, also tender to palpate over long head L biceps Manual: supine for inferior glides L humeral head, also  Lat glides humeral head while stretching L post shoulder capsule into L horizontal adduction, utilizing belt for better isolation  Inst in self post L shoulder capsule stretch with thread the needle, also with supine  horizontal L shoulder adduction , combined with L LTR  Rhythmic stabilization: Body blade L hand various positions,  Added shoulder ext with body blade B Adapted B shoulder ER with theraband to incorporate fast movements short amplitude for better control, rhythmic stab Serratus punches with 7# B mass practice Education to stretch post L shoulder capsule before strengthening UE with trainer  11/09/23 Assessed and documented goals FULL ,PAINFREE ROM. MMT ABD 5/5 without pain Body Blade- various directions Rhy stab with wt ball 15 x flex abd abd Yellow ball rolls on wall CW and CCW 15 x Serratus punch 25# 2 sets 15 Single arm chest press 10 x 2 sets 20# Nustep standing push/pull 2 min the horz abd 2 min L 5 Seated BIL row, horz abd and shld ext 6# 2 sets 10 Empty can 6# 2 sets 10 PNF 6# 2 sets 10 Tricep ext 75# 2 sets 15 Bicep Curl 35# 2 sets 15 Upright Row 35# 2 sets 15  Ionto patch #5 anterior L shoulder      11/07/23  UBE L3x4 minutes forward/backward for w/u    Erika 6 2# x12 each  Supine serratus punches 7# x12 L Supine serratus CW/CCW circles 7# 2x12  Supine serratus alphabet 7# x3 rounds  Body blade horizontal and vertical shakes 2x30 seconds L  Ionto patches B anterior shoulders 1.38mL 4 hour wear time     11/02/23 UBE L 3 each way 4# seated row ,shld ext and horz abd 2 sets 10 4# standing shld flex,abd ( 2 angles ) 10 each 4# postural wall angles 5 x 4# empty can 2 sets 10 left 4# PNF 2 sets 10 Left Cable pulley IR 15# 2 sets 10, ER 10# 2 sets 10 4# 4 pt rhy stab 10 x each FULL and = ROM. MMT WFLS with pain on Left 4# flex into abd 10 x then reverse 10 x PROM with end range stretch for pect and ER Educ in dorrway stretching Educ in avoiding chesy press,OH and push ups for now at gym Ionto patch #3 anterior L shoulder     10/31/23 UBE 3 min each way L 3 4# empty can 2 sets 10 Left 4# PNF 2 sets 10 Left 4# seated horz abd 2 sets 10 Left   Green tband IR and ER 2 sets 10 Left  Red tband 3 way stab 10 x BIL- fatigue 4# 4pt rhy stab 10 x - pain with position 3-abd 4# shld flex into abd 10 x, reverse 10 x each- pain with abd Ionto patch #2 anterior L shoulder (4 hour patch/1.43mL dex), reviewed on sx that would indicate removing patch early, wear/removal time     10/18/23  UBE L5 x3 min forward/backward  Prone blackburn 6 x12 each 0# 3 way reaches on wall red TB x10 B limited by shoulder pain with exercise  Corner stretch 3x30 seconds  Thoracic 3D excursions x10 all directions   Ionto patch #1 anterior L shoulder (4 hour patch/1.52mL dex), education on sx that would indicate removing patch early, wear/removal time     10/16/23 EVAL   PATIENT EDUCATION:  Education details: POC, HEP, shoulder anatomy  Person educated: Patient Education method: Explanation and Demonstration Education comprehension: verbalized understanding and returned demonstration  HOME EXERCISE PROGRAM: Access Code: [email or web address] URL: [email or web address] Date: 10/16/2023 Prepared by: Almetta Fam  Exercises - Doorway Pec Stretch at 90 Degrees Abduction  - 1 x daily - 7 x weekly - 2 reps - 30 hold - Standing Shoulder Row with Anchored Resistance  - 1 x daily - 7 x weekly - 2 sets - 10 reps - Shoulder extension with resistance - Neutral  - 1 x daily - 7 x weekly - 2 sets - 10 reps - Standing Shoulder Horizontal Abduction with Resistance  - 1 x daily - 7 x weekly - 2 sets - 10 reps - Shoulder Flexion Serratus Activation with Resistance  - 1 x daily - 7 x weekly - 2 sets - 10 reps - Standing Shoulder External Rotation with Resistance  - 1 x daily - 7 x weekly - 2 sets - 10 reps  ASSESSMENT:  CLINICAL IMPRESSION: Pt arrived feeling good and feeling like he could be done with PT . Doing all ex at gym pain freee except pull up and starting that next week. Progressed all ex today and he did well with self postural correction and no pain today,  mild fatigue. All goals met except resume all gym ex so will hold 2 weeks and have him return to all gym ex. Educ on adding rotators into work outs and continue with postural stab ex and he VU  OBJECTIVE IMPAIRMENTS: impaired UE functional use, improper body mechanics, postural dysfunction, and pain.   ACTIVITY LIMITATIONS: carrying, lifting, and reach over head  PARTICIPATION LIMITATIONS: cleaning, yard work, and gym  REHAB POTENTIAL: Good  CLINICAL DECISION MAKING: Stable/uncomplicated  EVALUATION COMPLEXITY: Low  GOALS: Goals reviewed with patient? Yes  SHORT TERM GOALS: Target date: 11/20/23  Patient will be independent with initial HEP.  Baseline:  Goal status: MET 10/31/23   LONG TERM GOALS: Target date: 12/25/23  Patient will be independent with advanced/ongoing HEP to improve outcomes and carryover.  Baseline:  Goal status: 11/21/23 progressing  12/01/23 MET  2.  Patient will report 75% improvement in L shoulder pain to improve QOL.  Baseline: 7/10 Goal status: progressing 10/31/23  60% 11/09/23 progressing  and 11/21/23  12/01/23 MET  3.  Patient will be able to work out at the gym without pain in shoulder  Baseline:  Goal status: progressing 10/31/23 and 11/09/23 progressed 11/21/23  and 12/01/23  4.  Patient will demonstrate negative shoulder impingement testing Baseline: + painful arc and Hawkins Goal status: progressing 10/31/23 and 11/09/23  MET 12/01/23   PLAN:  PT FREQUENCY: 1-2x/week  PT DURATION: 10 weeks  PLANNED INTERVENTIONS: 97110-Therapeutic exercises, 97530- Therapeutic activity, 97112- Neuromuscular re-education, 97535- Self Care, 02859- Manual therapy, 97016- Vasopneumatic device, [id number]- Ultrasound, [id number]- Ionotophoresis 4mg /ml Dexamethasone , 79439 (1-2 muscles), 20561 (3+ muscles)- Dry Needling, Patient/Family education, Taping, Joint mobilization, Joint manipulation, Spinal manipulation, Spinal mobilization, Cryotherapy, and Moist heat  PLAN FOR NEXT  SESSION: HOLD 2 weeks and resume all gym ex Angie Marleen Moret PTA 12/01/23 9:23 AM

## 2023-12-05 ENCOUNTER — Ambulatory Visit: Admitting: Physical Therapy

## 2023-12-07 ENCOUNTER — Ambulatory Visit: Admitting: Physical Therapy

## 2023-12-19 ENCOUNTER — Ambulatory Visit: Attending: Sports Medicine | Admitting: Physical Therapy

## 2023-12-19 DIAGNOSIS — M25812 Other specified joint disorders, left shoulder: Secondary | ICD-10-CM | POA: Insufficient documentation

## 2023-12-19 NOTE — Therapy (Signed)
 OUTPATIENT PHYSICAL THERAPY SHOULDER TREATMENT    Patient Name: Ryan Franklin MRN: [id number] DOB:1950/07/05, 73 y.o., male Today's Date: 12/19/2023  END OF SESSION:  PT End of Session - 12/19/23 0800     Visit Number 10    Date for PT Re-Evaluation 12/25/23    PT Start Time 0800    PT Stop Time 0830    PT Time Calculation (min) 30 min             Past Medical History:  Diagnosis Date   Arthritis    Carotid artery occlusion    DVT (deep venous thrombosis) (HCC)    left leg   Hyperlipidemia    Hypertension    Peripheral vascular disease (HCC)    Stroke [facility])    Past Surgical History:  Procedure Laterality Date   BUBBLE STUDY  09/30/2021   Procedure: BUBBLE STUDY;  Surgeon: Mona Vinie BROCKS, MD;  Location: [facility] ENDOSCOPY;  Service: Cardiovascular;;   HERNIA REPAIR     TEE WITHOUT CARDIOVERSION N/A 09/30/2021   Procedure: TRANSESOPHAGEAL ECHOCARDIOGRAM (TEE);  Surgeon: Mona Vinie BROCKS, MD;  Location: [facility] ENDOSCOPY;  Service: Cardiovascular;  Laterality: N/A;   TRANSCAROTID ARTERY REVASCULARIZATION  Right 11/22/2021   Procedure: RIGHT TRANSCAROTID ARTERY REVASCULARIZATION;  Surgeon: Gretta Lonni PARAS, MD;  Location: [facility] OR;  Service: Vascular;  Laterality: Right;   ULTRASOUND GUIDANCE FOR VASCULAR ACCESS Left 11/22/2021   Procedure: ULTRASOUND GUIDANCE FOR VASCULAR ACCESS;  Surgeon: Gretta Lonni PARAS, MD;  Location: [facility] OR;  Service: Vascular;  Laterality: Left;   Patient Active Problem List   Diagnosis Date Noted   OSA (obstructive sleep apnea) 06/06/2022   Carotid artery stenosis 11/22/2021   Symptomatic carotid artery stenosis, right 11/16/2021   Acute right MCA stroke (HCC) 09/28/2021   CVA (cerebral vascular accident) (HCC) 09/27/2021   Hyperlipidemia    Essential hypertension 03/01/2021   Gastroesophageal reflux disease without esophagitis 03/01/2021   Chronic headache 03/01/2021   Low back pain 03/01/2021   Partial retinal artery occlusion, right eye  03/01/2021   Pure hypercholesterolemia 03/01/2021   Retinal artery occlusion 03/01/2021   Carotid stenosis 03/01/2021    PCP: Elsie Lesches  REFERRING PROVIDER: Juliene Bailey  REFERRING DIAG:  [phone] (ICD-10-CM) - Pain in left shoulder    THERAPY DIAG:  Impingement of left shoulder  Rationale for Evaluation and Treatment: Rehabilitation  ONSET DATE: 08/21/23  SUBJECTIVE:        doing good. Everything is great at gym                                                                                             PERTINENT HISTORY: 5/12- Additionally, he has been having for a few months some intermittent pain in the left shoulder with raising above his head. He does have some pain at night. It does come and go. It sounds more rotator cuff tendinopathic. We did provide him with home exercises. If this does not improve his symptoms he will follow-up for additional management. All questions were encouraged and answered.   6/18- In regards to the left shoulder I do believe this is  more of some rotator cuff tendinosis. I will get him started in formal physical therapy. If this does not work we may consider further diagnostics or treatment. Follow-up in 4 to 6 weeks, earlier if needed. All questions were encouraged and answered.   PAIN:  Are you having pain? Yes: NPRS scale: 1/10 Pain location: L shoulder OK, R shoulder hurts  Pain description: same sort of sharp not constant Aggravating factors: raising my arm up or out to side, free weights, lifting, direction of exercise makes a lot of difference  Relieving factors: Ibuprofen, ice   PRECAUTIONS: None  RED FLAGS: None   WEIGHT BEARING RESTRICTIONS: No  FALLS:  Has patient fallen in last 6 months? No  LIVING ENVIRONMENT: Lives with: lives alone Lives in: House/apartment  OCCUPATION: Self employed   PLOF: Independent  PATIENT GOALS: be able to work out in my full extent   NEXT MD VISIT:   OBJECTIVE:  Note: Objective  measures were completed at Evaluation unless otherwise noted.  DIAGNOSTIC FINDINGS:  N/A  PATIENT SURVEYS:  QuickDash 9.1/100 = 9.1%  COGNITION: Overall cognitive status: Within functional limits for tasks assessed     SENSATION: WFL  POSTURE: Slightly rounded shoulders  UPPER EXTREMITY ROM: WFL, but pain with ABD and IR    UPPER EXTREMITY MMT: 5/5 some pain with resisted abduction   SHOULDER SPECIAL TESTS: Impingement tests: Neer impingement test: negative, Hawkins/Kennedy impingement test: positive , and Painful arc test: positive   JOINT MOBILITY TESTING:  Full PROM                                                                                                                            TREATMENT DATE:   12/19/23 8# UE circuit 8# PNF 8# IR /ER 2 sets 10 20# shld ext pulleys 2 sest 10 35# row 2 sets 10 Push up variations Wt ball tosses 10# shld ext, row and horz abd   12/01/23 PNF 7# 2 sets 10 standing Standing ER 7# 2 sets 10 Kettle bell around the back 10 x each way 7# flex into abd 10x 7# abd into flex 10 x Plank alt flex 20 x 4# 6# wall angles 10 x 6# 4 pt stab 10 x each 20# cable pulley IR 2 sets 10 BIL 15# cable pulley ER 2 sets 10 BIL 45# upright row 2 sets 10 25# cable pulley ext 2 sets 10 35# row 2 sets 10  11/21/23 Walking planks 10 x led RT . 10 x led left Push ups 8 x Plank BOSU -various ex 10# OH press 2 sets 10 Cable pulley PNF 45# 6# 3 pt rhy stab 12 x at wall- pain at W I,T ,Y 6# left UE Joint capsul stretching     11/15/23: Reassessed ER lag sign B, pt with mild pain L shoulder, also tender to palpate over long head L biceps Manual: supine for inferior glides L humeral head, also  Lat glides humeral head  while stretching L post shoulder capsule into L horizontal adduction, utilizing belt for better isolation  Inst in self post L shoulder capsule stretch with thread the needle, also with supine horizontal L shoulder adduction ,  combined with L LTR  Rhythmic stabilization: Body blade L hand various positions,  Added shoulder ext with body blade B Adapted B shoulder ER with theraband to incorporate fast movements short amplitude for better control, rhythmic stab Serratus punches with 7# B mass practice Education to stretch post L shoulder capsule before strengthening UE with trainer  11/09/23 Assessed and documented goals FULL ,PAINFREE ROM. MMT ABD 5/5 without pain Body Blade- various directions Rhy stab with wt ball 15 x flex abd abd Yellow ball rolls on wall CW and CCW 15 x Serratus punch 25# 2 sets 15 Single arm chest press 10 x 2 sets 20# Nustep standing push/pull 2 min the horz abd 2 min L 5 Seated BIL row, horz abd and shld ext 6# 2 sets 10 Empty can 6# 2 sets 10 PNF 6# 2 sets 10 Tricep ext 75# 2 sets 15 Bicep Curl 35# 2 sets 15 Upright Row 35# 2 sets 15  Ionto patch #5 anterior L shoulder      11/07/23  UBE L3x4 minutes forward/backward for w/u    Erika 6 2# x12 each  Supine serratus punches 7# x12 L Supine serratus CW/CCW circles 7# 2x12  Supine serratus alphabet 7# x3 rounds  Body blade horizontal and vertical shakes 2x30 seconds L  Ionto patches B anterior shoulders 1.25mL 4 hour wear time     11/02/23 UBE L 3 each way 4# seated row ,shld ext and horz abd 2 sets 10 4# standing shld flex,abd ( 2 angles ) 10 each 4# postural wall angles 5 x 4# empty can 2 sets 10 left 4# PNF 2 sets 10 Left Cable pulley IR 15# 2 sets 10, ER 10# 2 sets 10 4# 4 pt rhy stab 10 x each FULL and = ROM. MMT WFLS with pain on Left 4# flex into abd 10 x then reverse 10 x PROM with end range stretch for pect and ER Educ in dorrway stretching Educ in avoiding chesy press,OH and push ups for now at gym Ionto patch #3 anterior L shoulder     10/31/23 UBE 3 min each way L 3 4# empty can 2 sets 10 Left 4# PNF 2 sets 10 Left 4# seated horz abd 2 sets 10 Left  Green tband IR and ER 2 sets 10  Left  Red tband 3 way stab 10 x BIL- fatigue 4# 4pt rhy stab 10 x - pain with position 3-abd 4# shld flex into abd 10 x, reverse 10 x each- pain with abd Ionto patch #2 anterior L shoulder (4 hour patch/1.54mL dex), reviewed on sx that would indicate removing patch early, wear/removal time     10/18/23  UBE L5 x3 min forward/backward  Prone blackburn 6 x12 each 0# 3 way reaches on wall red TB x10 B limited by shoulder pain with exercise  Corner stretch 3x30 seconds  Thoracic 3D excursions x10 all directions   Ionto patch #1 anterior L shoulder (4 hour patch/1.65mL dex), education on sx that would indicate removing patch early, wear/removal time     10/16/23 EVAL   PATIENT EDUCATION: Education details: POC, HEP, shoulder anatomy  Person educated: Patient Education method: Explanation and Demonstration Education comprehension: verbalized understanding and returned demonstration  HOME EXERCISE PROGRAM: Access Code: [email or web address] URL: [email or web address]  Date: 10/16/2023 Prepared by: Almetta Fam  Exercises - Doorway Pec Stretch at 90 Degrees Abduction  - 1 x daily - 7 x weekly - 2 reps - 30 hold - Standing Shoulder Row with Anchored Resistance  - 1 x daily - 7 x weekly - 2 sets - 10 reps - Shoulder extension with resistance - Neutral  - 1 x daily - 7 x weekly - 2 sets - 10 reps - Standing Shoulder Horizontal Abduction with Resistance  - 1 x daily - 7 x weekly - 2 sets - 10 reps - Shoulder Flexion Serratus Activation with Resistance  - 1 x daily - 7 x weekly - 2 sets - 10 reps - Standing Shoulder External Rotation with Resistance  - 1 x daily - 7 x weekly - 2 sets - 10 reps  ASSESSMENT:  CLINICAL IMPRESSION:  all goals met. During ex reviewed all gym ex and answered questions OBJECTIVE IMPAIRMENTS: impaired UE functional use, improper body mechanics, postural dysfunction, and pain.   ACTIVITY LIMITATIONS: carrying, lifting, and reach over head  PARTICIPATION  LIMITATIONS: cleaning, yard work, and gym  REHAB POTENTIAL: Good  CLINICAL DECISION MAKING: Stable/uncomplicated  EVALUATION COMPLEXITY: Low  GOALS: Goals reviewed with patient? Yes  SHORT TERM GOALS: Target date: 11/20/23  Patient will be independent with initial HEP.  Baseline:  Goal status: MET 10/31/23   LONG TERM GOALS: Target date: 12/25/23  Patient will be independent with advanced/ongoing HEP to improve outcomes and carryover.  Baseline:  Goal status: 11/21/23 progressing  12/01/23 MET  2.  Patient will report 75% improvement in L shoulder pain to improve QOL.  Baseline: 7/10 Goal status: progressing 10/31/23  60% 11/09/23 progressing  and 11/21/23  12/01/23 MET  3.  Patient will be able to work out at the gym without pain in shoulder  Baseline:  Goal status: progressing 10/31/23 and 11/09/23 progressed 11/21/23  and 12/01/23 12/19/23 MET  4.  Patient will demonstrate negative shoulder impingement testing Baseline: + painful arc and Hawkins Goal status: progressing 10/31/23 and 11/09/23  MET 12/01/23   PLAN:  PT FREQUENCY: 1-2x/week  PT DURATION: 10 weeks  PLANNED INTERVENTIONS: 97110-Therapeutic exercises, 97530- Therapeutic activity, 97112- Neuromuscular re-education, 97535- Self Care, 02859- Manual therapy, 97016- Vasopneumatic device, [id number]- Ultrasound, [id number]- Ionotophoresis 4mg /ml Dexamethasone , 79439 (1-2 muscles), 20561 (3+ muscles)- Dry Needling, Patient/Family education, Taping, Joint mobilization, Joint manipulation, Spinal manipulation, Spinal mobilization, Cryotherapy, and Moist heat  PLAN FOR NEXT SESSION: D/C  PHYSICAL THERAPY DISCHARGE SUMMARY  Patient agrees to discharge. Patient goals were met. Patient is being discharged due to meeting the stated rehab goals.  Angie Kent Bra[place] PTA 12/19/23 8:46 AM

## 2024-01-11 DIAGNOSIS — Z23 Encounter for immunization: Secondary | ICD-10-CM | POA: Diagnosis not present

## 2024-01-17 NOTE — Progress Notes (Deleted)
 NEUROLOGY FOLLOW UP OFFICE NOTE  Ryan Franklin [id number]  Assessment/Plan:   Scattered small ischemic infarcts in the right MCA territory.  Cardiology believed it was secondary to right ICA stenosis, now s/p stent. Headache, nonspecific, not intractable - with morning headaches, likely related to OSA but cannot tolerate CPAP but responding to nortriptyline  Hypertension Hyperlipidemia Gait instability - likely age-related.    Nortriptyline  10mg  at bedtime for headache prevention.   Secondary stroke prevention as managed by PCP: Plavix  75mg  daily Statin.  LDL goal less than 70 Normotensive blood pressure Hgb A1c goal less than 7 Management of OSA as per sleep medicine Mediterranean diet Routine exercise Follow up with Dr. Rosemarie regarding the Oceanic-Stroke Trial.  Follow up one year or as needed.    Subjective:  Ryan Franklin is a 73 year old right-handed male with HTN, HLD and PUD with history of GI bleed and DVT follows up for headache and stroke.  UPDATE: Current medications:  Plavix  75mg  daily, rosuvastatin  20mg  daily, nortriptyline  10mg  at bedtime  Last year that he sometimes may stumble, particularly if he turns too quickly.  Due to gait instability, he had a NCV-EMG on 02/14/2023 to assess for polyneuropathy which was normal.  Labs for neuropathy on 01/17/2023 revealed negative ANA and ENA panel, normal IFE pattern, normal TSH 2.43 and B12 268.    HISTORY: Stroke: On 09/26/2021, he had episode of lightheadedness with left leg numbness lasting 45 minutes.  Several days prior, he reported brief episode of left arm incoordination.  CT head showed no acute abnormality but follow up MRI revealed numerous scattered small acute infarcts in the right MCA territory.  CTA of head and neck showed atherosclerotic changes at the carotid bifurcations but no large vessel occlusion or hemodynamically significant stenosis.  2D echo with bubble study showed EF 60-65% with no  interatrial shunt.  LDL was 107 and Hgb A1c was 5.4.  LE venous doppler showed evidence of known old DVT in left popliteal but no acute DVT.  He was on ASA 81mg  prior to admission and discharged on ASA 81mg  and Plavix  75mg  daily for 21 days followed by Plavix  monotherapy.  Home rosuvastatin  was increased from 10mg  to 20mg  daily.  He also entered the Oceanic-Stroke trial (on asundexian vs placebo).  He was discharged and had an outpatient TEE on 6/22 which revealed calcification of the aortic valve but no significant valve disease, no thrombus, PFO or other cardiac source of emboli.  Since discharge, he has been feeling well.  This morning in the shower, he had a new pruritic rash which has since resolved.  Denies history of palpitations.  Headache: He began having new daily near-persistent headaches in early 2021.  They are usually a severe right-sided non-throbbing pain.  They sometimes wake him up in the middle of the night.  Sometimes he is bed-bound.  On rare occasions, he has noted brief flashes in his left eye.  There is no associated nausea, vomiting, photophobia, phonophobia, osmophobia, speech disturbance, dizziness, numbness or weakness.  He has some neck tightness.  He was initially taking ibuprofen and ASA daily, but later was changed to tramadol, which he rarely takes (only if needed) but does seem to dull the pain.  No specific triggers.   Sed rate on 10/07/2019 was 13.  MRI of brain with and without contrast on 12/10/2019 showed abnormal signal within the dorsal midbrain and pons and cerebellum, suspicious for toxic/metabolic disorders such as Wernicke encephalopathy.  MRA of head  was normal.  Denied heavy alcohol use.  Vitamin B1 level was 7 and he was advised to start thiamine 100mg  daily.  Follow up MRI of brain with and without contrast on 05/08/2020 showed no evidence of signal abnormality suggesting that the previous findings were likely artifactual.  Diagnosed with OSA but unable to tolerate  the CPAP.   He does have a history of different dull frontal headaches and ocular migraines (kaleidoscopic vision, no headache).  Past medications: Past NSAIDS/steroid:  ASA, ibuprofen, prednisone (can't take nsaids now due to Eliquis ) Past analgesics:  tramadol Past abortive triptans:  none Past abortive ergotamine:  none Past muscle relaxants:  none Past anti-emetic:  none Past antihypertensive medications:  none Past antidepressant medications:  none Past anticonvulsant medications:  none Past anti-CGRP:  Ubrelvy (ineffective) Past vitamins/Herbal/Supplements:  none Past antihistamines/decongestants:  none Other past therapies:  none   PAST MEDICAL HISTORY: Past Medical History:  Diagnosis Date   Arthritis    Carotid artery occlusion    DVT (deep venous thrombosis) (HCC)    left leg   Hyperlipidemia    Hypertension    Peripheral vascular disease    Stroke [facility])     MEDICATIONS: Current Outpatient Medications on File Prior to Visit  Medication Sig Dispense Refill   amLODipine  (NORVASC ) 5 MG tablet Take 1 tablet (5 mg total) by mouth daily for 30 days. (Patient taking differently: Take 5 mg by mouth at bedtime.) 30 tablet 0   Ascorbic Acid (VITAMIN C) 1000 MG tablet Take 1,000 mg by mouth daily.     cholecalciferol (VITAMIN D3) 25 MCG (1000 UNIT) tablet Take 1,000 Units by mouth daily.     clopidogrel  (PLAVIX ) 75 MG tablet Take 1 tablet (75 mg total) by mouth daily. (Patient taking differently: Take 75 mg by mouth at bedtime.) 30 tablet 2   famotidine -calcium  carbonate-magnesium  hydroxide (PEPCID  COMPLETE) 10-800-165 MG chewable tablet Chew 1 tablet by mouth at bedtime.     Magnesium  250 MG TABS Take 250 mg by mouth daily.     Multiple Vitamin (MULTI VITAMIN) TABS Take 1 tablet by mouth daily. Centrum Silver     nortriptyline  (PAMELOR ) 10 MG capsule Take 1 capsule (10 mg total) by mouth at bedtime. 30 capsule 11   rosuvastatin  (CRESTOR ) 40 MG tablet TAKE 1 TABLET(40 MG)  BY MOUTH DAILY 30 tablet 0   Study - OCEANIC-STROKE - asundexian 50 mg or placebo tablet (PI-Sethi) Take 1 tablet (50 mg total) by mouth daily. For Investigational Use Only. Take at the same time each day (preferably in the morning). Tablet should be swallowed whole with water; it CANNOT be crushed or broken. Please contact [facility] for any questions or concerns regarding this medication. 196 tablet 0   No current facility-administered medications on file prior to visit.     ALLERGIES: No Known Allergies  FAMILY HISTORY: Family History  Problem Relation Age of Onset   Heart attack Mother    Heart disease Mother    Heart disease Father    Stroke Father       Objective:  *** General: No acute distress.  Patient appears well-groomed.   Head:  Normocephalic/atraumatic Neck:  Supple.  No paraspinal tenderness.  Full range of motion. Heart:  Regular rate and rhythm. Neuro:  Alert and oriented.  Speech fluent and not dysarthric.  Language intact.  CN II-XII intact.  Bulk and tone normal.  Muscle strength 5/5 throughout.  Sensation to light touch intact.  Deep tendon reflexes  2+ throughout, toes downgoing.  Gait normal.  Romberg negative.    Juliene Dunnings, DO  CC: Elsie Lesches, MD

## 2024-01-18 ENCOUNTER — Ambulatory Visit: Payer: Medicare HMO | Admitting: Neurology

## 2024-01-22 NOTE — Progress Notes (Unsigned)
 NEUROLOGY FOLLOW UP OFFICE NOTE  Ryan Franklin [id number]  Assessment/Plan:   Scattered small ischemic infarcts in the right MCA territory.  Cardiology believed it was secondary to right ICA stenosis, now s/p stent. Headache, nonspecific, not intractable - with morning headaches, likely related to OSA but cannot tolerate CPAP but responding to nortriptyline  Hypertension Hyperlipidemia Gait instability - likely age-related.    Nortriptyline  10mg  at bedtime for headache prevention.   Secondary stroke prevention as managed by PCP: Plavix  75mg  daily Statin.  LDL goal less than 70 Normotensive blood pressure Hgb A1c goal less than 7 Management of OSA as per sleep medicine Mediterranean diet Routine exercise Follow up with Dr. Rosemarie regarding the Oceanic-Stroke Trial.  Follow up one year or as needed.    Subjective:  Ryan Franklin is a 73 year old right-handed male with HTN, HLD and PUD with history of GI bleed and DVT follows up for headache and stroke.  UPDATE: Current medications:  Plavix  75mg  daily, rosuvastatin  20mg  daily, nortriptyline  10mg  at bedtime  Last year that he sometimes may stumble, particularly if he turns too quickly.  Due to gait instability, he had a NCV-EMG on 02/14/2023 to assess for polyneuropathy which was normal.  Labs for neuropathy on 01/17/2023 revealed negative ANA and ENA panel, normal IFE pattern, normal TSH 2.43 and B12 268.    HISTORY: Stroke: On 09/26/2021, he had episode of lightheadedness with left leg numbness lasting 45 minutes.  Several days prior, he reported brief episode of left arm incoordination.  CT head showed no acute abnormality but follow up MRI revealed numerous scattered small acute infarcts in the right MCA territory.  CTA of head and neck showed atherosclerotic changes at the carotid bifurcations but no large vessel occlusion or hemodynamically significant stenosis.  2D echo with bubble study showed EF 60-65% with no  interatrial shunt.  LDL was 107 and Hgb A1c was 5.4.  LE venous doppler showed evidence of known old DVT in left popliteal but no acute DVT.  He was on ASA 81mg  prior to admission and discharged on ASA 81mg  and Plavix  75mg  daily for 21 days followed by Plavix  monotherapy.  Home rosuvastatin  was increased from 10mg  to 20mg  daily.  He also entered the Oceanic-Stroke trial (on asundexian vs placebo).  He was discharged and had an outpatient TEE on 6/22 which revealed calcification of the aortic valve but no significant valve disease, no thrombus, PFO or other cardiac source of emboli.  Since discharge, he has been feeling well.  This morning in the shower, he had a new pruritic rash which has since resolved.  Denies history of palpitations.  Headache: He began having new daily near-persistent headaches in early 2021.  They are usually a severe right-sided non-throbbing pain.  They sometimes wake him up in the middle of the night.  Sometimes he is bed-bound.  On rare occasions, he has noted brief flashes in his left eye.  There is no associated nausea, vomiting, photophobia, phonophobia, osmophobia, speech disturbance, dizziness, numbness or weakness.  He has some neck tightness.  He was initially taking ibuprofen and ASA daily, but later was changed to tramadol, which he rarely takes (only if needed) but does seem to dull the pain.  No specific triggers.   Sed rate on 10/07/2019 was 13.  MRI of brain with and without contrast on 12/10/2019 showed abnormal signal within the dorsal midbrain and pons and cerebellum, suspicious for toxic/metabolic disorders such as Wernicke encephalopathy.  MRA of head  was normal.  Denied heavy alcohol use.  Vitamin B1 level was 7 and he was advised to start thiamine 100mg  daily.  Follow up MRI of brain with and without contrast on 05/08/2020 showed no evidence of signal abnormality suggesting that the previous findings were likely artifactual.  Diagnosed with OSA but unable to tolerate  the CPAP.   He does have a history of different dull frontal headaches and ocular migraines (kaleidoscopic vision, no headache).  Past medications: Past NSAIDS/steroid:  ASA, ibuprofen, prednisone (can't take nsaids now due to Eliquis ) Past analgesics:  tramadol Past abortive triptans:  none Past abortive ergotamine:  none Past muscle relaxants:  none Past anti-emetic:  none Past antihypertensive medications:  none Past antidepressant medications:  none Past anticonvulsant medications:  none Past anti-CGRP:  Ubrelvy (ineffective) Past vitamins/Herbal/Supplements:  none Past antihistamines/decongestants:  none Other past therapies:  none   PAST MEDICAL HISTORY: Past Medical History:  Diagnosis Date   Arthritis    Carotid artery occlusion    DVT (deep venous thrombosis) (HCC)    left leg   Hyperlipidemia    Hypertension    Peripheral vascular disease    Stroke [facility])     MEDICATIONS: Current Outpatient Medications on File Prior to Visit  Medication Sig Dispense Refill   amLODipine  (NORVASC ) 5 MG tablet Take 1 tablet (5 mg total) by mouth daily for 30 days. (Patient taking differently: Take 5 mg by mouth at bedtime.) 30 tablet 0   Ascorbic Acid (VITAMIN C) 1000 MG tablet Take 1,000 mg by mouth daily.     cholecalciferol (VITAMIN D3) 25 MCG (1000 UNIT) tablet Take 1,000 Units by mouth daily.     clopidogrel  (PLAVIX ) 75 MG tablet Take 1 tablet (75 mg total) by mouth daily. (Patient taking differently: Take 75 mg by mouth at bedtime.) 30 tablet 2   famotidine -calcium  carbonate-magnesium  hydroxide (PEPCID  COMPLETE) 10-800-165 MG chewable tablet Chew 1 tablet by mouth at bedtime.     Magnesium  250 MG TABS Take 250 mg by mouth daily.     Multiple Vitamin (MULTI VITAMIN) TABS Take 1 tablet by mouth daily. Centrum Silver     nortriptyline  (PAMELOR ) 10 MG capsule Take 1 capsule (10 mg total) by mouth at bedtime. 30 capsule 11   rosuvastatin  (CRESTOR ) 40 MG tablet TAKE 1 TABLET(40 MG)  BY MOUTH DAILY 30 tablet 0   Study - OCEANIC-STROKE - asundexian 50 mg or placebo tablet (PI-Sethi) Take 1 tablet (50 mg total) by mouth daily. For Investigational Use Only. Take at the same time each day (preferably in the morning). Tablet should be swallowed whole with water; it CANNOT be crushed or broken. Please contact [facility] for any questions or concerns regarding this medication. 196 tablet 0   No current facility-administered medications on file prior to visit.     ALLERGIES: No Known Allergies  FAMILY HISTORY: Family History  Problem Relation Age of Onset   Heart attack Mother    Heart disease Mother    Heart disease Father    Stroke Father       Objective:  *** General: No acute distress.  Patient appears well-groomed.   Head:  Normocephalic/atraumatic Neck:  Supple.  No paraspinal tenderness.  Full range of motion. Heart:  Regular rate and rhythm. Neuro:  Alert and oriented.  Speech fluent and not dysarthric.  Language intact.  CN II-XII intact.  Bulk and tone normal.  Muscle strength 5/5 throughout.  Sensation to light touch intact.  Deep tendon reflexes  2+ throughout, toes downgoing.  Gait normal.  Romberg negative.    Juliene Dunnings, DO  CC: Elsie Lesches, MD

## 2024-01-23 ENCOUNTER — Encounter: Payer: Self-pay | Admitting: Neurology

## 2024-01-23 ENCOUNTER — Ambulatory Visit: Admitting: Neurology

## 2024-01-23 VITALS — BP 126/79 | HR 77 | Ht 70.0 in | Wt 190.0 lb

## 2024-01-23 DIAGNOSIS — R519 Headache, unspecified: Secondary | ICD-10-CM

## 2024-01-23 DIAGNOSIS — I63511 Cerebral infarction due to unspecified occlusion or stenosis of right middle cerebral artery: Secondary | ICD-10-CM

## 2024-01-23 NOTE — Patient Instructions (Signed)
 Stop nortriptyline  for now.  If headaches return, contact me and we can restart it.

## 2024-01-29 ENCOUNTER — Other Ambulatory Visit: Payer: Self-pay | Admitting: Neurology

## 2024-02-06 DIAGNOSIS — Z206 Contact with and (suspected) exposure to human immunodeficiency virus [HIV]: Secondary | ICD-10-CM | POA: Diagnosis not present

## 2024-02-06 DIAGNOSIS — Z202 Contact with and (suspected) exposure to infections with a predominantly sexual mode of transmission: Secondary | ICD-10-CM | POA: Diagnosis not present

## 2024-02-06 DIAGNOSIS — Z113 Encounter for screening for infections with a predominantly sexual mode of transmission: Secondary | ICD-10-CM | POA: Diagnosis not present

## 2024-02-06 DIAGNOSIS — Z114 Encounter for screening for human immunodeficiency virus [HIV]: Secondary | ICD-10-CM | POA: Diagnosis not present

## 2024-03-13 DIAGNOSIS — Z125 Encounter for screening for malignant neoplasm of prostate: Secondary | ICD-10-CM | POA: Diagnosis not present

## 2024-03-13 DIAGNOSIS — G4733 Obstructive sleep apnea (adult) (pediatric): Secondary | ICD-10-CM | POA: Diagnosis not present

## 2024-03-13 DIAGNOSIS — I10 Essential (primary) hypertension: Secondary | ICD-10-CM | POA: Diagnosis not present

## 2024-03-13 DIAGNOSIS — E7800 Pure hypercholesterolemia, unspecified: Secondary | ICD-10-CM | POA: Diagnosis not present

## 2024-03-13 DIAGNOSIS — Z0000 Encounter for general adult medical examination without abnormal findings: Secondary | ICD-10-CM | POA: Diagnosis not present

## 2024-03-13 DIAGNOSIS — I679 Cerebrovascular disease, unspecified: Secondary | ICD-10-CM | POA: Diagnosis not present

## 2024-03-13 DIAGNOSIS — G4489 Other headache syndrome: Secondary | ICD-10-CM | POA: Diagnosis not present

## 2024-03-13 DIAGNOSIS — K219 Gastro-esophageal reflux disease without esophagitis: Secondary | ICD-10-CM | POA: Diagnosis not present

## 2024-03-13 DIAGNOSIS — G5712 Meralgia paresthetica, left lower limb: Secondary | ICD-10-CM | POA: Diagnosis not present

## 2025-01-24 ENCOUNTER — Ambulatory Visit: Admitting: Neurology
